# Patient Record
Sex: Male | Born: 1940
Health system: Southern US, Community
[De-identification: ages and names within clinical notes are randomized; demographics above are authoritative.]

## PROBLEM LIST (undated history)

## (undated) DIAGNOSIS — B182 Chronic viral hepatitis C: Secondary | ICD-10-CM

## (undated) DIAGNOSIS — Z87442 Personal history of urinary calculi: Secondary | ICD-10-CM

## (undated) DIAGNOSIS — I10 Essential (primary) hypertension: Secondary | ICD-10-CM

## (undated) DIAGNOSIS — D132 Benign neoplasm of duodenum: Secondary | ICD-10-CM

## (undated) DIAGNOSIS — Z973 Presence of spectacles and contact lenses: Secondary | ICD-10-CM

## (undated) DIAGNOSIS — E039 Hypothyroidism, unspecified: Secondary | ICD-10-CM

## (undated) DIAGNOSIS — E785 Hyperlipidemia, unspecified: Secondary | ICD-10-CM

## (undated) HISTORY — PX: JOINT REPLACEMENT: SHX530

## (undated) HISTORY — PX: LIVER BIOPSY: SHX301

## (undated) HISTORY — DX: Essential (primary) hypertension: I10

## (undated) HISTORY — DX: Chronic viral hepatitis C: B18.2

## (undated) HISTORY — DX: Hyperlipidemia, unspecified: E78.5

## (undated) HISTORY — PX: VASECTOMY: SHX75

## (undated) HISTORY — PX: OTHER SURGICAL HISTORY: SHX169

## (undated) HISTORY — PX: PROSTATE BIOPSY: SHX241

---
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
## 1983-01-19 HISTORY — PX: TOE FUSION: SHX1070

## 2009-11-27 DIAGNOSIS — Z8249 Family history of ischemic heart disease and other diseases of the circulatory system: Secondary | ICD-10-CM | POA: Insufficient documentation

## 2009-11-27 DIAGNOSIS — B1920 Unspecified viral hepatitis C without hepatic coma: Secondary | ICD-10-CM | POA: Insufficient documentation

## 2009-11-27 DIAGNOSIS — I2510 Atherosclerotic heart disease of native coronary artery without angina pectoris: Secondary | ICD-10-CM | POA: Insufficient documentation

## 2010-04-19 LAB — HM COLONOSCOPY

## 2011-08-14 DIAGNOSIS — IMO0001 Reserved for inherently not codable concepts without codable children: Secondary | ICD-10-CM | POA: Diagnosis not present

## 2011-10-06 ENCOUNTER — Ambulatory Visit (INDEPENDENT_AMBULATORY_CARE_PROVIDER_SITE_OTHER): Payer: Medicare Other | Admitting: Internal Medicine

## 2011-10-06 ENCOUNTER — Encounter: Payer: Self-pay | Admitting: Internal Medicine

## 2011-10-06 VITALS — BP 126/70 | HR 49 | Temp 98.4°F | Ht 74.0 in | Wt 230.0 lb

## 2011-10-06 DIAGNOSIS — I10 Essential (primary) hypertension: Secondary | ICD-10-CM | POA: Diagnosis not present

## 2011-10-06 DIAGNOSIS — B182 Chronic viral hepatitis C: Secondary | ICD-10-CM | POA: Diagnosis not present

## 2011-10-06 DIAGNOSIS — Z23 Encounter for immunization: Secondary | ICD-10-CM | POA: Diagnosis not present

## 2011-10-06 DIAGNOSIS — Z8619 Personal history of other infectious and parasitic diseases: Secondary | ICD-10-CM | POA: Insufficient documentation

## 2011-10-06 DIAGNOSIS — E785 Hyperlipidemia, unspecified: Secondary | ICD-10-CM | POA: Diagnosis not present

## 2011-10-06 DIAGNOSIS — N200 Calculus of kidney: Secondary | ICD-10-CM | POA: Insufficient documentation

## 2011-10-06 DIAGNOSIS — R972 Elevated prostate specific antigen [PSA]: Secondary | ICD-10-CM | POA: Insufficient documentation

## 2011-10-06 DIAGNOSIS — R030 Elevated blood-pressure reading, without diagnosis of hypertension: Secondary | ICD-10-CM | POA: Insufficient documentation

## 2011-10-06 LAB — HEPATIC FUNCTION PANEL
AST: 37 U/L (ref 0–37)
Albumin: 3.9 g/dL (ref 3.5–5.2)

## 2011-10-06 LAB — CBC WITH DIFFERENTIAL/PLATELET
Basophils Absolute: 0 10*3/uL (ref 0.0–0.1)
Eosinophils Absolute: 0.1 10*3/uL (ref 0.0–0.7)
Eosinophils Relative: 1.5 % (ref 0.0–5.0)
HCT: 47.9 % (ref 39.0–52.0)
Lymphs Abs: 2 10*3/uL (ref 0.7–4.0)
MCHC: 33.1 g/dL (ref 30.0–36.0)
MCV: 95.2 fl (ref 78.0–100.0)
Monocytes Absolute: 0.6 10*3/uL (ref 0.1–1.0)
Neutrophils Relative %: 56.3 % (ref 43.0–77.0)
Platelets: 155 10*3/uL (ref 150.0–400.0)
RDW: 13.4 % (ref 11.5–14.6)

## 2011-10-06 LAB — LIPID PANEL
Cholesterol: 143 mg/dL (ref 0–200)
Triglycerides: 59 mg/dL (ref 0.0–149.0)

## 2011-10-06 LAB — BASIC METABOLIC PANEL
BUN: 19 mg/dL (ref 6–23)
Chloride: 104 mEq/L (ref 96–112)
Creatinine, Ser: 1 mg/dL (ref 0.4–1.5)
Glucose, Bld: 100 mg/dL — ABNORMAL HIGH (ref 70–99)
Potassium: 5 mEq/L (ref 3.5–5.1)

## 2011-10-06 LAB — TSH: TSH: 2.54 u[IU]/mL (ref 0.35–5.50)

## 2011-10-06 NOTE — Assessment & Plan Note (Signed)
2 negative biopsies No further evaluation of any kind

## 2011-10-06 NOTE — Progress Notes (Signed)
Subjective:    Patient ID: Victor Ball, male    DOB: 1940/12/28, 71 y.o.   MRN: [id number]  HPI Moved here from [facility] there for 10 years after retiring from [facility] [employer] in [place] and [place] [employer] golf and tennis twice a week Remains very active  HTN goes back 10 years He wonders about need for meds Feels well No problems with meds  Hypercholesterolemia for some time Satisfied with the simvastatin Total 138 and LDL 67 about a year ago  Elevated PSA Biopsy done 18 months ago This was the second one----both were negative  Hepatitis C diagnosed in 1992 when he went to give blood (type 1) Never had symptoms Biopsies were reassuring twice Was not treated (though evaluated by specialist) Had stopped alcohol for 10 years---now back drinking 1glass of wine nightly  Kidney stone --had one about 4 years ago Passed but never got stone No recurrence  Current Outpatient Prescriptions on File Prior to Visit  Medication Sig Dispense Refill  . lisinopril (PRINIVIL,ZESTRIL) 10 MG tablet Take 10 mg by mouth daily.      . simvastatin (ZOCOR) 20 MG tablet Take 20 mg by mouth every evening.        No Known Allergies  Past Medical History  Diagnosis Date  . Hypertension   . Hyperlipidemia   . Kidney stone ~2009    never retrieved stone  . Hepatitis C, chronic     Past Surgical History  Procedure Date  . Appendectomy 1948  . Tonsillectomy and adenoidectomy 1945  . Toe fusion 1985    left great toe    Family History  Problem Relation Age of Onset  . Heart disease Father   . Diabetes Father   . Celiac disease Sister   . Stroke Brother   . Cancer Paternal Aunt   . Celiac disease Brother     History   Social History  . Marital Status: Married    Spouse Name: N/A    Number of Children: 2  . Years of Education: N/A   Occupational History  . 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 Smoking status: Former Smoker    Quit date: 01/18/1973  . Smokeless tobacco: Never Used  . Alcohol Use: Yes  . Drug Use: No  . Sexually Active: Not on file   Other Topics Concern  . Not on file   Social History Narrative   2 daughtersHas living willWife, then daughter, has health care POA.Would accept resuscitation attemptsNot sure about tube feeds   Review of Systems  Constitutional: Negative for fatigue and unexpected weight change.       Wears seat belt  HENT: Negative for hearing loss, dental problem and tinnitus.        Has needed some crowns removed---left teeth out Regular with dentist  Eyes: Negative for visual disturbance.       No diplopia or unilateral vision loss  Respiratory: Negative for cough, chest tightness and shortness of breath.   Cardiovascular: Negative for chest pain, palpitations and leg swelling.  Gastrointestinal: Negative for nausea, vomiting, abdominal pain, constipation and blood in stool.       Occ chokes on saliva when in recliner  Genitourinary: Negative for dysuria, frequency and difficulty urinating.       No sexual problems  Musculoskeletal: Negative for back pain, joint swelling and arthralgias.  Skin: Negative for rash.  Wife makes him go for yearly dermatology checks  Had ?spider bite in left groin 3 months ago---Rx with doxy Still not completely healed  Neurological: Negative for dizziness, syncope, weakness, light-headedness, numbness and headaches.  Psychiatric/Behavioral: Negative for disturbed wake/sleep cycle and dysphoric mood. The patient is not nervous/anxious.        Objective:   Physical Exam  Constitutional: He is oriented to person, place, and time. He appears well-developed and well-nourished. No distress.  HENT:  Mouth/Throat: Oropharynx is clear and moist. No oropharyngeal exudate.  Eyes: Conjunctivae normal and EOM are normal. Pupils are equal, round, and reactive to light.   Neck: Normal range of motion. Neck supple. No thyromegaly present.  Cardiovascular: Normal rate, regular rhythm, normal heart sounds and intact distal pulses.  Exam reveals no gallop.   No murmur heard. Pulmonary/Chest: Effort normal and breath sounds normal. No respiratory distress. He has no wheezes. He has no rales.  Abdominal: Soft. There is no tenderness.  Musculoskeletal: Normal range of motion. He exhibits no edema and no tenderness.  Lymphadenopathy:    He has no cervical adenopathy.  Neurological: He is alert and oriented to person, place, and time.  Skin: No rash noted.       2-41mm indurated hyperpigmented area in left lateral inguinal area  Psychiatric: He has a normal mood and affect. His behavior is normal. Thought content normal.          Assessment & Plan:

## 2011-10-06 NOTE — Assessment & Plan Note (Signed)
BP Readings from Last 3 Encounters:  10/06/11 126/70   No problems with the med but is low May consider stopping

## 2011-10-06 NOTE — Assessment & Plan Note (Signed)
Biopsies have been negative twice Will recheck No further action appropriate

## 2011-10-06 NOTE — Assessment & Plan Note (Signed)
If the LDL is again well under 100, will have him try without the med

## 2011-10-12 ENCOUNTER — Telehealth: Payer: Self-pay

## 2011-10-12 NOTE — Telephone Encounter (Signed)
Pt left v/m; pt received flus shot and was charged $42.00 admin fee. Pt said medicare would not cover. Left v/m for pt to call back.

## 2011-10-13 NOTE — Telephone Encounter (Signed)
Pt given billing # [phone]; pt will call and discuss with billing.

## 2011-10-18 ENCOUNTER — Encounter: Payer: Self-pay | Admitting: *Deleted

## 2012-01-17 ENCOUNTER — Other Ambulatory Visit: Payer: Self-pay | Admitting: Family Medicine

## 2012-01-17 MED ORDER — LISINOPRIL 10 MG PO TABS: 10.0000 mg | ORAL_TABLET | Freq: Every day | ORAL | Status: DC

## 2012-01-17 NOTE — Telephone Encounter (Signed)
Okay to fill for a year 

## 2012-01-17 NOTE — Telephone Encounter (Signed)
rx sent to pharmacy by e-script  

## 2012-01-17 NOTE — Telephone Encounter (Signed)
Pt left vm stating that he needs new RX for Lisinopril 10mg  tab.  He was seen on 10/06/11 as a new pt.  Please advise.

## 2012-04-07 ENCOUNTER — Ambulatory Visit: Payer: PRIVATE HEALTH INSURANCE | Admitting: Internal Medicine

## 2012-04-21 ENCOUNTER — Encounter: Payer: Self-pay | Admitting: Internal Medicine

## 2012-04-21 ENCOUNTER — Ambulatory Visit (INDEPENDENT_AMBULATORY_CARE_PROVIDER_SITE_OTHER): Payer: Medicare Other | Admitting: Internal Medicine

## 2012-04-21 VITALS — BP 120/80 | HR 55 | Temp 98.0°F | Wt 239.0 lb

## 2012-04-21 DIAGNOSIS — I10 Essential (primary) hypertension: Secondary | ICD-10-CM | POA: Diagnosis not present

## 2012-04-21 DIAGNOSIS — E785 Hyperlipidemia, unspecified: Secondary | ICD-10-CM

## 2012-04-21 DIAGNOSIS — B182 Chronic viral hepatitis C: Secondary | ICD-10-CM

## 2012-04-21 DIAGNOSIS — Z1331 Encounter for screening for depression: Secondary | ICD-10-CM | POA: Diagnosis not present

## 2012-04-21 NOTE — Progress Notes (Signed)
Subjective:    Patient ID: Victor Ball, male    DOB: [birth year], 72 y.o.   MRN: [id number]  HPI Doing well Did stop his statin---happy about that  Having left leg pain for about 1 year Daughter suggested he take wallet out of pants---seems to have helped Plans to get back to regular exercise Does play indoor tennis at [employer] slacked off on this  Still interested in stopping BP med No headache No chest pain No SOB No exertional dyspnea---even doubles tennis for 2 hours  Discussed the hep C Has had 2 biopsies that didn't show major fibrosis Avoids tylenol Only 1 glass of wine daily now  Current Outpatient Prescriptions on File Prior to Visit  Medication Sig Dispense Refill  . aspirin 81 MG tablet Take 81 mg by mouth daily.      . fish oil-omega-3 fatty acids 1000 MG capsule Take 2 g by mouth daily.      Marland Kitchen lisinopril (PRINIVIL,ZESTRIL) 10 MG tablet Take 1 tablet (10 mg total) by mouth daily.  90 tablet  3  . Multiple Vitamins-Minerals (MATURE ADULT CENTURY PO) Take by mouth daily.       No current facility-administered medications on file prior to visit.    No Known Allergies  Past Medical History  Diagnosis Date  . Hypertension   . Hyperlipidemia   . Kidney stone ~2009    never retrieved stone  . Hepatitis C, chronic     Past Surgical History  Procedure Laterality Date  . Appendectomy  1948  . Tonsillectomy and adenoidectomy  1945  . Toe fusion  1985    left great toe  . Vasectomy      Family History  Problem Relation Age of Onset  . Heart disease Father   . Diabetes Father   . Celiac disease Sister   . Stroke Brother   . Cancer Paternal Aunt   . Celiac disease Brother     History   Social History  . Marital Status: Married    Spouse Name: N/A    Number of Children: 2  . Years of Education: N/A   Occupational History  . 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 Smoking  status: Former Smoker    Quit date: 01/18/1973  . Smokeless tobacco: Never Used  . Alcohol Use: Yes  . Drug Use: No  . Sexually Active: Not on file   Other Topics Concern  . Not on file   Social History Narrative   2 daughters      Has living will   Wife, then daughter, has health care POA.   Would accept resuscitation attempts   Not sure about tube feeds   Review of Systems Gained 9#--was just in [place] and got "wined and dined" Hasn't been exercising as much---relates to less golf with weather and leg pain    Objective:   Physical Exam  Constitutional: He appears well-developed and well-nourished. No distress.  Neck: Normal range of motion. Neck supple. No thyromegaly present.  Cardiovascular: Normal rate, regular rhythm, normal heart sounds and intact distal pulses.  Exam reveals no gallop.   No murmur heard. Pulmonary/Chest: Effort normal and breath sounds normal. No respiratory distress. He has no wheezes. He has no rales.  Abdominal: Soft. There is no tenderness.  Musculoskeletal: He exhibits no edema and no tenderness.  Lymphadenopathy:    He has no cervical adenopathy.  Psychiatric: He has a  normal mood and affect. His behavior is normal.          Assessment & Plan:

## 2012-04-21 NOTE — Assessment & Plan Note (Signed)
BP Readings from Last 3 Encounters:  04/21/12 120/80  10/06/11 126/70   Good control still Will try off the med Discussed fitness  Some left leg pain--- will refer to PT if persist

## 2012-04-21 NOTE — Assessment & Plan Note (Signed)
Off the med now Will recheck labs at next visit

## 2012-04-21 NOTE — Assessment & Plan Note (Signed)
2 biopsies without sig fibrosis If LFTs stay okay, would not do any further testing

## 2012-04-28 ENCOUNTER — Ambulatory Visit: Payer: PRIVATE HEALTH INSURANCE | Admitting: Internal Medicine

## 2012-06-07 ENCOUNTER — Telehealth: Payer: Self-pay | Admitting: Internal Medicine

## 2012-06-07 ENCOUNTER — Ambulatory Visit (INDEPENDENT_AMBULATORY_CARE_PROVIDER_SITE_OTHER): Payer: Medicare Other | Admitting: Family Medicine

## 2012-06-07 ENCOUNTER — Encounter: Payer: Self-pay | Admitting: Family Medicine

## 2012-06-07 VITALS — BP 146/86 | HR 60 | Temp 98.2°F | Ht 74.0 in | Wt 227.8 lb

## 2012-06-07 DIAGNOSIS — J209 Acute bronchitis, unspecified: Secondary | ICD-10-CM

## 2012-06-07 MED ORDER — AZITHROMYCIN 250 MG PO TABS: ORAL_TABLET | ORAL | Status: DC

## 2012-06-07 MED ORDER — ALBUTEROL SULFATE HFA 108 (90 BASE) MCG/ACT IN AERS: 2.0000 | INHALATION_SPRAY | RESPIRATORY_TRACT | Status: DC | PRN

## 2012-06-07 MED ORDER — PREDNISONE 10 MG PO TABS: ORAL_TABLET | ORAL | Status: DC

## 2012-06-07 NOTE — Telephone Encounter (Signed)
Patient Information:  Caller Name: Argel  Phone: [phone]  Patient: Lane, Eland  Gender: Male  DOB: 02/20/1940  Age: 72 Years  PCP: Tillman Abide [facility])  Office Follow Up:  Does the office need to follow up with this patient?: No  Instructions For The Office: N/A  RN Note:  Patient states he developed cough, congestion. Spouse reports that patient has wheezing at night. States he feels like he developed a fever, per tactile, 06/06/12. Patient does not have a thermometer. States expectorating yellow sputum. Patient able to speak in full sentences without difficulty. Care advice given per guidelines. Call back parameters reviewed. Patient verbalizes understanding.  Symptoms  Reason For Call & Symptoms: Cough, congestion, wheezing  Reviewed Health History In EMR: Yes  Reviewed Medications In EMR: Yes  Reviewed Allergies In EMR: Yes  Reviewed Surgeries / Procedures: Yes  Date of Onset of Symptoms: 05/31/2012  Treatments Tried: Tussin DM q 4 hours  Treatments Tried Worked: No  Any Fever: Yes  Fever Taken: Tactile  Fever Time Of Reading: 08:00:00  Fever Last Reading: N/A  Guideline(s) Used:  Colds  Disposition Per Guideline:   See Today or Tomorrow in Office  Reason For Disposition Reached:   Patient wants to be seen  Advice Given:  Humidifier:  If the air in your home is dry, use a cool-mist humidifier  Treatment for Associated Symptoms of Colds:  Hydrate: Drink adequate liquids.  Call Back If:  Difficulty breathing occurs  You become worse  Patient Will Follow Care Advice:  YES  Appointment Scheduled:  06/07/2012 15:15:00 Appointment Scheduled Provider:  R[facility])

## 2012-06-07 NOTE — Progress Notes (Signed)
Subjective:    Patient ID: Victor Ball, male    DOB: [birth year], 72 y.o.   MRN: [id number]  HPI Here with uri symptoms   He is generally very healthy  Last week this started with chest congestion- noticed some cough - stayed active all week  By Sunday stared to feel tired out  Monday - wheezed when he slept (per wife)- and this is unusual for him  Felt feverish - had sweats at 3 am   Phlegm he coughs up is clear to yellow - small amount  Just won't loosen up  He tried some tussin DM   As a kid he had bronchitis at least once per year -no asthma   Some post nasal drip   Patient Active Problem List   Diagnosis Date Noted  . Elevated PSA 10/06/2011  . Hypertension   . Hyperlipidemia   . Kidney stone   . Hepatitis C, chronic    Past Medical History  Diagnosis Date  . Hypertension   . Hyperlipidemia   . Kidney stone ~2009    never retrieved stone  . Hepatitis C, chronic    Past Surgical History  Procedure Laterality Date  . Appendectomy  1948  . Tonsillectomy and adenoidectomy  1945  . Toe fusion  1985    left great toe  . Vasectomy     History  Substance Use Topics  . Smoking status: Former Smoker    Quit date: 01/18/1973  . Smokeless tobacco: Never Used  . Alcohol Use: Yes     Comment: occ   Family History  Problem Relation Age of Onset  . Heart disease Father   . Diabetes Father   . Celiac disease Sister   . Stroke Brother   . Cancer Paternal Aunt   . Celiac disease Brother    No Known Allergies Current Outpatient Prescriptions on File Prior to Visit  Medication Sig Dispense Refill  . aspirin 81 MG tablet Take 81 mg by mouth daily.      . fish oil-omega-3 fatty acids 1000 MG capsule Take 2 g by mouth daily.      . Multiple Vitamins-Minerals (MATURE ADULT CENTURY PO) Take by mouth daily.       No current facility-administered medications on file prior to visit.     Review of Systems Review of Systems  Constitutional: Negative for fever, appetite  change,  and unexpected weight change.  ENT pos for post nasal drip/ rhinorrhea/ neg for ear pain or sinus pain  Eyes: Negative for pain and visual disturbance.  Respiratory: Negative for  shortness of breath.   Cardiovascular: Negative for cp or palpitations    Gastrointestinal: Negative for nausea, diarrhea and constipation.  Genitourinary: Negative for urgency and frequency.  Skin: Negative for pallor or rash   Neurological: Negative for weakness, light-headedness, numbness and headaches.  Hematological: Negative for adenopathy. Does not bruise/bleed easily.  Psychiatric/Behavioral: Negative for dysphoric mood. The patient is not nervous/anxious.         Objective:   Physical Exam  Constitutional: He appears well-developed and well-nourished. No distress.  HENT:  Head: Normocephalic and atraumatic.  Right Ear: External ear normal.  Left Ear: External ear normal.  Mouth/Throat: Oropharynx is clear and moist.  Nares are boggy  Clear post nasal drip  No sinus tenderness  Eyes: Conjunctivae and EOM are normal. Pupils are equal, round, and reactive to light. Right eye exhibits no discharge. Left eye exhibits no discharge. No scleral icterus.  Neck: Normal  range of motion. Neck supple. No JVD present. Carotid bruit is not present. No thyromegaly present.  Cardiovascular: Normal rate, regular rhythm, normal heart sounds and intact distal pulses.  Exam reveals no gallop.   Pulmonary/Chest: Effort normal. No respiratory distress. He has wheezes. He has no rales. He exhibits no tenderness.  Diffuse exp wheeze with few rhonchi No prolonged exp time  Abdominal: He exhibits no abdominal bruit.  Lymphadenopathy:    He has no cervical adenopathy.  Skin: Skin is warm and dry. No rash noted.  Psychiatric: He has a normal mood and affect.          Assessment & Plan:

## 2012-06-07 NOTE — Telephone Encounter (Signed)
I will see him then

## 2012-06-07 NOTE — Assessment & Plan Note (Signed)
Cover with zithromax as directed  Prednisone 30 mg taper for wheeze and albuterol MDI -- info given on technique and this was reviewed  Adv to get mucinex DM for cough  Disc symptomatic care - see instructions on AVS  Update if not starting to improve in a week or if worsening

## 2012-06-07 NOTE — Patient Instructions (Addendum)
Drink lots of fluids and get some extra rest  Take the zithromax as directed for bronchitis Take prednisone as directed for wheezing -taper the dose as directed  Albuterol inhaler- you can use as needed  mucinex DM over the counter is good for cough and congestion Update if not starting to improve in a week or if worsening

## 2012-06-13 ENCOUNTER — Encounter: Payer: Self-pay | Admitting: Internal Medicine

## 2012-06-13 ENCOUNTER — Ambulatory Visit (INDEPENDENT_AMBULATORY_CARE_PROVIDER_SITE_OTHER): Payer: Medicare Other | Admitting: Internal Medicine

## 2012-06-13 ENCOUNTER — Telehealth: Payer: Self-pay | Admitting: Internal Medicine

## 2012-06-13 ENCOUNTER — Ambulatory Visit (INDEPENDENT_AMBULATORY_CARE_PROVIDER_SITE_OTHER)
Admission: RE | Admit: 2012-06-13 | Discharge: 2012-06-13 | Disposition: A | Discharge status: Home / Self Care | Payer: Medicare Other | Source: Ambulatory Visit | Attending: Internal Medicine | Admitting: Internal Medicine

## 2012-06-13 VITALS — BP 130/80 | HR 60 | Temp 97.9°F | Resp 10 | Wt 227.0 lb

## 2012-06-13 DIAGNOSIS — J209 Acute bronchitis, unspecified: Secondary | ICD-10-CM

## 2012-06-13 DIAGNOSIS — R062 Wheezing: Secondary | ICD-10-CM | POA: Diagnosis not present

## 2012-06-13 DIAGNOSIS — R05 Cough: Secondary | ICD-10-CM | POA: Diagnosis not present

## 2012-06-13 DIAGNOSIS — R059 Cough, unspecified: Secondary | ICD-10-CM | POA: Diagnosis not present

## 2012-06-13 MED ORDER — PREDNISONE 20 MG PO TABS: 40.0000 mg | ORAL_TABLET | Freq: Every day | ORAL | Status: DC

## 2012-06-13 MED ORDER — DOXYCYCLINE HYCLATE 100 MG PO TABS: 100.0000 mg | ORAL_TABLET | Freq: Two times a day (BID) | ORAL | Status: DC

## 2012-06-13 NOTE — Telephone Encounter (Signed)
Will evaluate at OV 

## 2012-06-13 NOTE — Assessment & Plan Note (Addendum)
Persistent tight cough CXR appears normal---waiting for radiologist  Will try prednisone at higher dose Doxycycline as well

## 2012-06-13 NOTE — Progress Notes (Signed)
Subjective:    Patient ID: Victor Ball, male    DOB: [birth year], 72 y.o.   MRN: [id number]  HPI First time he has been sick in 15 years Fought off the infection for 6 days---then finally came in last week Finished the azithromycin-- still using inhaler and prednisone  May have improved slightly after 4 days but then persisting Has been taking it easy Tight cough with occasional yellow sputum  No SOB Some wheezing at night---wife notices No fever but occ sweats  Current Outpatient Prescriptions on File Prior to Visit  Medication Sig Dispense Refill  . albuterol (PROVENTIL HFA;VENTOLIN HFA) 108 (90 BASE) MCG/ACT inhaler Inhale 2 puffs into the lungs every 4 (four) hours as needed for wheezing.  1 Inhaler  1  . aspirin 81 MG tablet Take 81 mg by mouth daily.      . fish oil-omega-3 fatty acids 1000 MG capsule Take 2 g by mouth daily.      . Multiple Vitamins-Minerals (MATURE ADULT CENTURY PO) Take by mouth daily.      . predniSONE (DELTASONE) 10 MG tablet Take 3 pills once daily by mouth for 3 days, then 2 pills once daily for 3 days, then 1 pill once daily for 3 days and then stop  18 tablet  0   No current facility-administered medications on file prior to visit.    No Known Allergies  Past Medical History  Diagnosis Date  . Hypertension   . Hyperlipidemia   . Kidney stone ~2009    never retrieved stone  . Hepatitis C, chronic     Past Surgical History  Procedure Laterality Date  . Appendectomy  1948  . Tonsillectomy and adenoidectomy  1945  . Toe fusion  1985    left great toe  . Vasectomy      Family History  Problem Relation Age of Onset  . Heart disease Father   . Diabetes Father   . Celiac disease Sister   . Stroke Brother   . Cancer Paternal Aunt   . Celiac disease Brother     History   Social History  . Marital Status: Married    Spouse Name: N/A    Number of Children: 2  . Years of Education: N/A   Occupational History  . 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 Smoking status: Former Smoker    Quit date: 01/18/1973  . Smokeless tobacco: Never Used  . Alcohol Use: Yes     Comment: occ  . Drug Use: No  . Sexually Active: Not on file   Other Topics Concern  . Not on file   Social History Narrative   2 daughters      Has living will   Wife, then daughter, has health care POA.   Would accept resuscitation attempts   Not sure about tube feeds   Review of Systems No abdominal pain No vomiting or diarrhea Appetite is still okay No heartburn     Objective:   Physical Exam  Constitutional: He appears well-developed and well-nourished. No distress.  Coarse tight cough intermittently  HENT:  Nose: Nose normal.  Mouth/Throat: Oropharynx is clear and moist. No oropharyngeal exudate.  TMs normal  Neck: Normal range of motion. Neck supple. No thyromegaly present.  Pulmonary/Chest: Effort normal. No respiratory distress. He has wheezes. He has no rales.  Slight expiratory wheezes in upper lobes  Lymphadenopathy:    He has  no cervical adenopathy.          Assessment & Plan:

## 2012-06-13 NOTE — Telephone Encounter (Signed)
Patient Information:  Caller Name: Landy  Phone: [phone]  Patient: Victor Ball, Victor Ball  Gender: Male  DOB: 10/09/1940  Age: 72 Years  PCP: Tillman Abide [facility])  Office Follow Up:  Does the office need to follow up with this patient?: No  Instructions For The Office: N/A   Symptoms  Reason For Call & Symptoms: Patient reports he was seen 06/07/12, diagnosed with bronchitis; on Azithromycin, Prednisone,  Albuterol Inhaler, Mucinex DM, Cough Syrup without relief.  Yellow sputum persists.  Emergent symptoms ruled out.  Go to Office Now per Cough protocol.  Reviewed Health History In EMR: Yes  Reviewed Medications In EMR: Yes  Reviewed Allergies In EMR: Yes  Reviewed Surgeries / Procedures: Yes  Date of Onset of Symptoms: 06/01/2012  Treatments Tried: As ordered  Treatments Tried Worked: No  Guideline(s) Used:  Cough  Disposition Per Guideline:   Go to Office Now  Reason For Disposition Reached:   Wheezing is present  Advice Given:  Reassurance  Coughing is the way that our lungs remove irritants and mucus. It helps protect our lungs from getting pneumonia.  Coughing Spasms:  Drink warm fluids. Inhale warm mist (Reason: both relax the airway and loosen up the phlegm).  Suck on cough drops or hard candy to coat the irritated throat.  Prevent Dehydration:  Drink adequate liquids.  Call Back If:  You become worse.  RN Overrode Recommendation:  Make Appointment  See ASAP  Appointment Scheduled:  06/13/2012 10:15:00 Appointment Scheduled Provider:  Tillman Abide [facility])

## 2012-06-14 ENCOUNTER — Encounter: Payer: Self-pay | Admitting: *Deleted

## 2012-06-16 ENCOUNTER — Ambulatory Visit (INDEPENDENT_AMBULATORY_CARE_PROVIDER_SITE_OTHER): Payer: Medicare Other | Admitting: *Deleted

## 2012-06-16 DIAGNOSIS — Z23 Encounter for immunization: Secondary | ICD-10-CM

## 2012-07-05 ENCOUNTER — Other Ambulatory Visit: Payer: Self-pay | Admitting: Family Medicine

## 2012-10-04 ENCOUNTER — Ambulatory Visit (INDEPENDENT_AMBULATORY_CARE_PROVIDER_SITE_OTHER)
Admission: RE | Admit: 2012-10-04 | Discharge: 2012-10-04 | Disposition: A | Discharge status: Home / Self Care | Payer: Medicare Other | Source: Ambulatory Visit | Attending: Internal Medicine | Admitting: Internal Medicine

## 2012-10-04 ENCOUNTER — Encounter: Payer: Self-pay | Admitting: Internal Medicine

## 2012-10-04 ENCOUNTER — Ambulatory Visit (INDEPENDENT_AMBULATORY_CARE_PROVIDER_SITE_OTHER): Payer: Medicare Other | Admitting: Internal Medicine

## 2012-10-04 VITALS — BP 138/70 | HR 54 | Temp 98.2°F | Wt 228.0 lb

## 2012-10-04 DIAGNOSIS — Z23 Encounter for immunization: Secondary | ICD-10-CM | POA: Diagnosis not present

## 2012-10-04 DIAGNOSIS — M25559 Pain in unspecified hip: Secondary | ICD-10-CM | POA: Diagnosis not present

## 2012-10-04 DIAGNOSIS — M1610 Unilateral primary osteoarthritis, unspecified hip: Secondary | ICD-10-CM | POA: Diagnosis not present

## 2012-10-04 DIAGNOSIS — M169 Osteoarthritis of hip, unspecified: Secondary | ICD-10-CM | POA: Diagnosis not present

## 2012-10-04 DIAGNOSIS — M25552 Pain in left hip: Secondary | ICD-10-CM

## 2012-10-04 NOTE — Progress Notes (Signed)
  Subjective:    Patient ID: Stancil Deisher, male    DOB: 12-Feb-1940, 72 y.o.   MRN: [id number]  HPI Noted some pain along left leg ~18 months ago Took wallet out of back pocket and it seemed to get better  Now with pain at lateral left hip for 2-3 weeks Awakens in AM at times Still plays tennis and golf ---it may be present then but did get worse during golf yesterday Doesn't radiate  Hasn't been using ibuprofen---seems to get better on its own Tries to take breaks if he is driving Hasn't tried heat or ice  Current Outpatient Prescriptions on File Prior to Visit  Medication Sig Dispense Refill  . aspirin 81 MG tablet Take 81 mg by mouth daily.      . fish oil-omega-3 fatty acids 1000 MG capsule Take 2 g by mouth daily.      . Multiple Vitamins-Minerals (MATURE ADULT CENTURY PO) Take by mouth daily.       No current facility-administered medications on file prior to visit.    No Known Allergies  Past Medical History  Diagnosis Date  . Hypertension   . Hyperlipidemia   . Kidney stone ~2009    never retrieved stone  . Hepatitis C, chronic     Past Surgical History  Procedure Laterality Date  . Appendectomy  1948  . Tonsillectomy and adenoidectomy  1945  . Toe fusion  1985    left great toe  . Vasectomy      Family History  Problem Relation Age of Onset  . Heart disease Father   . Diabetes Father   . Celiac disease Sister   . Stroke Brother   . Cancer Paternal Aunt   . Celiac disease Brother     History   Social History  . Marital Status: Married    Spouse Name: N/A    Number of Children: 2  . Years of Education: N/A   Occupational History  . 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 Smoking status: Former Smoker    Quit date: 01/18/1973  . Smokeless tobacco: Never Used  . Alcohol Use: Yes     Comment: occ  . Drug Use: No  . Sexual Activity: Not on file   Other Topics Concern  . Not on file    Social History Narrative   2 daughters      Has living will   Wife, then daughter, has health care POA.   Would accept resuscitation attempts   Not sure about tube feeds   Review of Systems No weakness---but gets stabs of pain at times Back is fine     Objective:   Physical Exam  Constitutional: He appears well-developed and well-nourished. No distress.  Abdominal:  Tenderness in femoral area--just medial to anterior superior iliac spine  Musculoskeletal:  No left hip or bursa tenderness Normal ROM of hips No back tenderness          Assessment & Plan:

## 2012-10-04 NOTE — Addendum Note (Signed)
Addended by: Sueanne Margarita on: 10/04/2012 10:59 AM   Modules accepted: Orders

## 2012-10-04 NOTE — Patient Instructions (Signed)
Take it easy for 2 weeks. If the pain isn't better, call for referral to see a surgeon.

## 2012-10-04 NOTE — Assessment & Plan Note (Signed)
Doesn't seem to be orthopedic ?femoral hernia or strain  Will just check x-ray Consider surgical evaluation after 2 weeks of reduced activity (just walking instead of golf/tennis)

## 2012-10-24 ENCOUNTER — Encounter: Payer: Self-pay | Admitting: Internal Medicine

## 2012-10-24 ENCOUNTER — Ambulatory Visit (INDEPENDENT_AMBULATORY_CARE_PROVIDER_SITE_OTHER): Payer: Medicare Other | Admitting: Internal Medicine

## 2012-10-24 VITALS — BP 122/90 | HR 52 | Temp 97.4°F | Ht 74.0 in | Wt 230.0 lb

## 2012-10-24 DIAGNOSIS — B182 Chronic viral hepatitis C: Secondary | ICD-10-CM

## 2012-10-24 DIAGNOSIS — I10 Essential (primary) hypertension: Secondary | ICD-10-CM | POA: Diagnosis not present

## 2012-10-24 DIAGNOSIS — M25559 Pain in unspecified hip: Secondary | ICD-10-CM | POA: Diagnosis not present

## 2012-10-24 DIAGNOSIS — M25552 Pain in left hip: Secondary | ICD-10-CM

## 2012-10-24 DIAGNOSIS — E785 Hyperlipidemia, unspecified: Secondary | ICD-10-CM

## 2012-10-24 DIAGNOSIS — Z23 Encounter for immunization: Secondary | ICD-10-CM

## 2012-10-24 DIAGNOSIS — Z0000 Encounter for general adult medical examination without abnormal findings: Secondary | ICD-10-CM | POA: Diagnosis not present

## 2012-10-24 LAB — LIPID PANEL
HDL: 48.3 mg/dL (ref >39.00)
Triglycerides: 61 mg/dL (ref 0.0–149.0)

## 2012-10-24 LAB — HEPATIC FUNCTION PANEL
ALT: 57 U/L — ABNORMAL HIGH (ref 0–53)
Bilirubin, Direct: 0.1 mg/dL (ref 0.0–0.3)
Total Protein: 7.4 g/dL (ref 6.0–8.3)

## 2012-10-24 LAB — CBC WITH DIFFERENTIAL/PLATELET
Basophils Absolute: 0 10*3/uL (ref 0.0–0.1)
Basophils Relative: 0.4 % (ref 0.0–3.0)
Eosinophils Absolute: 0.1 10*3/uL (ref 0.0–0.7)
Hemoglobin: 17.3 g/dL — ABNORMAL HIGH (ref 13.0–17.0)
Lymphocytes Relative: 31.2 % (ref 12.0–46.0)
MCHC: 34 g/dL (ref 30.0–36.0)
MCV: 92.7 fl (ref 78.0–100.0)
Monocytes Absolute: 0.5 10*3/uL (ref 0.1–1.0)
Neutro Abs: 3.5 10*3/uL (ref 1.4–7.7)
Neutrophils Relative %: 58.1 % (ref 43.0–77.0)
RDW: 13.8 % (ref 11.5–14.6)

## 2012-10-24 LAB — BASIC METABOLIC PANEL
CO2: 30 mEq/L (ref 19–32)
Calcium: 9.4 mg/dL (ref 8.4–10.5)
Chloride: 106 mEq/L (ref 96–112)
Creatinine, Ser: 1 mg/dL (ref 0.4–1.5)
Glucose, Bld: 110 mg/dL — ABNORMAL HIGH (ref 70–99)

## 2012-10-24 NOTE — Patient Instructions (Signed)
Please set up an appointment with Dr Patsy Lager to evaluate your left hip

## 2012-10-24 NOTE — Assessment & Plan Note (Signed)
Just on fish oil Will recheck

## 2012-10-24 NOTE — Progress Notes (Signed)
Subjective:    Patient ID: Victor Ball, male    DOB: 06-Jan-1941, 72 y.o.   MRN: [id number]  HPI Here for Medicare wellness and follow up No falls No depression or anhedonia Reviewed advanced directives Occasional alcohol No nicotine No other doctors No hearing or vision problems No cognitive problems  Hip is no better Feels stiff and notices it when he puts his pants on in the morning Tried some ibuprofen-- maybe 4 times. No clear improvement Has abstained from golf and tennis---no improvement (this is when the pain was bad though) Ready for referral  Hasn't been taking his BP No headaches No chest pain  No SOB  Off the cholesterol med for a year Interested in what it is now  Current Outpatient Prescriptions on File Prior to Visit  Medication Sig Dispense Refill  . aspirin 81 MG tablet Take 81 mg by mouth daily.      . fish oil-omega-3 fatty acids 1000 MG capsule Take 2 g by mouth daily.      . Multiple Vitamins-Minerals (MATURE ADULT CENTURY PO) Take by mouth daily.       No current facility-administered medications on file prior to visit.    No Known Allergies  Past Medical History  Diagnosis Date  . Hypertension   . Hyperlipidemia   . Kidney stone ~2009    never retrieved stone  . Hepatitis C, chronic     Past Surgical History  Procedure Laterality Date  . Appendectomy  1948  . Tonsillectomy and adenoidectomy  1945  . Toe fusion  1985    left great toe  . Vasectomy      Family History  Problem Relation Age of Onset  . Heart disease Father   . Diabetes Father   . Celiac disease Sister   . Stroke Brother   . Heart disease Brother     MI then stents  . Cancer Paternal Aunt   . Celiac disease Brother     History   Social History  . Marital Status: Married    Spouse Name: N/A    Number of Children: 2  . Years of Education: N/A   Occupational History  . 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 Smoking status: Former Smoker    Quit date: 01/18/1973  . Smokeless tobacco: Never Used  . Alcohol Use: Yes     Comment: occ  . Drug Use: No  . Sexual Activity: Not on file   Other Topics Concern  . Not on file   Social History Narrative   2 daughters      Has living will   Wife, then daughter, has health care POA.   Would accept resuscitation attempts   Not sure about tube feeds   Review of Systems Sleeps well Weight is stable Bowels are fine    Objective:   Physical Exam  Constitutional: He is oriented to person, place, and time. He appears well-developed and well-nourished. No distress.  HENT:  Mouth/Throat: Oropharynx is clear and moist. No oropharyngeal exudate.  Neck: Normal range of motion. Neck supple. No thyromegaly present.  Cardiovascular: Normal rate, regular rhythm, normal heart sounds and intact distal pulses.  Exam reveals no gallop.   No murmur heard. Pulmonary/Chest: Effort normal and breath sounds normal. No respiratory distress. He has no wheezes. He has no rales.  Abdominal: Soft. There is no tenderness.  Musculoskeletal: He exhibits no edema and no  tenderness.  Lymphadenopathy:    He has no cervical adenopathy.  Neurological: He is alert and oriented to person, place, and time.  President-- "Thomasene Mohair, [place]" [id number] Recall 3/3  Skin: No rash noted. No erythema.  Psychiatric: He has a normal mood and affect. His behavior is normal.          Assessment & Plan:

## 2012-10-24 NOTE — Assessment & Plan Note (Signed)
Persists Not clear if this is arthritic Will set up with Dr Patsy Lager

## 2012-10-24 NOTE — Assessment & Plan Note (Signed)
I have personally reviewed the Medicare Annual Wellness questionnaire and have noted 1. The patient's medical and social history 2. Their use of alcohol, tobacco or illicit drugs 3. Their current medications and supplements 4. The patient's functional ability including ADL's, fall risks, home safety risks and hearing or visual             impairment. 5. Diet and physical activities 6. Evidence for depression or mood disorders  The patients weight, height, BMI and visual acuity have been recorded in the chart I have made referrals, counseling and provided education to the patient based review of the above and I have provided the pt with a written personalized care plan for preventive services.  I have provided you with a copy of your personalized plan for preventive services. Please take the time to review along with your updated medication list.  Doing well Will update Td prevnar next visit (just had flu shot)

## 2012-10-24 NOTE — Addendum Note (Signed)
Addended by: Sueanne Margarita on: 10/24/2012 11:51 AM   Modules accepted: Orders

## 2012-10-24 NOTE — Assessment & Plan Note (Signed)
BP Readings from Last 3 Encounters:  10/24/12 122/90  10/04/12 138/70  06/13/12 130/80   Doing fine without meds

## 2012-10-24 NOTE — Assessment & Plan Note (Signed)
Will check liver tests and viral load

## 2012-10-25 LAB — HEPATITIS C RNA QUANTITATIVE
HCV Quantitative Log: 6.19 {Log} — ABNORMAL HIGH (ref <1.18)
HCV Quantitative: 1540740 IU/mL — ABNORMAL HIGH (ref <15)

## 2012-10-27 ENCOUNTER — Encounter: Payer: Medicare Other | Admitting: Internal Medicine

## 2012-10-30 ENCOUNTER — Telehealth: Payer: Self-pay | Admitting: Internal Medicine

## 2012-10-30 ENCOUNTER — Ambulatory Visit (INDEPENDENT_AMBULATORY_CARE_PROVIDER_SITE_OTHER): Payer: Medicare Other | Admitting: Family Medicine

## 2012-10-30 ENCOUNTER — Encounter: Payer: Self-pay | Admitting: Family Medicine

## 2012-10-30 VITALS — BP 142/80 | HR 57 | Temp 98.2°F | Ht 74.0 in | Wt 237.2 lb

## 2012-10-30 DIAGNOSIS — M76899 Other specified enthesopathies of unspecified lower limb, excluding foot: Secondary | ICD-10-CM

## 2012-10-30 DIAGNOSIS — M169 Osteoarthritis of hip, unspecified: Secondary | ICD-10-CM | POA: Diagnosis not present

## 2012-10-30 DIAGNOSIS — M7062 Trochanteric bursitis, left hip: Secondary | ICD-10-CM

## 2012-10-30 DIAGNOSIS — M160 Bilateral primary osteoarthritis of hip: Secondary | ICD-10-CM

## 2012-10-30 DIAGNOSIS — M1610 Unilateral primary osteoarthritis, unspecified hip: Secondary | ICD-10-CM

## 2012-10-30 NOTE — Patient Instructions (Signed)
REFERRAL: GO THE THE FRONT ROOM AT THE ENTRANCE OF OUR CLINIC, NEAR CHECK IN. ASK FOR MARION. SHE WILL HELP YOU SET UP YOUR REFERRAL. DATE: TIME:  

## 2012-10-30 NOTE — Progress Notes (Signed)
Date:  10/30/2012   Name:  Victor Ball   DOB:  1940-11-06   MRN:  [id number] Gender: male Age: 72 y.o.  Primary Physician: Tillman Abide, MD  Dear Dr. Alphonsus Sias,  Thank you for having me see Victor Ball in consultation today at [facility] at [facility] for his problem with left greater than right hip pain..  As you may recall, he is a 72 y.o. year old male with a history of hepatitis C, and he is quite active in a routine bike rider as well as a walker and hiker. He has had some persistent lateral hip pain on the left.  Approximately 2 years ago, he moved to this area from [place], and he was having some sciatica on the left side. When he took out his wallet on the left side, his sciatica symptoms resolved.  Currently he is primarily having pain on the lateral aspect of his hip to near the greater trochanter. He denies groin pain. He denies much significant back pain. He is limited somewhat right now in his ability to hike in particular. He also plays some tennis, and his hip is limited in this as well.   Likes to hike, walk, he is a bike rider.   Left hip. Never had any issues with left hip until about 2 years ago when moved from [place] to [place]. He would get some sciatica. Took out his wallet.  Past Medical History  Diagnosis Date  . Hypertension   . Hyperlipidemia   . Kidney stone ~2009    never retrieved stone  . Hepatitis C, chronic     Past Surgical History  Procedure Laterality Date  . Appendectomy  1948  . Tonsillectomy and adenoidectomy  1945  . Toe fusion  1985    left great toe  . Vasectomy      History   Social History  . Marital Status: Married    Spouse Name: N/A    Number of Children: 2  . Years of Education: N/A   Occupational History  . 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 Smoking status: Former Smoker    Quit date: 01/18/1973  . Smokeless tobacco: Never Used  .  Alcohol Use: Yes     Comment: occ  . Drug Use: No  . Sexual Activity: None   Other Topics Concern  . None   Social History Narrative   2 daughters      Has living will   Wife, then daughter, has health care POA.   Would accept resuscitation attempts   Not sure about tube feeds    Family History  Problem Relation Age of Onset  . Heart disease Father   . Diabetes Father   . Celiac disease Sister   . Stroke Brother   . Heart disease Brother     MI then stents  . Cancer Paternal Aunt   . Celiac disease Brother     Medications and Allergies reviewed  Outpatient Prescriptions Prior to Visit  Medication Sig Dispense Refill  . aspirin 81 MG tablet Take 81 mg by mouth daily.      . fish oil-omega-3 fatty acids 1000 MG capsule Take 2 g by mouth daily.      . Multiple Vitamins-Minerals (MATURE ADULT CENTURY PO) Take by mouth daily.       No facility-administered medications prior to visit.    Review of Systems:  GEN: No fevers, chills. Nontoxic. Primarily MSK c/o today. MSK: Detailed in the HPI GI: tolerating PO intake without difficulty Neuro: No numbness, parasthesias, or tingling associated. Otherwise the pertinent positives of the ROS are noted above.    Physical Examination: Filed Vitals:   10/30/12 0902  BP: 142/80  Pulse: 57  Temp: 98.2 F (36.8 C)  TempSrc: Oral  Height: 6\' 2"  (1.88 m)  Weight: 237 lb 4 oz (107.616 kg)      GEN: WDWN, NAD, Non-toxic, Alert & Oriented x 3 HEENT: Atraumatic, Normocephalic.  Ears and Nose: No external deformity. EXTR: No clubbing/cyanosis/edema NEURO: Normal gait.  PSYCH: Normally interactive. Conversant. Not depressed or anxious appearing.  Calm demeanor.   HIP EXAM: SIDE: b ROM: Abduction, Flexion, Internal and External range of motion: Dubois loss of abduction, with only abduction bilaterally to approximately 40. Internal and external range of motion is limited on either side with the hip flexed to 90, there is  approximately 45 of total range of motion at the hip and internal and external rotation. There is minimal pain with terminal internal range of motion Pain with terminal IROM and EROM: minimal GTB: NOTABLE ON THE LEFT SLR: NEG Knees: No effusion FABER: NT REVERSE FABER: NT, neg Piriformis: NT at direct palpation Str: flexion: 5/5 abduction: 5/5 adduction: 5/5 Strength testing non-tender  Dg Hip Complete Left  10/04/2012   CLINICAL DATA:  Left medial hip pain.  EXAM: LEFT HIP - COMPLETE 2+ VIEW  COMPARISON:  None.  FINDINGS: Moderate to advanced degenerative changes in the hips bilaterally with joint space narrowing, spurring and subchondral sclerosis. SI joints are symmetric and unremarkable. No acute bony abnormality. Specifically, no fracture, subluxation, or dislocation. Soft tissues are intact.  IMPRESSION: Moderate to advanced symmetric degenerative joint disease in the hips bilaterally. No acute findings.   Electronically Signed   By: Charlett Nose M.D.   On: 10/04/2012 10:54    Independently reviewed, self. I do agree that bilaterally there is at the very least moderate osteoarthritis in each hip joint towards severe. No evidence of occult fracture.  Assessment and Plan:  Impression: 1. Left very symptomatic trochanteric bursitis. 2. Moderate to severe bilateral hip OA, which may be the cause of #1 as altered movement. Seems not that symptomatic right now.   Recommendations: 1. PT for hip ROM and strengthening. 2. GTB injection  Trochanteric Bursitis Injection, LEFT Verbal consent obtained. Risks (including infection, potential atrophy), benefits, and alternatives reviewed. Greater trochanter sterilely prepped with Chloraprep. Ethyl Chloride used for anesthesia. 8 cc of Lidocaine 1% injected with 2 cc of 40 mg Depo-Medrol into trochanteric bursa at area of maximal tenderness at greater trochanter. Needle taken to bone to troch bursa, flows easily. Bursa massaged. No bleeding and no  complications. Decreased pain after injection. Needle: 22 gauge spinal needle  We will see the patient back prn only. The majority of these cases do quite well.  Thank you for having [place] see Victor Ball in consultation.  Feel free to contact me with any questions.  Signed,  Elpidio Galea. Belmira Daley, MD, CAQ Sports Medicine  [employer] [facility] at [facility] [address] [place], [place] [address] Phone: [phone] Fax: [phone]

## 2012-10-30 NOTE — Telephone Encounter (Signed)
The patient was hoping to get his referral for physical therapy, thanks!

## 2012-10-31 DIAGNOSIS — M160 Bilateral primary osteoarthritis of hip: Secondary | ICD-10-CM | POA: Insufficient documentation

## 2012-11-03 DIAGNOSIS — M76899 Other specified enthesopathies of unspecified lower limb, excluding foot: Secondary | ICD-10-CM | POA: Diagnosis not present

## 2012-11-03 DIAGNOSIS — M6281 Muscle weakness (generalized): Secondary | ICD-10-CM | POA: Diagnosis not present

## 2012-11-03 DIAGNOSIS — M25659 Stiffness of unspecified hip, not elsewhere classified: Secondary | ICD-10-CM | POA: Diagnosis not present

## 2012-11-03 DIAGNOSIS — M25559 Pain in unspecified hip: Secondary | ICD-10-CM | POA: Diagnosis not present

## 2012-11-10 DIAGNOSIS — M25659 Stiffness of unspecified hip, not elsewhere classified: Secondary | ICD-10-CM | POA: Diagnosis not present

## 2012-11-10 DIAGNOSIS — M6281 Muscle weakness (generalized): Secondary | ICD-10-CM | POA: Diagnosis not present

## 2012-11-10 DIAGNOSIS — M76899 Other specified enthesopathies of unspecified lower limb, excluding foot: Secondary | ICD-10-CM | POA: Diagnosis not present

## 2012-11-10 DIAGNOSIS — M25559 Pain in unspecified hip: Secondary | ICD-10-CM | POA: Diagnosis not present

## 2012-11-17 DIAGNOSIS — M76899 Other specified enthesopathies of unspecified lower limb, excluding foot: Secondary | ICD-10-CM | POA: Diagnosis not present

## 2012-11-17 DIAGNOSIS — M25659 Stiffness of unspecified hip, not elsewhere classified: Secondary | ICD-10-CM | POA: Diagnosis not present

## 2012-11-17 DIAGNOSIS — M6281 Muscle weakness (generalized): Secondary | ICD-10-CM | POA: Diagnosis not present

## 2012-11-17 DIAGNOSIS — M25559 Pain in unspecified hip: Secondary | ICD-10-CM | POA: Diagnosis not present

## 2012-11-27 DIAGNOSIS — M25559 Pain in unspecified hip: Secondary | ICD-10-CM | POA: Diagnosis not present

## 2012-11-27 DIAGNOSIS — M6281 Muscle weakness (generalized): Secondary | ICD-10-CM | POA: Diagnosis not present

## 2012-11-27 DIAGNOSIS — M25659 Stiffness of unspecified hip, not elsewhere classified: Secondary | ICD-10-CM | POA: Diagnosis not present

## 2012-11-27 DIAGNOSIS — M76899 Other specified enthesopathies of unspecified lower limb, excluding foot: Secondary | ICD-10-CM | POA: Diagnosis not present

## 2013-01-29 DIAGNOSIS — H2510 Age-related nuclear cataract, unspecified eye: Secondary | ICD-10-CM | POA: Diagnosis not present

## 2013-05-04 ENCOUNTER — Ambulatory Visit (INDEPENDENT_AMBULATORY_CARE_PROVIDER_SITE_OTHER): Payer: Medicare Other | Admitting: Family Medicine

## 2013-05-04 ENCOUNTER — Encounter: Payer: Self-pay | Admitting: Family Medicine

## 2013-05-04 VITALS — BP 142/80 | HR 67 | Temp 97.9°F | Wt 230.0 lb

## 2013-05-04 DIAGNOSIS — B182 Chronic viral hepatitis C: Secondary | ICD-10-CM

## 2013-05-04 DIAGNOSIS — I10 Essential (primary) hypertension: Secondary | ICD-10-CM | POA: Diagnosis not present

## 2013-05-04 NOTE — Progress Notes (Signed)
Pre visit review using our clinic review tool, if applicable. No additional management support is needed unless otherwise documented below in the visit note.  Prev on lisinopril prev.  He volunteered for a program at [facility].  His BP was up initially, SBP ~180.   He was able to exercise with the program and then his wife who is a Therapist, sports checked his BP at home.  Since then his BP has been consistent between the L and R arm.  Usually 130-140s/80s.  Occ higher.   He cut out table salt but still has salt in the food.  He is getting more exercise recently.  No CP, SOB, BLE.    Meds, vitals, and allergies reviewed.   ROS: See HPI.  Otherwise, noncontributory.  GEN: nad, alert and oriented HEENT: mucous membranes moist NECK: supple w/o LA CV: rrr.  PULM: ctab, no inc wob ABD: soft, +bs EXT: no edema SKIN: no acute rash

## 2013-05-04 NOTE — Patient Instructions (Signed)
Try to cut out salt and keep exercising.  Check your BP once or twice a week.  I would check it after getting up and urinating, after sitting down a few minutes.  If persistently >140/>90, then notify Dr. Silvio Pate. Take care. Glad to see you.

## 2013-05-06 NOTE — Assessment & Plan Note (Signed)
Pt asked about potential HCV tx and I'll defer to PCP.

## 2013-05-06 NOTE — Assessment & Plan Note (Signed)
Usually with reasonable control at home.  I would cut back on salt, continue exercise, check BP episodically and if up then notify PCP.  He was anxious/in a rush the day of the elevated BP at [facility].  He agrees.

## 2013-05-27 DIAGNOSIS — W57XXXA Bitten or stung by nonvenomous insect and other nonvenomous arthropods, initial encounter: Secondary | ICD-10-CM | POA: Diagnosis not present

## 2013-05-27 DIAGNOSIS — S90569A Insect bite (nonvenomous), unspecified ankle, initial encounter: Secondary | ICD-10-CM | POA: Diagnosis not present

## 2013-05-28 ENCOUNTER — Encounter: Payer: Self-pay | Admitting: Internal Medicine

## 2013-12-10 ENCOUNTER — Telehealth: Payer: Self-pay | Admitting: Internal Medicine

## 2013-12-10 NOTE — Telephone Encounter (Signed)
Okay with me 

## 2013-12-10 NOTE — Telephone Encounter (Signed)
Pt called and would like to switch PCP's from Dr. Silvio Pate to Dr. Damita Dunnings. Is this ok w/both of you? Thank you.

## 2013-12-11 NOTE — Telephone Encounter (Signed)
Okay with me. Thanks 

## 2013-12-12 NOTE — Telephone Encounter (Signed)
Scheduled [facility] w/Dr. Damita Dunnings 01/04/2014

## 2013-12-19 ENCOUNTER — Other Ambulatory Visit: Payer: Self-pay | Admitting: Family Medicine

## 2013-12-19 DIAGNOSIS — B182 Chronic viral hepatitis C: Secondary | ICD-10-CM

## 2013-12-19 DIAGNOSIS — E785 Hyperlipidemia, unspecified: Secondary | ICD-10-CM

## 2013-12-22 ENCOUNTER — Encounter: Payer: Self-pay | Admitting: Family Medicine

## 2013-12-28 ENCOUNTER — Other Ambulatory Visit (INDEPENDENT_AMBULATORY_CARE_PROVIDER_SITE_OTHER): Payer: Medicare Other

## 2013-12-28 DIAGNOSIS — E785 Hyperlipidemia, unspecified: Secondary | ICD-10-CM | POA: Diagnosis not present

## 2013-12-28 DIAGNOSIS — B182 Chronic viral hepatitis C: Secondary | ICD-10-CM

## 2013-12-28 DIAGNOSIS — Z23 Encounter for immunization: Secondary | ICD-10-CM | POA: Diagnosis not present

## 2013-12-28 LAB — LIPID PANEL
CHOLESTEROL: 178 mg/dL (ref 0–200)
HDL: 48.7 mg/dL (ref >39.00)
LDL Cholesterol: 114 mg/dL — ABNORMAL HIGH (ref 0–99)
NonHDL: 129.3
Total CHOL/HDL Ratio: 4
Triglycerides: 75 mg/dL (ref 0.0–149.0)
VLDL: 15 mg/dL (ref 0.0–40.0)

## 2013-12-28 LAB — COMPREHENSIVE METABOLIC PANEL
ALT: 39 U/L (ref 0–53)
AST: 30 U/L (ref 0–37)
Albumin: 4 g/dL (ref 3.5–5.2)
Alkaline Phosphatase: 68 U/L (ref 39–117)
BILIRUBIN TOTAL: 0.8 mg/dL (ref 0.2–1.2)
BUN: 21 mg/dL (ref 6–23)
CO2: 29 meq/L (ref 19–32)
CREATININE: 1 mg/dL (ref 0.4–1.5)
Calcium: 9 mg/dL (ref 8.4–10.5)
Chloride: 104 mEq/L (ref 96–112)
GFR: 81.94 mL/min (ref >60.00)
GLUCOSE: 111 mg/dL — AB (ref 70–99)
Potassium: 4.6 mEq/L (ref 3.5–5.1)
Sodium: 137 mEq/L (ref 135–145)
Total Protein: 7.2 g/dL (ref 6.0–8.3)

## 2013-12-28 LAB — CBC WITH DIFFERENTIAL/PLATELET
BASOS ABS: 0.1 10*3/uL (ref 0.0–0.1)
Basophils Relative: 0.8 % (ref 0.0–3.0)
EOS PCT: 2 % (ref 0.0–5.0)
Eosinophils Absolute: 0.1 10*3/uL (ref 0.0–0.7)
HEMATOCRIT: 52.2 % — AB (ref 39.0–52.0)
Hemoglobin: 17.1 g/dL — ABNORMAL HIGH (ref 13.0–17.0)
LYMPHS ABS: 2.1 10*3/uL (ref 0.7–4.0)
Lymphocytes Relative: 29.1 % (ref 12.0–46.0)
MCHC: 32.8 g/dL (ref 30.0–36.0)
MCV: 95.7 fl (ref 78.0–100.0)
MONO ABS: 0.7 10*3/uL (ref 0.1–1.0)
Monocytes Relative: 10 % (ref 3.0–12.0)
Neutro Abs: 4.3 10*3/uL (ref 1.4–7.7)
Neutrophils Relative %: 58.1 % (ref 43.0–77.0)
PLATELETS: 176 10*3/uL (ref 150.0–400.0)
RBC: 5.45 Mil/uL (ref 4.22–5.81)
RDW: 13.6 % (ref 11.5–15.5)
WBC: 7.4 10*3/uL (ref 4.0–10.5)

## 2013-12-28 NOTE — Addendum Note (Signed)
Addended by: Josetta Huddle on: 12/28/2013 08:26 AM   Modules accepted: Orders

## 2014-01-04 ENCOUNTER — Ambulatory Visit (INDEPENDENT_AMBULATORY_CARE_PROVIDER_SITE_OTHER): Payer: Medicare Other | Admitting: Family Medicine

## 2014-01-04 ENCOUNTER — Encounter: Payer: Self-pay | Admitting: Family Medicine

## 2014-01-04 VITALS — BP 138/90 | HR 66 | Temp 97.7°F | Ht 75.0 in | Wt 227.5 lb

## 2014-01-04 DIAGNOSIS — I10 Essential (primary) hypertension: Secondary | ICD-10-CM

## 2014-01-04 DIAGNOSIS — R972 Elevated prostate specific antigen [PSA]: Secondary | ICD-10-CM

## 2014-01-04 DIAGNOSIS — Z7189 Other specified counseling: Secondary | ICD-10-CM

## 2014-01-04 DIAGNOSIS — Z0000 Encounter for general adult medical examination without abnormal findings: Secondary | ICD-10-CM | POA: Diagnosis not present

## 2014-01-04 DIAGNOSIS — B182 Chronic viral hepatitis C: Secondary | ICD-10-CM

## 2014-01-04 DIAGNOSIS — E785 Hyperlipidemia, unspecified: Secondary | ICD-10-CM

## 2014-01-04 NOTE — Progress Notes (Signed)
Pre visit review using our clinic review tool, if applicable. No additional management support is needed unless otherwise documented below in the visit note.  I have personally reviewed the Medicare Annual Wellness questionnaire and have noted 1. The patient's medical and social history 2. Their use of alcohol, tobacco or illicit drugs 3. Their current medications and supplements 4. The patient's functional ability including ADL's, fall risks, home safety risks and hearing or visual             impairment. 5. Diet and physical activities 6. Evidence for depression or mood disorders  The patients weight, height, BMI have been recorded in the chart and visual acuity is per eye clinic.  I have made referrals, counseling and provided education to the patient based review of the above and I have provided the pt with a written personalized care plan for preventive services.  Provider list updated- see scanned forms.  Routine anticipatory guidance given to patient.  See health maintenance.  Flu  2015 Shingles up to date PNA- can return for PNA-13 later on, d/w pt.  Tetanus up to date.  Colonoscopy 2012 Prostate cancer screening and PSA options (with potential risks and benefits of testing vs not testing) were discussed along with recent recs/guidelines.  He declined testing PSA at this point. Advance directive- wife then his daughter Margarita Rana would be designated if patient were incapacitated.  Cognitive function addressed- see scanned forms- and if abnormal then additional documentation follows.  Mild inc in sugar, d/w pt about labs.  He'll continue work on diet and exercise, he does well on both.    He has had some episodic BP elevation at home.  BP was okay today.  D/w pt.  He'll calibrate his cuff.   HCV.  D/w pt.  Longstanding.  Wanted to get set up with local HCV clinic and this is reasonable.    PMH and SH reviewed  Meds, vitals, and allergies reviewed.   ROS: See HPI.   Otherwise negative.    GEN: nad, alert and oriented HEENT: mucous membranes moist NECK: supple w/o LA CV: rrr. PULM: ctab, no inc wob ABD: soft, +bs EXT: no edema SKIN: no acute rash

## 2014-01-04 NOTE — Patient Instructions (Addendum)
Call back in about 1 month to get a prevnar 13 pneumonia shot or get it at the pharmacy.   Rosaria Ferries will call about your referral. Take care. Check your BP cuff against another cuff to make sure it isn't measuring high.  Glad to see you.

## 2014-01-06 DIAGNOSIS — Z7189 Other specified counseling: Secondary | ICD-10-CM | POA: Insufficient documentation

## 2014-01-06 NOTE — Assessment & Plan Note (Signed)
Refer for eval.

## 2014-01-06 NOTE — Assessment & Plan Note (Signed)
Flu  2015 Shingles up to date PNA- can return for PNA-13 later on, d/w pt.  Tetanus up to date.  Colonoscopy 2012 Prostate cancer screening and PSA options (with potential risks and benefits of testing vs not testing) were discussed along with recent recs/guidelines.  He declined testing PSA at this point. Advance directive- wife then his daughter Margarita Rana would be designated if patient were incapacitated.  Cognitive function addressed- see scanned forms- and if abnormal then additional documentation follows.  Mild inc in sugar, d/w pt about labs.  He'll continue work on diet and exercise, he does well on both.

## 2014-01-06 NOTE — Assessment & Plan Note (Signed)
No meds at this point, he'll calibrate his cuff and notify me if persistently elevated.  BP okay today at [place].

## 2014-01-06 NOTE — Assessment & Plan Note (Signed)
No tx other than diet and exercise, at least until HCV is resolved.   D/w pt.

## 2014-02-11 ENCOUNTER — Other Ambulatory Visit: Payer: Self-pay | Admitting: Internal Medicine

## 2014-02-11 DIAGNOSIS — B182 Chronic viral hepatitis C: Secondary | ICD-10-CM | POA: Diagnosis not present

## 2014-02-18 ENCOUNTER — Other Ambulatory Visit: Payer: Self-pay | Admitting: Internal Medicine

## 2014-02-18 DIAGNOSIS — B182 Chronic viral hepatitis C: Secondary | ICD-10-CM

## 2014-02-20 ENCOUNTER — Ambulatory Visit
Admission: RE | Admit: 2014-02-20 | Discharge: 2014-02-20 | Disposition: A | Discharge status: Home / Self Care | Payer: Medicare Other | Source: Ambulatory Visit | Attending: Internal Medicine | Admitting: Internal Medicine

## 2014-02-20 DIAGNOSIS — B182 Chronic viral hepatitis C: Secondary | ICD-10-CM | POA: Diagnosis not present

## 2014-04-17 DIAGNOSIS — B182 Chronic viral hepatitis C: Secondary | ICD-10-CM | POA: Diagnosis not present

## 2014-04-30 DIAGNOSIS — B182 Chronic viral hepatitis C: Secondary | ICD-10-CM | POA: Diagnosis not present

## 2014-06-12 DIAGNOSIS — B182 Chronic viral hepatitis C: Secondary | ICD-10-CM | POA: Diagnosis not present

## 2014-08-13 ENCOUNTER — Telehealth: Payer: Self-pay | Admitting: Family Medicine

## 2014-08-13 NOTE — Telephone Encounter (Signed)
Sent message asking pt if he wanted to discuss shot or get vaccine

## 2014-08-13 NOTE — Telephone Encounter (Signed)
Pt sent my chart message see below Does he need dr visit or nurse visit  Appointment Request From: Barnetta Chapel       With Provider: Owens Loffler, MD [facility] [place]      Preferred Date Range: From 08/13/2014 To 08/16/2014      Preferred Times: Any      Reason: To address the following health maintenance concerns.   Pna Vac Low Risk Adult      Comments:

## 2014-08-13 NOTE — Telephone Encounter (Signed)
RN visit if he wants to get the shot, OV if he wants to discuss.  Thanks.

## 2014-08-14 NOTE — Telephone Encounter (Signed)
Appointment 7/28 nurse visit  Sent my chart message

## 2014-08-15 ENCOUNTER — Ambulatory Visit (INDEPENDENT_AMBULATORY_CARE_PROVIDER_SITE_OTHER): Payer: Medicare Other

## 2014-08-15 DIAGNOSIS — Z23 Encounter for immunization: Secondary | ICD-10-CM

## 2014-09-04 DIAGNOSIS — B182 Chronic viral hepatitis C: Secondary | ICD-10-CM | POA: Diagnosis not present

## 2014-11-28 DIAGNOSIS — B182 Chronic viral hepatitis C: Secondary | ICD-10-CM | POA: Diagnosis not present

## 2014-12-04 DIAGNOSIS — B182 Chronic viral hepatitis C: Secondary | ICD-10-CM | POA: Diagnosis not present

## 2014-12-05 ENCOUNTER — Other Ambulatory Visit: Payer: Self-pay | Admitting: Nurse Practitioner

## 2014-12-06 ENCOUNTER — Other Ambulatory Visit (HOSPITAL_COMMUNITY): Payer: Self-pay | Admitting: Nurse Practitioner

## 2014-12-06 DIAGNOSIS — B182 Chronic viral hepatitis C: Secondary | ICD-10-CM

## 2014-12-11 ENCOUNTER — Ambulatory Visit (INDEPENDENT_AMBULATORY_CARE_PROVIDER_SITE_OTHER): Payer: Medicare Other

## 2014-12-11 DIAGNOSIS — Z23 Encounter for immunization: Secondary | ICD-10-CM | POA: Diagnosis not present

## 2015-01-01 ENCOUNTER — Ambulatory Visit (HOSPITAL_COMMUNITY): Payer: Medicare Other

## 2015-01-08 ENCOUNTER — Ambulatory Visit (HOSPITAL_COMMUNITY): Payer: Medicare Other

## 2015-01-14 ENCOUNTER — Ambulatory Visit (HOSPITAL_COMMUNITY)
Admission: RE | Admit: 2015-01-14 | Discharge: 2015-01-14 | Disposition: A | Discharge status: Home / Self Care | Payer: Medicare Other | Source: Ambulatory Visit | Attending: Nurse Practitioner | Admitting: Nurse Practitioner

## 2015-01-14 DIAGNOSIS — B182 Chronic viral hepatitis C: Secondary | ICD-10-CM | POA: Insufficient documentation

## 2015-01-14 DIAGNOSIS — B1920 Unspecified viral hepatitis C without hepatic coma: Secondary | ICD-10-CM | POA: Diagnosis not present

## 2015-01-16 ENCOUNTER — Encounter: Payer: Self-pay | Admitting: *Deleted

## 2015-02-07 ENCOUNTER — Other Ambulatory Visit: Payer: Self-pay | Admitting: Family Medicine

## 2015-02-07 DIAGNOSIS — E785 Hyperlipidemia, unspecified: Secondary | ICD-10-CM

## 2015-02-12 ENCOUNTER — Other Ambulatory Visit (INDEPENDENT_AMBULATORY_CARE_PROVIDER_SITE_OTHER): Payer: Medicare Other

## 2015-02-12 DIAGNOSIS — E785 Hyperlipidemia, unspecified: Secondary | ICD-10-CM

## 2015-02-12 LAB — COMPREHENSIVE METABOLIC PANEL
ALBUMIN: 4.2 g/dL (ref 3.5–5.2)
ALT: 14 U/L (ref 0–53)
AST: 16 U/L (ref 0–37)
Alkaline Phosphatase: 66 U/L (ref 39–117)
BUN: 19 mg/dL (ref 6–23)
CALCIUM: 9.3 mg/dL (ref 8.4–10.5)
CHLORIDE: 105 meq/L (ref 96–112)
CO2: 31 meq/L (ref 19–32)
Creatinine, Ser: 1.06 mg/dL (ref 0.40–1.50)
GFR: 71.99 mL/min (ref >60.00)
Glucose, Bld: 101 mg/dL — ABNORMAL HIGH (ref 70–99)
POTASSIUM: 4.8 meq/L (ref 3.5–5.1)
Sodium: 141 mEq/L (ref 135–145)
TOTAL PROTEIN: 7.1 g/dL (ref 6.0–8.3)
Total Bilirubin: 0.6 mg/dL (ref 0.2–1.2)

## 2015-02-12 LAB — LIPID PANEL
CHOL/HDL RATIO: 4
CHOLESTEROL: 154 mg/dL (ref 0–200)
HDL: 41.4 mg/dL (ref >39.00)
LDL CALC: 90 mg/dL (ref 0–99)
NonHDL: 112.31
TRIGLYCERIDES: 113 mg/dL (ref 0.0–149.0)
VLDL: 22.6 mg/dL (ref 0.0–40.0)

## 2015-02-17 ENCOUNTER — Ambulatory Visit (INDEPENDENT_AMBULATORY_CARE_PROVIDER_SITE_OTHER): Payer: Medicare Other | Admitting: Family Medicine

## 2015-02-17 ENCOUNTER — Encounter: Payer: Self-pay | Admitting: Family Medicine

## 2015-02-17 VITALS — BP 130/78 | HR 61 | Temp 97.7°F | Ht 75.0 in | Wt 235.5 lb

## 2015-02-17 DIAGNOSIS — Z0000 Encounter for general adult medical examination without abnormal findings: Secondary | ICD-10-CM | POA: Diagnosis not present

## 2015-02-17 DIAGNOSIS — B182 Chronic viral hepatitis C: Secondary | ICD-10-CM

## 2015-02-17 NOTE — Progress Notes (Signed)
Pre visit review using our clinic review tool, if applicable. No additional management support is needed unless otherwise documented below in the visit note.  I have personally reviewed the Medicare Annual Wellness questionnaire and have noted 1. The patient's medical and social history 2. Their use of alcohol, tobacco or illicit drugs 3. Their current medications and supplements 4. The patient's functional ability including ADL's, fall risks, home safety risks and hearing or visual             impairment. 5. Diet and physical activities 6. Evidence for depression or mood disorders  The patients weight, height, BMI have been recorded in the chart and visual acuity is per eye clinic.  I have made referrals, counseling and provided education to the patient based review of the above and I have provided the pt with a written personalized care plan for preventive services.  Provider list updated- see scanned forms.  Routine anticipatory guidance given to patient.  See health maintenance.  Flu 2016 Shingles 2009 PNA 2016 Tetanus 2014 Colonoscopy 2012 Prostate cancer screening and PSA options (with potential risks and benefits of testing vs not testing) were discussed along with recent recs/guidelines.  He declined testing PSA at this point. Advance directive- d/w pt.  If patient is incapacitated, would have wife then daughter Carmelina Dane.  Cognitive function addressed- see scanned forms- and if abnormal then additional documentation follows.   HCV treated and cured, with minimal fibrosis.  He is going well.  We're both happy about him getting treated.    ED noted, mild, didn't want treatment.  Can follow clinically.    occ hip pains.  Tight hamstrings at baseline.  Still in the AM, better with walking.  Not having daily need for ibuprofen, but used before golf.   PMH and SH reviewed  Meds, vitals, and allergies reviewed.   ROS: See HPI.  Otherwise negative.    GEN: nad, alert  and oriented HEENT: mucous membranes moist NECK: supple w/o LA CV: rrr. PULM: ctab, no inc wob ABD: soft, +bs EXT: no edema SKIN: no acute rash Limited ext rotation B hips with tight hamstrings noted.

## 2015-02-17 NOTE — Patient Instructions (Signed)
Take care.  Glad to see you.  Use the lower back and hip exercises and update me as needed.  Take care.  Glad to see you.

## 2015-02-19 NOTE — Assessment & Plan Note (Signed)
Flu 2016 Shingles 2009 PNA 2016 Tetanus 2014 Colonoscopy 2012 Prostate cancer screening and PSA options (with potential risks and benefits of testing vs not testing) were discussed along with recent recs/guidelines.  He declined testing PSA at this point. Advance directive- d/w pt.  If patient is incapacitated, would have wife then daughter Carmelina Dane.  Cognitive function addressed- see scanned forms- and if abnormal then additional documentation follows.  Handout given re: hip ROM/exercises and he'll f/u prn.  Labs d/w pt, along with diet and exercise.

## 2015-02-19 NOTE — Assessment & Plan Note (Signed)
Resolved

## 2015-10-19 ENCOUNTER — Encounter: Payer: Self-pay | Admitting: Family Medicine

## 2015-10-21 ENCOUNTER — Other Ambulatory Visit: Payer: Self-pay | Admitting: Family Medicine

## 2015-10-21 MED ORDER — SILDENAFIL CITRATE 20 MG PO TABS: 60.0000 mg | ORAL_TABLET | Freq: Every day | ORAL | 99 refills | Status: DC | PRN

## 2015-11-19 DIAGNOSIS — B182 Chronic viral hepatitis C: Secondary | ICD-10-CM | POA: Diagnosis not present

## 2015-12-08 DIAGNOSIS — B182 Chronic viral hepatitis C: Secondary | ICD-10-CM | POA: Diagnosis not present

## 2015-12-10 ENCOUNTER — Ambulatory Visit (INDEPENDENT_AMBULATORY_CARE_PROVIDER_SITE_OTHER): Payer: Medicare Other

## 2015-12-10 DIAGNOSIS — Z23 Encounter for immunization: Secondary | ICD-10-CM | POA: Diagnosis not present

## 2016-01-14 ENCOUNTER — Ambulatory Visit: Payer: Medicare Other | Admitting: Family Medicine

## 2016-01-15 ENCOUNTER — Ambulatory Visit (INDEPENDENT_AMBULATORY_CARE_PROVIDER_SITE_OTHER): Payer: Medicare Other | Admitting: Family Medicine

## 2016-01-15 ENCOUNTER — Encounter: Payer: Self-pay | Admitting: Family Medicine

## 2016-01-15 VITALS — BP 148/82 | HR 59 | Temp 97.7°F | Wt 239.5 lb

## 2016-01-15 DIAGNOSIS — R21 Rash and other nonspecific skin eruption: Secondary | ICD-10-CM

## 2016-01-15 MED ORDER — CEPHALEXIN 500 MG PO CAPS: 500.0000 mg | ORAL_CAPSULE | Freq: Three times a day (TID) | ORAL | 0 refills | Status: DC

## 2016-01-15 MED ORDER — PREDNISONE 20 MG PO TABS: ORAL_TABLET | ORAL | 0 refills | Status: DC

## 2016-01-15 NOTE — Patient Instructions (Signed)
I have sent two prescriptions to your pharmacy- an antibiotic (cephalexin) and a strong anti-inflammatory to help with itching (prednisone) Please let us know if you are not better with the medicines

## 2016-01-15 NOTE — Progress Notes (Signed)
Pre visit review using our clinic review tool, if applicable. No additional management support is needed unless otherwise documented below in the visit note. 

## 2016-01-15 NOTE — Progress Notes (Signed)
Subjective:    Patient ID: Victor Ball, male    DOB: May 16, 1940, 75 y.o.   MRN: [id number]  HPI This is a 75 yo male who presents today with a rash on both arms. Started on left arm a couple of months ago and over last 10 days also appeared on right arm. Scratches at night and has blood on sheets. A few lesions on left leg. Has tried hydrocortisone cream without relief. Was using [place] soap for several months and switched to [place]. Showers 2x/ week. Wife switched to special detergent after lesions appeared.   Has tried sildenafil 3-4 times since October without much improvement of ED.   Past Medical History:  Diagnosis Date  . Hepatitis C, chronic ([place])    treated prev with Harvoni  . Hyperlipidemia   . Hypertension   . Kidney stone ~2009   never retrieved stone   Past Surgical History:  Procedure Laterality Date  . APPENDECTOMY  1948  . PROSTATE BIOPSY     neg, x2  . TOE FUSION  1985   left great toe  . TONSILLECTOMY AND ADENOIDECTOMY  1945  . VASECTOMY     Family History  Problem Relation Age of Onset  . Heart disease Father   . Diabetes Father   . Celiac disease Sister   . Stroke Brother   . Heart disease Brother     MI then stents  . Cancer Paternal Aunt   . Celiac disease Brother   . Prostate cancer Neg Hx   . Colon cancer Neg Hx    Social History  Substance Use Topics  . Smoking status: Former Smoker    Quit date: 01/18/1973  . Smokeless tobacco: Never Used  . Alcohol use Yes     Comment: occ      Review of Systems Per HPI    Objective:   Physical Exam  Constitutional: He is oriented to person, place, and time. He appears well-developed and well-nourished. No distress.  HENT:  Head: Normocephalic.  Eyes: Conjunctivae are normal.  Cardiovascular: Normal rate.   Pulmonary/Chest: Effort normal.  Neurological: He is alert and oriented to person, place, and time.  Skin: Skin is warm and dry. Rash noted. He is not diaphoretic.  Bilateral forearms  with multiple, scattered, raised lesions. Lesions range from small, erythematous to larger, open, angry, slightly moist and some scabbing noted as well. Approximately 5 lesions on left lateral calf.   Vitals reviewed.    BP (!) 148/82   Pulse (!) 59   Temp 97.7 F (36.5 C) (Oral)   Wt 239 lb 8 oz (108.6 kg)   SpO2 96%   BMI 29.94 kg/m  Wt Readings from Last 3 Encounters:  01/15/16 239 lb 8 oz (108.6 kg)  02/17/15 235 lb 8 oz (106.8 kg)  01/04/14 227 lb 8 oz (103.2 kg)       Assessment & Plan:  Discussed with Dr. Damita Dunnings who also examined patient.  1. Rash and nonspecific skin eruption - possible folliculitis, will treat for infection and inflammation - Provided written and verbal information regarding diagnosis and treatment. - RTC if no improvement with treatment - cephALEXin (KEFLEX) 500 MG capsule; Take 1 capsule (500 mg total) by mouth 3 (three) times daily.  Dispense: 21 capsule; Refill: 0 - predniSONE (DELTASONE) 20 MG tablet; Take 3 tablets for 3 days then 2 for 3 days then 1 for 3 days  Dispense: 18 tablet; Refill: 0   Clarene Reamer, FNP-BC  [facility]  01/15/2016 3:38 PM

## 2016-01-29 ENCOUNTER — Encounter: Payer: Self-pay | Admitting: Family Medicine

## 2016-01-29 ENCOUNTER — Other Ambulatory Visit: Payer: Self-pay | Admitting: Family Medicine

## 2016-01-29 DIAGNOSIS — R21 Rash and other nonspecific skin eruption: Secondary | ICD-10-CM

## 2016-02-03 DIAGNOSIS — L308 Other specified dermatitis: Secondary | ICD-10-CM | POA: Diagnosis not present

## 2016-02-06 ENCOUNTER — Other Ambulatory Visit: Payer: Self-pay | Admitting: Family Medicine

## 2016-02-06 DIAGNOSIS — E785 Hyperlipidemia, unspecified: Secondary | ICD-10-CM

## 2016-02-13 ENCOUNTER — Other Ambulatory Visit (INDEPENDENT_AMBULATORY_CARE_PROVIDER_SITE_OTHER): Payer: Medicare Other

## 2016-02-13 DIAGNOSIS — E785 Hyperlipidemia, unspecified: Secondary | ICD-10-CM

## 2016-02-13 LAB — LIPID PANEL
Cholesterol: 146 mg/dL (ref 0–200)
HDL: 39.4 mg/dL
LDL Cholesterol: 82 mg/dL (ref 0–99)
NonHDL: 106.86
Total CHOL/HDL Ratio: 4
Triglycerides: 122 mg/dL (ref 0.0–149.0)
VLDL: 24.4 mg/dL (ref 0.0–40.0)

## 2016-02-13 LAB — COMPREHENSIVE METABOLIC PANEL WITH GFR
ALT: 17 U/L (ref 0–53)
AST: 17 U/L (ref 0–37)
Albumin: 4.3 g/dL (ref 3.5–5.2)
Alkaline Phosphatase: 75 U/L (ref 39–117)
BUN: 17 mg/dL (ref 6–23)
CO2: 33 meq/L — ABNORMAL HIGH (ref 19–32)
Calcium: 9.2 mg/dL (ref 8.4–10.5)
Chloride: 106 meq/L (ref 96–112)
Creatinine, Ser: 0.97 mg/dL (ref 0.40–1.50)
GFR: 79.55 mL/min
Glucose, Bld: 108 mg/dL — ABNORMAL HIGH (ref 70–99)
Potassium: 4.6 meq/L (ref 3.5–5.1)
Sodium: 142 meq/L (ref 135–145)
Total Bilirubin: 0.5 mg/dL (ref 0.2–1.2)
Total Protein: 7.2 g/dL (ref 6.0–8.3)

## 2016-02-17 ENCOUNTER — Ambulatory Visit (INDEPENDENT_AMBULATORY_CARE_PROVIDER_SITE_OTHER): Payer: Medicare Other

## 2016-02-17 VITALS — BP 136/80 | HR 69 | Temp 98.6°F | Ht 73.25 in | Wt 232.2 lb

## 2016-02-17 DIAGNOSIS — Z0000 Encounter for general adult medical examination without abnormal findings: Secondary | ICD-10-CM

## 2016-02-17 NOTE — Progress Notes (Signed)
Subjective:   Victor Ball is a 76 y.o. male who presents for Medicare Annual/Subsequent preventive examination.  Review of Systems:  N/A Cardiac Risk Factors include: advanced age (>77men, >58 women);male gender;dyslipidemia;hypertension     Objective:    Vitals: BP 136/80 (BP Location: Right Arm, Patient Position: Sitting, Cuff Size: Normal)   Pulse 69   Temp 98.6 F (37 C) (Oral)   Ht 6' 1.25" (1.861 m) Comment: no shoes  Wt 232 lb 4 oz (105.3 kg)   SpO2 94%   BMI 30.43 kg/m   Body mass index is 30.43 kg/m.  Tobacco History  Smoking Status  . Former Smoker  . Quit date: 01/18/1973  Smokeless Tobacco  . Never Used     Counseling given: No   Past Medical History:  Diagnosis Date  . Hepatitis C, chronic ([place])    treated prev with Harvoni  . Hyperlipidemia   . Hypertension   . Kidney stone ~2009   never retrieved stone   Past Surgical History:  Procedure Laterality Date  . APPENDECTOMY  1948  . PROSTATE BIOPSY     neg, x2  . TOE FUSION  1985   left great toe  . TONSILLECTOMY AND ADENOIDECTOMY  1945  . VASECTOMY     Family History  Problem Relation Age of Onset  . Heart disease Father   . Diabetes Father   . Celiac disease Sister   . Stroke Brother   . Heart disease Brother     MI then stents  . Celiac disease Brother   . Cancer Paternal Aunt   . Prostate cancer Neg Hx   . Colon cancer Neg Hx    History  Sexual Activity  . Sexual activity: Yes    Outpatient Encounter Prescriptions as of 02/17/2016  Medication Sig  . aspirin 81 MG tablet Take 81 mg by mouth daily.  . fish oil-omega-3 fatty acids 1000 MG capsule Take 2 g by mouth daily.  . Multiple Vitamins-Minerals (MATURE ADULT CENTURY PO) Take by mouth daily.  . NON FORMULARY Tart Cherry Extract 1200 mg  . OVER THE COUNTER MEDICATION TUMERIC CIRCUMIN  Daily  . sildenafil (REVATIO) 20 MG tablet Take 3-5 tablets (60-100 mg total) by mouth daily as needed.  . triamcinolone cream (KENALOG) 0.1 %  Apply 1 application topically 2 (two) times daily.  . [DISCONTINUED] cephALEXin (KEFLEX) 500 MG capsule Take 1 capsule (500 mg total) by mouth 3 (three) times daily.  . [DISCONTINUED] predniSONE (DELTASONE) 20 MG tablet Take 3 tablets for 3 days then 2 for 3 days then 1 for 3 days   No facility-administered encounter medications on file as of 02/17/2016.     Activities of Daily Living In your present state of health, do you have any difficulty performing the following activities: 02/17/2016  Hearing? N  Vision? N  Difficulty concentrating or making decisions? N  Walking or climbing stairs? N  Dressing or bathing? N  Doing errands, shopping? N  Preparing Food and eating ? N  Using the Toilet? N  In the past six months, have you accidently leaked urine? N  Do you have problems with loss of bowel control? N  Managing your Medications? N  Managing your Finances? N  Housekeeping or managing your Housekeeping? N  Some recent data might be hidden    Patient Care Team: Tonia Ghent, MD as PCP - General (Family Medicine)   Assessment:     Hearing Screening   125Hz  250Hz  500Hz  1000Hz   2000Hz  3000Hz  4000Hz  6000Hz  8000Hz   Right ear:   40 40 40  40    Left ear:   40 40 40  0    Vision Screening Comments: Last vision exam in Jan 2017    Exercise Activities and Dietary recommendations Current Exercise Habits: Home exercise routine, Type of exercise: Other - see comments (tennis 2 days per week, golf 2 days per week, hiking 2-3 days per month), Time (Minutes): > 60, Frequency (Times/Week): 4, Weekly Exercise (Minutes/Week): 0, Intensity: Moderate, Exercise limited by: None identified  Goals    . Increase physical activity          Starting 02/17/2016, I will continue to exercise at least 90 min 4 days a week and hike 2-3 days per month.       Fall Risk Fall Risk  02/17/2016 02/17/2015 01/04/2014 10/24/2012 04/21/2012  Falls in the past year? No No No No No   Depression Screen PHQ 2/9  Scores 02/17/2016 02/17/2015 01/04/2014 10/24/2012  PHQ - 2 Score 0 0 0 0    Cognitive Function MMSE - Mini Mental State Exam 02/17/2016  Orientation to time 5  Orientation to Place 5  Registration 3  Attention/ Calculation 0  Recall 3  Language- name 2 objects 0  Language- repeat 1  Language- follow 3 step command 3  Language- read & follow direction 0  Write a sentence 0  Copy design 0  Total score 20     PLEASE NOTE: A Mini-Cog screen was completed. Maximum score is 20. A value of 0 denotes this part of [place] MMSE was not completed or the patient failed this part of the Mini-Cog screening.   Mini-Cog Screening Orientation to Time - Max 5 pts Orientation to Place - Max 5 pts Registration - Max 3 pts Recall - Max 3 pts Language Repeat - Max 1 pts Language Follow 3 Step Command - Max 3 pts     Immunization History  Administered Date(s) Administered  . Influenza Split 10/06/2011  . Influenza,inj,Quad PF,36+ Mos 10/04/2012, 12/28/2013, 12/11/2014, 12/10/2015  . Pneumococcal Conjugate-13 08/15/2014  . Pneumococcal Polysaccharide-23 01/19/2003, 06/16/2012  . Td 10/24/2012  . Tdap 01/19/2003  . Zoster 01/19/2007   Screening Tests Health Maintenance  Topic Date Due  . TETANUS/TDAP  10/25/2022  . INFLUENZA VACCINE  Completed  . ZOSTAVAX  Completed  . PNA vac Low Risk Adult  Completed      Plan:     I have personally reviewed and addressed the Medicare Annual Wellness questionnaire and have noted the following in the patient's chart:  A. Medical and social history B. Use of alcohol, tobacco or illicit drugs  C. Current medications and supplements D. Functional ability and status E.  Nutritional status F.  Physical activity G. Advance directives H. List of other physicians I.  Hospitalizations, surgeries, and ER visits in previous 12 months J.  [place] to include hearing, vision, cognitive, depression L. Referrals and appointments - none  In  addition, I have reviewed and discussed with patient certain preventive protocols, quality metrics, and best practice recommendations. A written personalized care plan for preventive services as well as general preventive health recommendations were provided to patient.  See attached scanned questionnaire for additional information.   Signed,   Lindell Noe, MHA, BS, LPN Health Coach

## 2016-02-17 NOTE — Progress Notes (Signed)
PCP notes:   Health maintenance:  No gaps identified. Health maintenance schedule verbally reviewed with patient.   Abnormal screenings:   Hearing - failed  Patient concerns:   Pt wants to discuss new Shingles vaccine. Pt also wants to discuss BP and current medications.   Nurse concerns:  None  Next PCP appt:   02/19/16 @ 1045  I reviewed health advisor's note, was available for consultation on the day of service listed in this note, and agree with documentation and plan. Elsie Stain, MD.

## 2016-02-17 NOTE — Progress Notes (Signed)
Pre visit review using our clinic review tool, if applicable. No additional management support is needed unless otherwise documented below in the visit note. 

## 2016-02-17 NOTE — Patient Instructions (Signed)
Victor Ball , Thank you for taking time to come for your Medicare Wellness Visit. I appreciate your ongoing commitment to your health goals. Please review the following plan we discussed and let me know if I can assist you in the future.   These are the goals we discussed: Goals    . Increase physical activity          Starting 02/17/2016, I will continue to exercise at least 90 min 4 days a week and hike 2-3 days per month.        This is a list of the screening recommended for you and due dates:  Health Maintenance  Topic Date Due  . Tetanus Vaccine  10/25/2022  . Flu Shot  Completed  . Shingles Vaccine  Completed  . Pneumonia vaccines  Completed   Preventive Care for Adults  A healthy lifestyle and preventive care can promote health and wellness. Preventive health guidelines for adults include the following key practices.  . A routine yearly physical is a good way to check with your health care provider about your health and preventive screening. It is a chance to share any concerns and updates on your health and to receive a thorough exam.  . Visit your dentist for a routine exam and preventive care every 6 months. Brush your teeth twice a day and floss once a day. Good oral hygiene prevents tooth decay and gum disease.  . The frequency of eye exams is based on your age, health, family medical history, use  of contact lenses, and other factors. Follow your health care provider's ecommendations for frequency of eye exams.  . Eat a healthy diet. Foods like vegetables, fruits, whole grains, low-fat dairy products, and lean protein foods contain the nutrients you need without too many calories. Decrease your intake of foods high in solid fats, added sugars, and salt. Eat the right amount of calories for you. Get information about a proper diet from your health care provider, if necessary.  . Regular physical exercise is one of the most important things you can do for your health. Most  adults should get at least 150 minutes of moderate-intensity exercise (any activity that increases your heart rate and causes you to sweat) each week. In addition, most adults need muscle-strengthening exercises on 2 or more days a week.  Silver Sneakers may be a benefit available to you. To determine eligibility, you may visit the website: www.silversneakers.com or contact program at (917)267-8998 Mon-Fri between 8AM-8PM.   . Maintain a healthy weight. The body mass index (BMI) is a screening tool to identify possible weight problems. It provides an estimate of body fat based on height and weight. Your health care provider can find your BMI and can help you achieve or maintain a healthy weight.   For adults 20 years and older: ? A BMI below 18.5 is considered underweight. ? A BMI of 18.5 to 24.9 is normal. ? A BMI of 25 to 29.9 is considered overweight. ? A BMI of 30 and above is considered obese.   . Maintain normal blood lipids and cholesterol levels by exercising and minimizing your intake of saturated fat. Eat a balanced diet with plenty of fruit and vegetables. Blood tests for lipids and cholesterol should begin at age 75 and be repeated every 5 years. If your lipid or cholesterol levels are high, you are over 50, or you are at high risk for heart disease, you may need your cholesterol levels checked more frequently. Ongoing  high lipid and cholesterol levels should be treated with medicines if diet and exercise are not working.  . If you smoke, find out from your health care provider how to quit. If you do not use tobacco, please do not start.  . If you choose to drink alcohol, please do not consume more than 2 drinks per day. One drink is considered to be 12 ounces (355 mL) of beer, 5 ounces (148 mL) of wine, or 1.5 ounces (44 mL) of liquor.  . If you are 55-72 years old, ask your health care provider if you should take aspirin to prevent strokes.  . Use sunscreen. Apply sunscreen  liberally and repeatedly throughout the day. You should seek shade when your shadow is shorter than you. Protect yourself by wearing long sleeves, pants, a wide-brimmed hat, and sunglasses year round, whenever you are outdoors.  . Once a month, do a whole body skin exam, using a mirror to look at the skin on your back. Tell your health care provider of new moles, moles that have irregular borders, moles that are larger than a pencil eraser, or moles that have changed in shape or color.

## 2016-02-19 ENCOUNTER — Encounter: Payer: Self-pay | Admitting: Family Medicine

## 2016-02-19 ENCOUNTER — Ambulatory Visit (INDEPENDENT_AMBULATORY_CARE_PROVIDER_SITE_OTHER): Payer: Medicare Other | Admitting: Family Medicine

## 2016-02-19 DIAGNOSIS — Z0000 Encounter for general adult medical examination without abnormal findings: Secondary | ICD-10-CM | POA: Insufficient documentation

## 2016-02-19 DIAGNOSIS — Z7189 Other specified counseling: Secondary | ICD-10-CM

## 2016-02-19 DIAGNOSIS — M25552 Pain in left hip: Secondary | ICD-10-CM

## 2016-02-19 DIAGNOSIS — N529 Male erectile dysfunction, unspecified: Secondary | ICD-10-CM

## 2016-02-19 DIAGNOSIS — R030 Elevated blood-pressure reading, without diagnosis of hypertension: Secondary | ICD-10-CM | POA: Diagnosis not present

## 2016-02-19 DIAGNOSIS — R21 Rash and other nonspecific skin eruption: Secondary | ICD-10-CM | POA: Diagnosis not present

## 2016-02-19 DIAGNOSIS — R739 Hyperglycemia, unspecified: Secondary | ICD-10-CM | POA: Diagnosis not present

## 2016-02-19 MED ORDER — TADALAFIL 20 MG PO TABS: 10.0000 mg | ORAL_TABLET | ORAL | 11 refills | Status: DC | PRN

## 2016-02-19 NOTE — Patient Instructions (Signed)
Use the hip exercises.  Update me as needed.  Keep working on diet and get back in the gym.  Your BP was okay here today.  Take care.  Glad to see you.

## 2016-02-19 NOTE — Progress Notes (Signed)
Abnormal screenings:  Hearing - failed- d/w pt.  Declined hearing aids.    Patient concerns:  D/w pt shingles vaccine- would defer for now, d/w pt.  He has zostavax prev.    Prostate cancer screening and PSA options (with potential risks and benefits of testing vs not testing) were discussed along with recent recs/guidelines.  He declined testing PSA at this point. He has had 2 neg bx in the past.    Living will d/w pt.  Wife, then daughters equally if wife were incapacitated, if patient were incapacitated.    BP better here on our cuffs, both today and with prev AMW visit.   146/88 on our cuff, 166/87 on his cuff.  Diet and exercise d/w pt.  He is still playing golf, hiking, and doing well with exercise.   His arms are clearly better with TAC 0.1% cream with less rash.  Not itching.    He needs to restart stretching for L hip, d/w pt about handout.    ED.  D/w pt.  Asking about options.  Minimal help with 100mg  a day of viagra.  D/w pt about trial of another med.    Hyperglycemia d/w pt re: labs and diet and exercise.    PMH and SH reviewed  ROS: Per HPI unless specifically indicated in ROS section   Meds, vitals, and allergies reviewed.   GEN: nad, alert and oriented HEENT: mucous membranes moist NECK: supple w/o LA CV: rrr.  no murmur PULM: ctab, no inc wob ABD: soft, +bs EXT: no edema SKIN: no acute rash but improved chronic changes noted on the bilateral arms.

## 2016-02-19 NOTE — Assessment & Plan Note (Signed)
D/w pt shingles vaccine- would defer for now, d/w pt.  He has zostavax prev.    Prostate cancer screening and PSA options (with potential risks and benefits of testing vs not testing) were discussed along with recent recs/guidelines.  He declined testing PSA at this point. He has had 2 neg bx in the past.    Living will d/w pt.  Wife, then daughters equally if wife were incapacitated, if patient were incapacitated.

## 2016-02-19 NOTE — Progress Notes (Signed)
Pre visit review using our clinic review tool, if applicable. No additional management support is needed unless otherwise documented below in the visit note. 

## 2016-02-19 NOTE — Assessment & Plan Note (Signed)
Handout given. Continue home exercises. Update me as needed.

## 2016-02-19 NOTE — Assessment & Plan Note (Signed)
Improved. He has follow with dermatology pending. Appreciate dermatology input.

## 2016-02-19 NOTE — Assessment & Plan Note (Signed)
Blood pressure improved on check here. I question if his cuff is reading artificially high. I do not want overtreat or overcorrect. Discussed with patient. Blood pressure is acceptable at this point given his age.

## 2016-02-19 NOTE — Assessment & Plan Note (Signed)
Reasonable to try Cialis with routine cautions. Update me as needed.

## 2016-02-19 NOTE — Assessment & Plan Note (Signed)
Living will d/w pt.  Wife designated, then daughters equally if wife were incapacitated, if patient were incapacitated.   ?

## 2016-02-19 NOTE — Assessment & Plan Note (Signed)
Discussed with patient about diet labs and exercise. >25 minutes spent in face to face time with patient, >50% spent in counselling or coordination of care.

## 2016-03-01 DIAGNOSIS — L308 Other specified dermatitis: Secondary | ICD-10-CM | POA: Diagnosis not present

## 2016-06-09 ENCOUNTER — Encounter: Payer: Self-pay | Admitting: Family Medicine

## 2016-06-10 ENCOUNTER — Telehealth: Payer: Self-pay | Admitting: Family Medicine

## 2016-06-10 NOTE — Telephone Encounter (Signed)
Thanks

## 2016-06-10 NOTE — Telephone Encounter (Signed)
Please see about getting patient scheduled with Dr. Lorelei Pont re: hip pain.  Thanks.

## 2016-06-10 NOTE — Telephone Encounter (Signed)
Dr.Copland's first available appointment was 07/05/16.  I spoke to patient and he scheduled appointment with Dr.Rigby at [facility] on 06/16/16.

## 2016-06-16 ENCOUNTER — Ambulatory Visit (INDEPENDENT_AMBULATORY_CARE_PROVIDER_SITE_OTHER): Payer: Medicare Other | Admitting: Sports Medicine

## 2016-06-16 ENCOUNTER — Encounter: Payer: Self-pay | Admitting: Sports Medicine

## 2016-06-16 ENCOUNTER — Ambulatory Visit: Payer: Medicare Other

## 2016-06-16 VITALS — BP 150/92 | HR 60 | Ht 73.25 in | Wt 235.8 lb

## 2016-06-16 DIAGNOSIS — M25552 Pain in left hip: Secondary | ICD-10-CM

## 2016-06-16 NOTE — Progress Notes (Addendum)
OFFICE VISIT NOTE Victor Ball, Victor Ball at [place] - 76 y.o. male MRN [id number]  Date of birth: [birth year]  Visit Date: 06/16/2016  PCP: Tonia Ghent, MD   Referred by: Tonia Ghent, MD  [name], CMA acting as scribe for Dr. Paulla Fore.  SUBJECTIVE:   Chief Complaint  Patient presents with  . pain in left hip   HPI: As below and per problem based documentation when appropriate.  Pt presents today with complaint of left hip pain Pain started last fall. The location of the pain would very, radiating up and down his leg from the hip to the knee.  Pt plays tennis and golf.   The pain is described as aching, sometimes stabbing pain and is rated as 2/10 at minimum and 8/10 at its worst. Pt has noticed that he does limp when first waking up in the morning.   Pt doesn't noticed any particular action that makes the pain worse, it is always just there at a varying degree.  Improves with 400 mg Ibuprofen Therapies tried include steroid injection and PT 4 years ago. 400 mg Ibuprofen (from having a tooth pulled yesterday). Pt was given therapeutic exercises by his PCP but reports no improvement.   Other associated symptoms include: none  Last xray was 10/04/2012  Pt denies fever, chills, night sweat, no unintentional weight loss or weight gain.     Review of Systems  Constitutional: Negative for chills and fever.  Respiratory: Negative for shortness of breath and wheezing.   Cardiovascular: Negative for chest pain, palpitations and leg swelling.  Musculoskeletal: Positive for joint pain.  Neurological: Negative for dizziness, tingling and headaches.  Endo/Heme/Allergies: Does not bruise/bleed easily.    Otherwise per HPI.  HISTORY & PERTINENT PRIOR DATA:  No specialty comments available. He reports that he quit smoking about 43 years ago. He has never used smokeless tobacco. No results for  input(s): HGBA1C, LABURIC in the last 8760 hours. Medications & Allergies reviewed per EMR Patient Active Problem List   Diagnosis Date Noted  . Healthcare maintenance 02/19/2016  . ED (erectile dysfunction) 02/19/2016  . Rash 02/19/2016  . Hyperglycemia 02/19/2016  . Advance care planning 01/06/2014  . Osteoarthritis, hip, bilateral 10/31/2012  . Medicare annual wellness visit, subsequent 10/24/2012  . Left hip pain 10/04/2012  . Elevated PSA 10/06/2011  . Elevated BP without diagnosis of hypertension   . Hyperlipidemia   . Kidney stone   . Hepatitis C, chronic ([facility])    Past Medical History:  Diagnosis Date  . Hepatitis C, chronic ([place])    treated prev with Harvoni  . Hyperlipidemia   . Hypertension   . Kidney stone ~2009   never retrieved stone   Family History  Problem Relation Age of Onset  . Heart disease Father   . Diabetes Father   . Celiac disease Sister   . Stroke Brother   . Heart disease Brother        MI then stents  . Celiac disease Brother   . Cancer Paternal Aunt   . Prostate cancer Neg Hx   . Colon cancer Neg Hx    Past Surgical History:  Procedure Laterality Date  . APPENDECTOMY  1948  . PROSTATE BIOPSY     neg, x2  . TOE FUSION  1985   left great toe  . TONSILLECTOMY AND ADENOIDECTOMY  1945  . VASECTOMY  Social History   Occupational History  . 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 Smoking status: Former Smoker    Quit date: 01/18/1973  . Smokeless tobacco: Never Used  . Alcohol use Yes     Comment: occ  . Drug use: No  . Sexual activity: Yes    OBJECTIVE:  VS:  HT:6' 1.25" (186.1 cm)   WT:235 lb 12.8 oz (107 kg)  BMI:31    BP:(!) 150/92  HR:60bpm  TEMP: ( )  RESP:94 % EXAM: Findings:  WDWN, NAD, Non-toxic appearing Alert & appropriately interactive Not depressed or anxious appearing No increased work of breathing. Pupils are equal. EOM intact without  nystagmus No clubbing or cyanosis of the extremities appreciated No significant rashes/lesions/ulcerations overlying the examined area. DP & PT pulses 2+/4.  No significant pretibial edema. Sensation intact to light touch in lower extremities.  Left hip: Overall well aligned.  No significant deformity. Good internal and external rotation.  This could be within the greater sciatic notch but is painful over the glute medius was mildly painful over the greater trochanter. Hip abduction strengthening as T FL predominant and rated at 3 out of 5.      No results found. ASSESSMENT & PLAN:   Problem List Items Addressed This Visit    Left hip pain - Primary    Symptoms are consistent with glute medius tendinopathy and greater trochanteric bursitis. Injection today and long discussion regarding importance of home exercise program as outlined in the AVS.  ++++++++++++++++++++++++++++++++++++++++++++ PROCEDURE NOTE: Left hip greater trochanteric injection  DESCRIPTION OF PROCEDURE:  The patient's clinical condition is marked by substantial pain and/or significant functional disability. Other conservative therapy has not provided relief, is contraindicated, or not appropriate. There is a reasonable likelihood that injection will significantly improve the patient's pain and/or functional impairment. After discussing the risks, benefits and expected outcomes of the injection and all questions were reviewed and answered, the patient wished to undergo the above named procedure. Verbal consent was obtained. The target structure was injected under direct visualization using sterile technique as below: PREP: Alcohol, Ethel Chloride APPROACH: Lateral, 21g 2" needle INJECTATE: 2cc 1% lidocaine, 2cc 40mg  DepoMedrol DRESSING: Band-Aid  Post procedural instructions including recommending icing and warning signs for infection were reviewed. This procedure was well tolerated and there were no complications.             Follow-up: Return in about 6 weeks (around 07/28/2016).   CMA/ATC served as Education administrator during this visit. History, Physical, and Plan performed by medical provider. Documentation and orders reviewed and attested to.      Teresa Coombs, [facility] Physician

## 2016-06-16 NOTE — Patient Instructions (Addendum)
Please perform the exercise program that Victor Ball has prepared for you and gone over in detail on a daily basis.  In addition to the handout you were provided you can access your program through: [email or web address]   Your unique program code is: [phone]

## 2016-06-21 NOTE — Assessment & Plan Note (Signed)
Symptoms are consistent with glute medius tendinopathy and greater trochanteric bursitis. Injection today and long discussion regarding importance of home exercise program as outlined in the AVS.  ++++++++++++++++++++++++++++++++++++++++++++ PROCEDURE NOTE: Left hip greater trochanteric injection  DESCRIPTION OF PROCEDURE:  The patient's clinical condition is marked by substantial pain and/or significant functional disability. Other conservative therapy has not provided relief, is contraindicated, or not appropriate. There is a reasonable likelihood that injection will significantly improve the patient's pain and/or functional impairment. After discussing the risks, benefits and expected outcomes of the injection and all questions were reviewed and answered, the patient wished to undergo the above named procedure. Verbal consent was obtained. The target structure was injected under direct visualization using sterile technique as below: PREP: Alcohol, Ethel Chloride APPROACH: Lateral, 21g 2" needle INJECTATE: 2cc 1% lidocaine, 2cc 40mg  DepoMedrol DRESSING: Band-Aid  Post procedural instructions including recommending icing and warning signs for infection were reviewed. This procedure was well tolerated and there were no complications.

## 2016-06-22 ENCOUNTER — Encounter: Payer: Self-pay | Admitting: Sports Medicine

## 2016-06-23 ENCOUNTER — Other Ambulatory Visit: Payer: Self-pay

## 2016-06-23 MED ORDER — NITROGLYCERIN 0.2 MG/HR TD PT24: MEDICATED_PATCH | TRANSDERMAL | 1 refills | Status: DC

## 2016-06-23 NOTE — Progress Notes (Unsigned)
Patient called back in to advise he'd like to move forward with the patches.

## 2016-07-06 ENCOUNTER — Encounter: Payer: Self-pay | Admitting: Sports Medicine

## 2016-07-08 ENCOUNTER — Encounter: Payer: Self-pay | Admitting: Sports Medicine

## 2016-07-08 ENCOUNTER — Ambulatory Visit (INDEPENDENT_AMBULATORY_CARE_PROVIDER_SITE_OTHER): Payer: Medicare Other | Admitting: Sports Medicine

## 2016-07-08 ENCOUNTER — Ambulatory Visit (INDEPENDENT_AMBULATORY_CARE_PROVIDER_SITE_OTHER): Payer: Medicare Other

## 2016-07-08 ENCOUNTER — Ambulatory Visit: Payer: Self-pay

## 2016-07-08 VITALS — BP 160/90 | HR 67 | Ht 73.25 in | Wt 233.3 lb

## 2016-07-08 DIAGNOSIS — M25552 Pain in left hip: Secondary | ICD-10-CM

## 2016-07-08 DIAGNOSIS — M1612 Unilateral primary osteoarthritis, left hip: Secondary | ICD-10-CM | POA: Diagnosis not present

## 2016-07-08 DIAGNOSIS — M160 Bilateral primary osteoarthritis of hip: Secondary | ICD-10-CM | POA: Diagnosis not present

## 2016-07-08 DIAGNOSIS — M545 Low back pain: Secondary | ICD-10-CM | POA: Diagnosis not present

## 2016-07-08 DIAGNOSIS — M47816 Spondylosis without myelopathy or radiculopathy, lumbar region: Secondary | ICD-10-CM

## 2016-07-08 NOTE — Progress Notes (Signed)
OFFICE VISIT NOTE Victor Ball, [place] [facility] [place] - 76 y.o. male MRN [id number]  Date of birth: [birth year]  Visit Date: 07/08/2016  PCP: Victor Ghent, MD   Referred by: Victor Ghent, MD  Victor Ball, CMA acting as scribe for Dr. Paulla Ball.  SUBJECTIVE:   Chief Complaint  Patient presents with  . Follow-up    left hip pain   HPI: As below and per problem based documentation when appropriate.  Pt presents today in follow-up of left hip pain.   He has faithfully done the prescribed exercises since May 30th. Although his flexibility has improved with the exercises,the pain in the hip has only gotten worse and more often. It feels like a knife hitting his hip. He used the 1/2 nitroglycerin patch for 6 days and found welts on his thigh and buttocks so he stopped. He hasn't used it in 10 days.  He feels that this issue is no longer in the "sports injury mode" and may need to look farther into the problem. He would like to discuss possible imaging and/or referral to ortho.   He reports that he was walking on uneven ground this morning and felt a terrible pain because his left leg was on the follower part of the slope. The pain was so bad, it felt like an inflamed nerve, it almost caused him to fall to the ground. He is also having a lot of pain when trying to lift the left leg into the car.   Pt denies fever, chills, night sweats.    Review of Systems  Constitutional: Negative for chills and fever.  Respiratory: Negative for shortness of breath and wheezing.   Cardiovascular: Negative for chest pain, palpitations and leg swelling.  Musculoskeletal: Positive for joint pain. Negative for falls.  Neurological: Negative for dizziness, tingling and headaches.  Endo/Heme/Allergies: Does not bruise/bleed easily.    Otherwise per HPI.  HISTORY & PERTINENT PRIOR DATA:  No specialty comments available. He  reports that he quit smoking about 43 years ago. He has never used smokeless tobacco. No results for input(s): HGBA1C, LABURIC in the last 8760 hours. Medications & Allergies reviewed per EMR Patient Active Problem List   Diagnosis Date Noted  . Lumbar spondylosis 07/19/2016  . Healthcare maintenance 02/19/2016  . ED (erectile dysfunction) 02/19/2016  . Rash 02/19/2016  . Hyperglycemia 02/19/2016  . Advance care planning 01/06/2014  . Osteoarthritis, hip, bilateral 10/31/2012  . Medicare annual wellness visit, subsequent 10/24/2012  . Left hip pain 10/04/2012  . Elevated PSA 10/06/2011  . Elevated BP without diagnosis of hypertension   . Hyperlipidemia   . Kidney stone   . Hepatitis C, chronic (HCC)    Past Medical History:  Diagnosis Date  . Hepatitis C, chronic ([place])    treated prev with Harvoni  . Hyperlipidemia   . Hypertension   . Kidney stone ~2009   never retrieved stone   Family History  Problem Relation Age of Onset  . Heart disease Father   . Diabetes Father   . Celiac disease Sister   . Stroke Brother   . Heart disease Brother        MI then stents  . Celiac disease Brother   . Cancer Paternal Aunt   . Prostate cancer Neg Hx   . Colon cancer Neg Hx    Past Surgical History:  Procedure Laterality Date  . APPENDECTOMY  1948  .  PROSTATE BIOPSY     neg, x2  . TOE FUSION  1985   left great toe  . TONSILLECTOMY AND ADENOIDECTOMY  1945  . VASECTOMY     Social History   Occupational History  . 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 Smoking status: Former Smoker    Quit date: 01/18/1973  . Smokeless tobacco: Never Used  . Alcohol use Yes     Comment: occ  . Drug use: No  . Sexual activity: Yes    OBJECTIVE:  VS:  HT:6' 1.25" (186.1 cm)   WT:233 lb 4.8 oz (105.8 kg)  BMI:30.6    BP:(!) 160/90  HR:67bpm  TEMP: ( )  RESP:95 % EXAM: Findings:  Adult male.  In no acute distress.   Alert and appropriate.  Bilateral lower extremities overall well aligned.  Bilateral trace pitting edema.  Sensation is intact light touch.  He has no pain with straight leg raise.  He has only minimal pain with terminal FADIR.  Slight amount of pain with logroll.  Pain with Stinchfield testing.  Hip abduction strength has improved and is 4+/5 with improved glute medius recruitment.     Dg Lumbar Spine 2-3 Views  Result Date: 07/08/2016 CLINICAL DATA:  Pt states he was walking on uneven ground this AM when he began having severe pain in Lt hip and Lt lower back. No fall. EXAM: LUMBAR SPINE - 2-3 VIEW COMPARISON:  None. FINDINGS: There are moderate degenerative changes in the lumbar spine, primarily at L4-5 and L5-S1. Anterior osteophytes identified at numerous levels. No spondylolisthesis. No acute fracture. No suspicious lytic or blastic lesions are identified. IMPRESSION: No evidence for acute  abnormality.  Degenerative changes. Electronically Signed   By: Victor Ball M.D.   On: 07/08/2016 12:39   Dg Hip Unilat W Or W/o Pelvis 2-3 Views Left  Result Date: 07/08/2016 CLINICAL DATA:  Pain EXAM: DG HIP (WITH OR WITHOUT PELVIS) 2-3V LEFT COMPARISON:  None. FINDINGS: Frontal pelvis as well as lateral left hip images were obtained. There is extensive symmetric osteoarthritic change in both hip joints. There is marked narrowing of each hip joint with bony eburnation along both sides of the joint, slightly more severe on the left than on the right. No erosive change. No fracture or dislocation. There is degenerative change in the visualize lower lumbar spine. IMPRESSION: Extensive osteoarthritic change bilaterally with joint space narrowing symmetric. Slightly more extensive bony eburnation on the left than on the right. No fracture or dislocation. Electronically Signed   By: Victor Ball III M.D.   On: 07/08/2016 12:36   ASSESSMENT & PLAN:   Problem List Items Addressed This Visit    Left hip  pain    Multifactorial with some glute tendinopathy based on exam.  He should continue with the therapeutic exercises previously reviewed       Relevant Orders   DG Lumbar Spine 2-3 Views (Completed)   DG HIP UNILAT W OR W/O PELVIS 2-3 VIEWS LEFT (Completed)   [place] LIMITED JOINT SPACE STRUCTURES LOW LEFT(NO LINKED CHARGES)   Osteoarthritis, hip, bilateral - Primary    Left worse than right hip arthritis with overall well-preserved range of motion.  Left hip injection performed today.  4 week recheck for further evaluation.  Discussion around possibility for total hip arthroplasty was begun.  PROCEDURE NOTE -  ULTRASOUND GUIDEDINJECTION: Left Hip Injection Images were obtained and interpreted by myself, Legrand Como  Victor Fore, DO  Images have been saved and stored to PACS system. Images obtained on: GE S7 Ultrasound machine  ULTRASOUND FINDINGS:  Degenerative spurring with no significant effusion of the left hip  DESCRIPTION OF PROCEDURE:  The patient's clinical condition is marked by substantial pain and/or significant functional disability. Other conservative therapy has not provided relief, is contraindicated, or not appropriate. There is a reasonable likelihood that injection will significantly improve the patient's pain and/or functional impairment. After discussing the risks, benefits and expected outcomes of the injection and all questions were reviewed and answered, the patient wished to undergo the above named procedure. Verbal consent was obtained. The ultrasound was used to identify the target structure and adjacent neurovascular structures. The skin was then prepped in sterile fashion and the target structure was injected under direct visualization using sterile technique as below: PREP: Alcohol, Ethel Chloride APPROACH: anterior, sterile stopcock Technique, 22g 3.5" needle INJECTATE: 3 cc 1% lidocaine, 2 cc 0.5% marcaine, 2cc 40mg  DepoMedrol ASPIRATE: N/A DRESSING: Band-Aid   Post  procedural instructions including recommending icing and warning signs for infection were reviewed. This procedure was well tolerated and there were no complications.   IMPRESSION: Succesful US Guided Injection          Relevant Orders   DG Lumbar Spine 2-3 Views (Completed)   DG HIP UNILAT W OR W/O PELVIS 2-3 VIEWS LEFT (Completed)   [place] LIMITED JOINT SPACE STRUCTURES LOW LEFT(NO LINKED CHARGES)   Lumbar spondylosis    This seems to be minimal and likely not related to symptoms that he is having this would need to be considered going forward if addressing his hip specifically do not improve.         Follow-up: Return in about 4 weeks (around 08/05/2016), or if symptoms worsen or fail to improve.    CMA/ATC served as Education administrator during this visit. History, Physical, and Plan performed by medical provider. Documentation and orders reviewed and attested to.      Teresa Coombs, [facility] Physician

## 2016-07-08 NOTE — Patient Instructions (Signed)

## 2016-07-19 DIAGNOSIS — M47816 Spondylosis without myelopathy or radiculopathy, lumbar region: Secondary | ICD-10-CM | POA: Insufficient documentation

## 2016-07-19 NOTE — Assessment & Plan Note (Signed)
This seems to be minimal and likely not related to symptoms that he is having this would need to be considered going forward if addressing his hip specifically do not improve.

## 2016-07-19 NOTE — Assessment & Plan Note (Signed)
Multifactorial with some glute tendinopathy based on exam.  He should continue with the therapeutic exercises previously reviewed

## 2016-07-19 NOTE — Assessment & Plan Note (Signed)
Left worse than right hip arthritis with overall well-preserved range of motion.  Left hip injection performed today.  4 week recheck for further evaluation.  Discussion around possibility for total hip arthroplasty was begun.  PROCEDURE NOTE -  ULTRASOUND GUIDEDINJECTION: Left Hip Injection Images were obtained and interpreted by myself, Teresa Coombs, DO  Images have been saved and stored to PACS system. Images obtained on: GE S7 Ultrasound machine  ULTRASOUND FINDINGS:  Degenerative spurring with no significant effusion of the left hip  DESCRIPTION OF PROCEDURE:  The patient's clinical condition is marked by substantial pain and/or significant functional disability. Other conservative therapy has not provided relief, is contraindicated, or not appropriate. There is a reasonable likelihood that injection will significantly improve the patient's pain and/or functional impairment. After discussing the risks, benefits and expected outcomes of the injection and all questions were reviewed and answered, the patient wished to undergo the above named procedure. Verbal consent was obtained. The ultrasound was used to identify the target structure and adjacent neurovascular structures. The skin was then prepped in sterile fashion and the target structure was injected under direct visualization using sterile technique as below: PREP: Alcohol, Ethel Chloride APPROACH: anterior, sterile stopcock Technique, 22g 3.5" needle INJECTATE: 3 cc 1% lidocaine, 2 cc 0.5% marcaine, 2cc 40mg  DepoMedrol ASPIRATE: N/A DRESSING: Band-Aid   Post procedural instructions including recommending icing and warning signs for infection were reviewed. This procedure was well tolerated and there were no complications.   IMPRESSION: Succesful US Guided Injection

## 2016-07-26 ENCOUNTER — Ambulatory Visit: Payer: Medicare Other | Admitting: Sports Medicine

## 2016-07-29 DIAGNOSIS — M160 Bilateral primary osteoarthritis of hip: Secondary | ICD-10-CM | POA: Diagnosis not present

## 2016-08-18 DIAGNOSIS — D1800 Hemangioma unspecified site: Secondary | ICD-10-CM | POA: Diagnosis not present

## 2016-08-18 DIAGNOSIS — L570 Actinic keratosis: Secondary | ICD-10-CM | POA: Diagnosis not present

## 2016-08-18 DIAGNOSIS — L812 Freckles: Secondary | ICD-10-CM | POA: Diagnosis not present

## 2016-08-18 DIAGNOSIS — L821 Other seborrheic keratosis: Secondary | ICD-10-CM | POA: Diagnosis not present

## 2016-08-18 DIAGNOSIS — L578 Other skin changes due to chronic exposure to nonionizing radiation: Secondary | ICD-10-CM | POA: Diagnosis not present

## 2016-10-04 ENCOUNTER — Encounter: Payer: Self-pay | Admitting: Family Medicine

## 2016-10-04 ENCOUNTER — Ambulatory Visit (INDEPENDENT_AMBULATORY_CARE_PROVIDER_SITE_OTHER): Payer: Medicare Other | Admitting: Family Medicine

## 2016-10-04 VITALS — BP 152/90 | HR 71 | Temp 97.7°F | Wt 234.5 lb

## 2016-10-04 DIAGNOSIS — M25552 Pain in left hip: Secondary | ICD-10-CM

## 2016-10-04 DIAGNOSIS — Z029 Encounter for administrative examinations, unspecified: Secondary | ICD-10-CM

## 2016-10-04 NOTE — Progress Notes (Signed)
His wife had gastric cancer, needed feeding tube placement.  This has been a significant amount of upheaval, d/w pt.  His wife is doing better.    He is scheduled for L hip surgery with out of network MD.  He had hip injection and PT w/o relief of pain.  He can't be active due to pain in the L hip.  D/w pt about options.  He has failed tx as above.  No CP, SOB, BLE edema.  No CAD, no CHF, no clear contraindication to surgery.  No h/o DM2, CVA, etc.    BP has been 140-150s/80s usually, with sign hip pain noted.    PMH and SH reviewed  ROS: Per HPI unless specifically indicated in ROS section   Meds, vitals, and allergies reviewed  GEN: nad, alert and oriented HEENT: mucous membranes moist NECK: supple w/o LA CV: rrr. PULM: ctab, no inc wob ABD: soft, +bs EXT: no edema SKIN: no acute rash Limping from left hip pain. Able to bear weight.

## 2016-10-04 NOTE — Patient Instructions (Signed)
Appropriately low risk for surgery.  Update me as needed.  Take care.  Glad to see you.  I would get a flu shot each fall.

## 2016-10-05 NOTE — Assessment & Plan Note (Signed)
No CAD, no CHF, no clear contraindication to surgery.  No h/o DM2, CVA, etc.  he has a known right bundle branch block without significant change on EKG today compared to prior. Discussed with patient. No contraindication to surgery at this point. Discussed with patient about risk and benefit of surgery. His hip pain is clearly causing a lot of pain and limiting his daily activities. While I cannot "clear" anyone for surgery, it is likely that he is appropriately low risk.  >25 minutes spent in face to face time with patient, >50% spent in counselling or coordination of care.   It turns out the patient is not going to be able to get his surgery done with the out of network doc and needs local referral. Done.

## 2016-10-11 ENCOUNTER — Telehealth: Payer: Self-pay | Admitting: Family Medicine

## 2016-10-11 ENCOUNTER — Ambulatory Visit (INDEPENDENT_AMBULATORY_CARE_PROVIDER_SITE_OTHER): Payer: Medicare Other | Admitting: *Deleted

## 2016-10-11 DIAGNOSIS — Z23 Encounter for immunization: Secondary | ICD-10-CM
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
## 2016-10-18 ENCOUNTER — Ambulatory Visit (INDEPENDENT_AMBULATORY_CARE_PROVIDER_SITE_OTHER): Payer: Medicare Other | Admitting: Orthopaedic Surgery

## 2016-10-18 DIAGNOSIS — M1612 Unilateral primary osteoarthritis, left hip: Secondary | ICD-10-CM | POA: Insufficient documentation

## 2016-10-18 DIAGNOSIS — M25552 Pain in left hip: Secondary | ICD-10-CM | POA: Insufficient documentation

## 2016-10-18 NOTE — Progress Notes (Signed)
Office Visit Note   Patient: Victor Ball           Date of Birth: 03-23-1940           MRN: [id number] Visit Date: 10/18/2016              Requested by: Tonia Ghent, MD [address] [place], [place] [address] PCP: Tonia Ghent, MD   Assessment & Plan: Visit Diagnoses:  1. Pain of left hip joint   2. Unilateral primary osteoarthritis, left hip     Plan: We had a long and thorough discussion about hip replacement surgery including showing him his x-rays and models of hip replacements. I gave him a handout on direct anterior hip surgery we had a discussion about the risk and benefits of the surgery as well as what is intraoperative and postoperative course with involved. All questions and concerns were answered and addressed. Given the fact that this is been going on for long period time and he said failed all forms conservative treatment I agree that surgery is warranted at this point. We would then see him back in 2 weeks postoperative.  Follow-Up Instructions: Return for 2 weeks post-op.   Orders:  No orders of the defined types were placed in this encounter.  No orders of the defined types were placed in this encounter.     Procedures: No procedures performed   Clinical Data: No additional findings.   Subjective: No chief complaint on file. The patient is a very pleasant 76 year old gentleman who comes in today to talk about hip replacement surgery for his left hip. He has known and well-documented osteoarthritis and degenerative joint disease of his left hip. He is actually scheduled to have this hip replacement surgery [place] part [place] in 2 weeks but that physician and Hospital out of network so is coming to me to consider surgery on his left hip. He has x-rays that show severe end-stage arthritis of the left hip. His pain is daily and he can be 10 out of 10. He is try conservative treatment for over a year now. This includes activity modification as  well as exercise program, anti-inflammatory medications and injections. Although he see me for the first time to schedule the surgery he again this had already treatment for over a year for this disease of hip arthritis his wife is with him today. He is otherwise a very active individual with no significant medical problems.  HPI  Review of Systems He denies any chest pain, short of breath, fever, chills, nausea, vomiting  Objective: Vital Signs: There were no vitals taken for this visit.  Physical Exam He is alert or 3 and in no acute distress Ortho Exam Examination of both hip shows stiffness with interaction rotation but much more on the left than the right. He has significant amount of pain in the groin and left the right and stiffness as well. Both knees have slight varus malalignment. His leg lengths appear equal. Specialty Comments:  No specialty comments available.  Imaging: No results found. X-rays of his pelvis and left hip independently reviewed by me show severe end-stage arthritis of really both hips. The left hip is much worse than the right. The left side has flattening of the femoral head and joint space narrowing as well as significant sclerotic changes and para-articular osteophytes.  PMFS History: Patient Active Problem List   Diagnosis Date Noted  . Pain of left hip joint 10/18/2016  .  Unilateral primary osteoarthritis, left hip 10/18/2016  . Lumbar spondylosis 07/19/2016  . Healthcare maintenance 02/19/2016  . ED (erectile dysfunction) 02/19/2016  . Rash 02/19/2016  . Hyperglycemia 02/19/2016  . Advance care planning 01/06/2014  . Osteoarthritis, hip, bilateral 10/31/2012  . Medicare annual wellness visit, subsequent 10/24/2012  . Left hip pain 10/04/2012  . Elevated PSA 10/06/2011  . Elevated BP without diagnosis of hypertension   . Hyperlipidemia   . Kidney stone   . Hepatitis C, chronic (HCC)    Past Medical History:  Diagnosis Date  . Hepatitis C,  chronic ([place])    treated prev with Harvoni  . Hyperlipidemia   . Hypertension   . Kidney stone ~2009   never retrieved stone    Family History  Problem Relation Age of Onset  . Heart disease Father   . Diabetes Father   . Celiac disease Sister   . Stroke Brother   . Heart disease Brother        MI then stents  . Celiac disease Brother   . Heart disease Brother   . Cancer Paternal Aunt   . Prostate cancer Neg Hx   . Colon cancer Neg Hx     Past Surgical History:  Procedure Laterality Date  . APPENDECTOMY  1948  . PROSTATE BIOPSY     neg, x2  . TOE FUSION  1985   left great toe  . TONSILLECTOMY AND ADENOIDECTOMY  1945  . VASECTOMY     Social History   Occupational History  . 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 Smoking status: Former Smoker    Quit date: 01/18/1973  . Smokeless tobacco: Never Used  . Alcohol use Yes     Comment: occ  . Drug use: No  . Sexual activity: Yes

## 2016-10-21 ENCOUNTER — Other Ambulatory Visit (INDEPENDENT_AMBULATORY_CARE_PROVIDER_SITE_OTHER): Payer: Self-pay | Admitting: Physician Assistant

## 2016-10-27 ENCOUNTER — Other Ambulatory Visit (INDEPENDENT_AMBULATORY_CARE_PROVIDER_SITE_OTHER): Payer: Self-pay

## 2016-10-28 NOTE — Pre-Procedure Instructions (Signed)
Victor Ball  10/28/2016      Victor Ball, [place] [address] [place] [address] Phone: [phone] Fax: [phone]    Your procedure is scheduled on October 23  Report to [facility] at 1100 A.M.  Call this number if you have problems the morning of surgery:  [phone]   Remember:  Do not eat food or drink liquids after midnight.  Continue all other medications as directed by your physician except follow these medication instructions before surgery   Take these medicines the morning of surgery with A SIP OF WATER NONE  7 days prior to surgery STOP taking any Aspirin (unless otherwise instructed by your surgeon), Aleve, Naproxen, Ibuprofen, Motrin, Advil, Goody's, BC's, all herbal medications, fish oil, and all vitamins   Do not wear jewelry.  Do not wear lotions, powders, or cologne, or deoderant.  Men may shave face and neck.  Do not bring valuables to the hospital.  [facility] is not responsible for any belongings or valuables.  Contacts, dentures or bridgework may not be worn into surgery.  Leave your suitcase in the car.  After surgery it may be brought to your room.  For patients admitted to the hospital, discharge time will be determined by your treatment team.  Patients discharged the day of surgery will not be allowed to drive home.    Special instructions:   Victor Ball- Preparing For Surgery  Before surgery, you can play an important role. Because skin is not sterile, your skin needs to be as free of germs as possible. You can reduce the number of germs on your skin by washing with CHG (chlorahexidine gluconate) Soap before surgery.  CHG is an antiseptic cleaner which kills germs and bonds with the skin to continue killing germs even after washing.  Please do not use if you have an allergy to CHG or antibacterial soaps. If your skin becomes reddened/irritated stop  using the CHG.  Do not shave (including legs and underarms) for at least 48 hours prior to first CHG shower. It is OK to shave your face.  Please follow these instructions carefully.   1. Shower the NIGHT BEFORE SURGERY and the MORNING OF SURGERY with CHG.   2. If you chose to wash your hair, wash your hair first as usual with your normal shampoo.  3. After you shampoo, rinse your hair and body thoroughly to remove the shampoo.  4. Use CHG as you would any other liquid soap. You can apply CHG directly to the skin and wash gently with a scrungie or a clean washcloth.   5. Apply the CHG Soap to your body ONLY FROM THE NECK DOWN.  Do not use on open wounds or open sores. Avoid contact with your eyes, ears, mouth and genitals (private parts). Wash Face and genitals (private parts)  with your normal soap.  6. Wash thoroughly, paying special attention to the area where your surgery will be performed.  7. Thoroughly rinse your body with warm water from the neck down.  8. DO NOT shower/wash with your normal soap after using and rinsing off the CHG Soap.  9. Pat yourself dry with a CLEAN TOWEL.  10. Wear CLEAN PAJAMAS to bed the night before surgery, wear comfortable clothes the morning of surgery  11. Place CLEAN SHEETS on your bed the night of your first shower and DO NOT SLEEP WITH PETS.  Day of Surgery: Do not apply any deodorants/lotions. Please wear clean clothes to the hospital/surgery center.      Please read over the following fact sheets that you were given.

## 2016-10-29 ENCOUNTER — Encounter (HOSPITAL_COMMUNITY): Payer: Self-pay

## 2016-10-29 ENCOUNTER — Encounter (HOSPITAL_COMMUNITY)
Admission: RE | Admit: 2016-10-29 | Discharge: 2016-10-29 | Disposition: A | Discharge status: Home / Self Care | Payer: Medicare Other | Source: Ambulatory Visit | Attending: Orthopaedic Surgery | Admitting: Orthopaedic Surgery

## 2016-10-29 DIAGNOSIS — M1612 Unilateral primary osteoarthritis, left hip: Secondary | ICD-10-CM | POA: Diagnosis not present

## 2016-10-29 DIAGNOSIS — Z01818 Encounter for other preprocedural examination: Secondary | ICD-10-CM | POA: Diagnosis not present

## 2016-10-29 HISTORY — DX: Personal history of urinary calculi: Z87.442

## 2016-10-29 LAB — BASIC METABOLIC PANEL
ANION GAP: 7 (ref 5–15)
BUN: 12 mg/dL (ref 6–20)
CALCIUM: 8.9 mg/dL (ref 8.9–10.3)
CO2: 27 mmol/L (ref 22–32)
CREATININE: 0.83 mg/dL (ref 0.61–1.24)
Chloride: 103 mmol/L (ref 101–111)
GLUCOSE: 96 mg/dL (ref 65–99)
Potassium: 4.1 mmol/L (ref 3.5–5.1)
Sodium: 137 mmol/L (ref 135–145)

## 2016-10-29 LAB — CBC
HCT: 47.6 % (ref 39.0–52.0)
HEMOGLOBIN: 16.2 g/dL (ref 13.0–17.0)
MCH: 31.3 pg (ref 26.0–34.0)
MCHC: 34 g/dL (ref 30.0–36.0)
MCV: 92.1 fL (ref 78.0–100.0)
PLATELETS: 186 10*3/uL (ref 150–400)
RBC: 5.17 MIL/uL (ref 4.22–5.81)
RDW: 13.2 % (ref 11.5–15.5)
WBC: 7.6 10*3/uL (ref 4.0–10.5)

## 2016-10-29 LAB — SURGICAL PCR SCREEN
MRSA, PCR: NEGATIVE
STAPHYLOCOCCUS AUREUS: NEGATIVE

## 2016-10-29 NOTE — Progress Notes (Signed)
WUJ:WJXBJY, Elveria Rising, MD  Cardiologist: pt denies  EKG: 10/04/16 in EPIC  Stress test: 5-10+ years ago  ECHO: pt denies ever  Cardiac Cath: pt denies ever  Chest x-ray: pt denies past year, no recent respiratory infection/complications

## 2016-11-08 MED ORDER — CEFAZOLIN SODIUM-DEXTROSE 2-4 GM/100ML-% IV SOLN
2.0000 g | INTRAVENOUS | Status: AC
  Administered 2016-11-09: 2 g via INTRAVENOUS
  Filled 2016-11-08: qty 100

## 2016-11-08 MED ORDER — TRANEXAMIC ACID 1000 MG/10ML IV SOLN
1000.0000 mg | INTRAVENOUS | Status: AC
  Administered 2016-11-09: 1000 mg via INTRAVENOUS
  Filled 2016-11-08: qty 10

## 2016-11-09 ENCOUNTER — Inpatient Hospital Stay (HOSPITAL_COMMUNITY): Payer: Medicare Other | Admitting: Certified Registered"

## 2016-11-09 ENCOUNTER — Inpatient Hospital Stay (HOSPITAL_COMMUNITY): Payer: Medicare Other

## 2016-11-09 ENCOUNTER — Encounter (HOSPITAL_COMMUNITY)
Admission: RE | Disposition: A | Discharge status: Home Health | Payer: Self-pay | Source: Ambulatory Visit | Attending: Orthopaedic Surgery

## 2016-11-09 ENCOUNTER — Inpatient Hospital Stay (HOSPITAL_COMMUNITY)
Admission: RE | Admit: 2016-11-09 | Discharge: 2016-11-11 | DRG: 470 | Disposition: A | Discharge status: Home Health | Payer: Medicare Other | Source: Ambulatory Visit | Attending: Orthopaedic Surgery | Admitting: Orthopaedic Surgery

## 2016-11-09 ENCOUNTER — Encounter (HOSPITAL_COMMUNITY): Payer: Self-pay

## 2016-11-09 ENCOUNTER — Inpatient Hospital Stay (HOSPITAL_COMMUNITY): Payer: Medicare Other | Admitting: Emergency Medicine

## 2016-11-09 DIAGNOSIS — N200 Calculus of kidney: Secondary | ICD-10-CM | POA: Diagnosis not present

## 2016-11-09 DIAGNOSIS — Z8619 Personal history of other infectious and parasitic diseases: Secondary | ICD-10-CM | POA: Diagnosis not present

## 2016-11-09 DIAGNOSIS — Z87891 Personal history of nicotine dependence: Secondary | ICD-10-CM

## 2016-11-09 DIAGNOSIS — Z96642 Presence of left artificial hip joint: Secondary | ICD-10-CM

## 2016-11-09 DIAGNOSIS — Z419 Encounter for procedure for purposes other than remedying health state, unspecified: Secondary | ICD-10-CM

## 2016-11-09 DIAGNOSIS — M1612 Unilateral primary osteoarthritis, left hip: Secondary | ICD-10-CM | POA: Diagnosis not present

## 2016-11-09 DIAGNOSIS — M160 Bilateral primary osteoarthritis of hip: Secondary | ICD-10-CM | POA: Diagnosis not present

## 2016-11-09 DIAGNOSIS — E785 Hyperlipidemia, unspecified: Secondary | ICD-10-CM | POA: Diagnosis present

## 2016-11-09 DIAGNOSIS — Z96643 Presence of artificial hip joint, bilateral: Secondary | ICD-10-CM | POA: Diagnosis not present

## 2016-11-09 DIAGNOSIS — Z9181 History of falling: Secondary | ICD-10-CM

## 2016-11-09 HISTORY — PX: TOTAL HIP ARTHROPLASTY: SHX124

## 2016-11-09 SURGERY — ARTHROPLASTY, HIP, TOTAL, ANTERIOR APPROACH
Anesthesia: Spinal | Site: Hip | Laterality: Left

## 2016-11-09 MED ORDER — HYDROMORPHONE HCL 1 MG/ML IJ SOLN
INTRAMUSCULAR | Status: AC
  Filled 2016-11-09: qty 1

## 2016-11-09 MED ORDER — LIDOCAINE 2% (20 MG/ML) 5 ML SYRINGE
INTRAMUSCULAR | Status: DC | PRN
  Administered 2016-11-09: 40 mg via INTRAVENOUS

## 2016-11-09 MED ORDER — OXYCODONE HCL 5 MG PO TABS
10.0000 mg | ORAL_TABLET | ORAL | Status: DC | PRN
  Administered 2016-11-09 – 2016-11-10 (×4): 10 mg via ORAL
  Filled 2016-11-09 (×3): qty 2

## 2016-11-09 MED ORDER — DOCUSATE SODIUM 100 MG PO CAPS
100.0000 mg | ORAL_CAPSULE | Freq: Two times a day (BID) | ORAL | Status: DC
  Administered 2016-11-09 – 2016-11-11 (×4): 100 mg via ORAL
  Filled 2016-11-09 (×4): qty 1

## 2016-11-09 MED ORDER — MIDAZOLAM HCL 2 MG/2ML IJ SOLN
INTRAMUSCULAR | Status: AC
  Filled 2016-11-09: qty 2

## 2016-11-09 MED ORDER — ACETAMINOPHEN 650 MG RE SUPP: 650.0000 mg | RECTAL | Status: DC | PRN

## 2016-11-09 MED ORDER — PROPOFOL 500 MG/50ML IV EMUL
INTRAVENOUS | Status: DC | PRN
  Administered 2016-11-09: 75 ug/kg/min via INTRAVENOUS

## 2016-11-09 MED ORDER — POLYETHYLENE GLYCOL 3350 17 G PO PACK: 17.0000 g | PACK | Freq: Every day | ORAL | Status: DC | PRN

## 2016-11-09 MED ORDER — ONDANSETRON HCL 4 MG/2ML IJ SOLN
INTRAMUSCULAR | Status: AC
  Filled 2016-11-09: qty 4

## 2016-11-09 MED ORDER — PROPOFOL 1000 MG/100ML IV EMUL
INTRAVENOUS | Status: AC
  Filled 2016-11-09: qty 100

## 2016-11-09 MED ORDER — HYDROMORPHONE HCL 1 MG/ML IJ SOLN: 1.0000 mg | INTRAMUSCULAR | Status: DC | PRN

## 2016-11-09 MED ORDER — OXYCODONE HCL 5 MG/5ML PO SOLN: 5.0000 mg | Freq: Once | ORAL | Status: DC | PRN

## 2016-11-09 MED ORDER — ACETAMINOPHEN 325 MG PO TABS: 650.0000 mg | ORAL_TABLET | ORAL | Status: DC | PRN

## 2016-11-09 MED ORDER — LIDOCAINE 2% (20 MG/ML) 5 ML SYRINGE
INTRAMUSCULAR | Status: AC
  Filled 2016-11-09: qty 10

## 2016-11-09 MED ORDER — 0.9 % SODIUM CHLORIDE (POUR BTL) OPTIME
TOPICAL | Status: DC | PRN
  Administered 2016-11-09: 1000 mL

## 2016-11-09 MED ORDER — FENTANYL CITRATE (PF) 250 MCG/5ML IJ SOLN
INTRAMUSCULAR | Status: AC
  Filled 2016-11-09: qty 5

## 2016-11-09 MED ORDER — ONDANSETRON HCL 4 MG/2ML IJ SOLN
INTRAMUSCULAR | Status: AC
  Filled 2016-11-09: qty 2

## 2016-11-09 MED ORDER — EPHEDRINE 5 MG/ML INJ
INTRAVENOUS | Status: AC
  Filled 2016-11-09: qty 10

## 2016-11-09 MED ORDER — OXYCODONE HCL 5 MG PO TABS: 5.0000 mg | ORAL_TABLET | Freq: Once | ORAL | Status: DC | PRN

## 2016-11-09 MED ORDER — DIPHENHYDRAMINE HCL 12.5 MG/5ML PO ELIX: 12.5000 mg | ORAL_SOLUTION | ORAL | Status: DC | PRN

## 2016-11-09 MED ORDER — CHLORHEXIDINE GLUCONATE 4 % EX LIQD: 60.0000 mL | Freq: Once | CUTANEOUS | Status: DC

## 2016-11-09 MED ORDER — METHOCARBAMOL 500 MG PO TABS
500.0000 mg | ORAL_TABLET | Freq: Four times a day (QID) | ORAL | Status: DC | PRN
  Administered 2016-11-09 – 2016-11-10 (×2): 500 mg via ORAL
  Filled 2016-11-09 (×2): qty 1

## 2016-11-09 MED ORDER — HYDROCODONE-ACETAMINOPHEN 5-325 MG PO TABS
1.0000 | ORAL_TABLET | ORAL | Status: DC | PRN
  Administered 2016-11-09: 1 via ORAL
  Filled 2016-11-09: qty 1

## 2016-11-09 MED ORDER — OXYCODONE HCL 5 MG PO TABS
ORAL_TABLET | ORAL | Status: AC
  Filled 2016-11-09: qty 2

## 2016-11-09 MED ORDER — METOCLOPRAMIDE HCL 5 MG PO TABS: 5.0000 mg | ORAL_TABLET | Freq: Three times a day (TID) | ORAL | Status: DC | PRN

## 2016-11-09 MED ORDER — SODIUM CHLORIDE 0.9 % IV SOLN: INTRAVENOUS | Status: DC

## 2016-11-09 MED ORDER — ALUM & MAG HYDROXIDE-SIMETH 200-200-20 MG/5ML PO SUSP: 30.0000 mL | ORAL | Status: DC | PRN

## 2016-11-09 MED ORDER — MENTHOL 3 MG MT LOZG: 1.0000 | LOZENGE | OROMUCOSAL | Status: DC | PRN

## 2016-11-09 MED ORDER — METOCLOPRAMIDE HCL 5 MG/ML IJ SOLN: 5.0000 mg | Freq: Three times a day (TID) | INTRAMUSCULAR | Status: DC | PRN

## 2016-11-09 MED ORDER — FENTANYL CITRATE (PF) 250 MCG/5ML IJ SOLN
INTRAMUSCULAR | Status: DC | PRN
  Administered 2016-11-09: 50 ug via INTRAVENOUS

## 2016-11-09 MED ORDER — ONDANSETRON HCL 4 MG/2ML IJ SOLN
INTRAMUSCULAR | Status: DC | PRN
  Administered 2016-11-09: 4 mg via INTRAVENOUS

## 2016-11-09 MED ORDER — CEFAZOLIN SODIUM-DEXTROSE 1-4 GM/50ML-% IV SOLN
1.0000 g | Freq: Four times a day (QID) | INTRAVENOUS | Status: AC
  Administered 2016-11-10: 1 g via INTRAVENOUS
  Filled 2016-11-09 (×2): qty 50

## 2016-11-09 MED ORDER — PHENYLEPHRINE 40 MCG/ML (10ML) SYRINGE FOR IV PUSH (FOR BLOOD PRESSURE SUPPORT)
PREFILLED_SYRINGE | INTRAVENOUS | Status: DC | PRN
  Administered 2016-11-09: 80 ug via INTRAVENOUS
  Administered 2016-11-09 (×3): 40 ug via INTRAVENOUS

## 2016-11-09 MED ORDER — ASPIRIN 81 MG PO CHEW
81.0000 mg | CHEWABLE_TABLET | Freq: Two times a day (BID) | ORAL | Status: DC
  Administered 2016-11-09 – 2016-11-11 (×4): 81 mg via ORAL
  Filled 2016-11-09 (×4): qty 1

## 2016-11-09 MED ORDER — SODIUM CHLORIDE 0.9 % IR SOLN
Status: DC | PRN
  Administered 2016-11-09: 3000 mL

## 2016-11-09 MED ORDER — LACTATED RINGERS IV SOLN
INTRAVENOUS | Status: DC | PRN
  Administered 2016-11-09 (×3): via INTRAVENOUS

## 2016-11-09 MED ORDER — PHENOL 1.4 % MT LIQD: 1.0000 | OROMUCOSAL | Status: DC | PRN

## 2016-11-09 MED ORDER — HYDROMORPHONE HCL 1 MG/ML IJ SOLN
0.2500 mg | INTRAMUSCULAR | Status: DC | PRN
  Administered 2016-11-09 (×2): 0.5 mg via INTRAVENOUS

## 2016-11-09 MED ORDER — PROPOFOL 10 MG/ML IV BOLUS
INTRAVENOUS | Status: DC | PRN
  Administered 2016-11-09 (×6): 20 mg via INTRAVENOUS

## 2016-11-09 MED ORDER — MIDAZOLAM HCL 2 MG/2ML IJ SOLN
INTRAMUSCULAR | Status: DC | PRN
  Administered 2016-11-09 (×2): 1 mg via INTRAVENOUS

## 2016-11-09 MED ORDER — ONDANSETRON HCL 4 MG/2ML IJ SOLN: 4.0000 mg | Freq: Four times a day (QID) | INTRAMUSCULAR | Status: DC | PRN

## 2016-11-09 MED ORDER — PHENYLEPHRINE HCL 10 MG/ML IJ SOLN
INTRAVENOUS | Status: DC | PRN
  Administered 2016-11-09: 25 ug/min via INTRAVENOUS

## 2016-11-09 MED ORDER — DEXTROSE 5 % IV SOLN
500.0000 mg | Freq: Four times a day (QID) | INTRAVENOUS | Status: DC | PRN
  Filled 2016-11-09: qty 5

## 2016-11-09 MED ORDER — ONDANSETRON HCL 4 MG PO TABS: 4.0000 mg | ORAL_TABLET | Freq: Four times a day (QID) | ORAL | Status: DC | PRN

## 2016-11-09 MED ORDER — PROPOFOL 10 MG/ML IV BOLUS
INTRAVENOUS | Status: AC
  Filled 2016-11-09: qty 20

## 2016-11-09 SURGICAL SUPPLY — 51 items
BENZOIN TINCTURE PRP APPL 2/3 (GAUZE/BANDAGES/DRESSINGS) ×2 IMPLANT
BLADE CLIPPER SURG (BLADE) IMPLANT
BLADE SAW SGTL 18X1.27X75 (BLADE) ×2 IMPLANT
CAPT HIP TOTAL 2 ×2 IMPLANT
CELLS DAT CNTRL 66122 CELL SVR (MISCELLANEOUS) ×1 IMPLANT
COVER SURGICAL LIGHT HANDLE (MISCELLANEOUS) ×2 IMPLANT
DRAPE C-ARM 42X72 X-RAY (DRAPES) ×2 IMPLANT
DRAPE STERI IOBAN 125X83 (DRAPES) ×2 IMPLANT
DRAPE U-SHAPE 47X51 STRL (DRAPES) ×6 IMPLANT
DRSG AQUACEL AG ADV 3.5X10 (GAUZE/BANDAGES/DRESSINGS) ×2 IMPLANT
DURAPREP 26ML APPLICATOR (WOUND CARE) ×2 IMPLANT
ELECT BLADE 4.0 EZ CLEAN MEGAD (MISCELLANEOUS) ×2
ELECT BLADE 6.5 EXT (BLADE) IMPLANT
ELECT REM PT RETURN 9FT ADLT (ELECTROSURGICAL) ×2
ELECTRODE BLDE 4.0 EZ CLN MEGD (MISCELLANEOUS) ×1 IMPLANT
ELECTRODE REM PT RTRN 9FT ADLT (ELECTROSURGICAL) ×1 IMPLANT
FACESHIELD WRAPAROUND (MASK) ×4 IMPLANT
GLOVE BIOGEL PI IND STRL 8 (GLOVE) ×2 IMPLANT
GLOVE BIOGEL PI INDICATOR 8 (GLOVE) ×2
GLOVE ECLIPSE 8.0 STRL XLNG CF (GLOVE) ×2 IMPLANT
GLOVE ORTHO TXT STRL SZ7.5 (GLOVE) ×4 IMPLANT
GOWN STRL REUS W/ TWL LRG LVL3 (GOWN DISPOSABLE) ×2 IMPLANT
GOWN STRL REUS W/ TWL XL LVL3 (GOWN DISPOSABLE) ×2 IMPLANT
GOWN STRL REUS W/TWL LRG LVL3 (GOWN DISPOSABLE) ×2
GOWN STRL REUS W/TWL XL LVL3 (GOWN DISPOSABLE) ×2
HANDPIECE INTERPULSE COAX TIP (DISPOSABLE) ×1
KIT BASIN OR (CUSTOM PROCEDURE TRAY) ×2 IMPLANT
KIT ROOM TURNOVER OR (KITS) ×2 IMPLANT
MANIFOLD NEPTUNE II (INSTRUMENTS) ×2 IMPLANT
NS IRRIG 1000ML POUR BTL (IV SOLUTION) ×2 IMPLANT
PACK TOTAL JOINT (CUSTOM PROCEDURE TRAY) ×2 IMPLANT
PAD ARMBOARD 7.5X6 YLW CONV (MISCELLANEOUS) ×2 IMPLANT
RTRCTR WOUND ALEXIS 18CM MED (MISCELLANEOUS) ×2
SET HNDPC FAN SPRY TIP SCT (DISPOSABLE) ×1 IMPLANT
STAPLER VISISTAT 35W (STAPLE) IMPLANT
STRIP CLOSURE SKIN 1/2X4 (GAUZE/BANDAGES/DRESSINGS) ×2 IMPLANT
SUT ETHIBOND NAB CT1 #1 30IN (SUTURE) ×2 IMPLANT
SUT MNCRL AB 3-0 PS2 18 (SUTURE) ×2 IMPLANT
SUT MNCRL AB 4-0 PS2 18 (SUTURE) IMPLANT
SUT VIC AB 0 CT1 27 (SUTURE) ×1
SUT VIC AB 0 CT1 27XBRD ANBCTR (SUTURE) ×1 IMPLANT
SUT VIC AB 1 CT1 27 (SUTURE) ×2
SUT VIC AB 1 CT1 27XBRD ANBCTR (SUTURE) ×2 IMPLANT
SUT VIC AB 2-0 CT1 27 (SUTURE) ×1
SUT VIC AB 2-0 CT1 TAPERPNT 27 (SUTURE) ×1 IMPLANT
TOWEL OR 17X24 6PK STRL BLUE (TOWEL DISPOSABLE) ×2 IMPLANT
TOWEL OR 17X26 10 PK STRL BLUE (TOWEL DISPOSABLE) ×2 IMPLANT
TRAY CATH 16FR W/PLASTIC CATH (SET/KITS/TRAYS/PACK) IMPLANT
TRAY FOLEY W/METER SILVER 16FR (SET/KITS/TRAYS/PACK) IMPLANT
TUBE CONNECTING 12X1/4 (SUCTIONS) ×2 IMPLANT
WATER STERILE IRR 1000ML POUR (IV SOLUTION) ×4 IMPLANT

## 2016-11-09 NOTE — Brief Op Note (Signed)
11/09/2016  2:03 PM  PATIENT:  Victor Ball  76 y.o. male  PRE-OPERATIVE DIAGNOSIS:  osteoarthritis left hip  POST-OPERATIVE DIAGNOSIS:  osteoarthritis left hip  PROCEDURE:  Procedure(s): LEFT TOTAL HIP ARTHROPLASTY ANTERIOR APPROACH (Left)  SURGEON:  Surgeon(s) and Role:    Mcarthur Rossetti, MD - Primary  PHYSICIAN ASSISTANT: Benita Stabile, PA-C  ANESTHESIA:   spinal  EBL:  300 mL   COUNTS:  YES  DICTATION: .Other Dictation: Dictation Number [id number]  PLAN OF CARE: Admit to inpatient   PATIENT DISPOSITION:  PACU - hemodynamically stable.   Delay start of Pharmacological VTE agent (>24hrs) due to surgical blood loss or risk of bleeding: no

## 2016-11-09 NOTE — Progress Notes (Signed)
Patient arrived to unit from PACU. Alert and oriented x4. Dressing to left hip C/D/I. Right forearm IV infusing. NO s/s of distress. Instructed to use call light for assist. Call light in reach.

## 2016-11-09 NOTE — Transfer of Care (Signed)
Immediate Anesthesia Transfer of Care Note  Patient: OMAREE FUQUA  Procedure(s) Performed: LEFT TOTAL HIP ARTHROPLASTY ANTERIOR APPROACH (Left Hip)  Patient Location: PACU  Anesthesia Type:Spinal  Level of Consciousness: awake and alert   Airway & Oxygen Therapy: Patient Spontanous Breathing and Patient connected to face mask oxygen  Post-op Assessment: Report given to RN and Post -op Vital signs reviewed and stable  Post vital signs: Reviewed and stable  Last Vitals:  Vitals:   11/09/16 1006  BP: (!) 185/96  Pulse: 60  Resp: 20  Temp: 36.5 C  SpO2: 96%    Last Pain:  Vitals:   11/09/16 1012  TempSrc:   PainSc: 0-No pain      Patients Stated Pain Goal: 3 (93/90/30 0923)  Complications: No apparent anesthesia complications

## 2016-11-09 NOTE — Anesthesia Preprocedure Evaluation (Addendum)
Anesthesia Evaluation  Patient identified by MRN, date of birth, ID band Patient awake    Reviewed: Allergy & Precautions, NPO status , Patient's Chart, lab work & pertinent test results  Airway Mallampati: II  TM Distance: >3 FB Neck ROM: Full    Dental  (+) Dental Advisory Given   Pulmonary former smoker,    breath sounds clear to auscultation       Cardiovascular hypertension,  Rhythm:Regular     Neuro/Psych    GI/Hepatic negative GI ROS, (+) Hepatitis -, C  Endo/Other    Renal/GU Renal disease     Musculoskeletal   Abdominal   Peds  Hematology   Anesthesia Other Findings   Reproductive/Obstetrics                           Anesthesia Physical Anesthesia Plan  ASA: III  Anesthesia Plan: Spinal   Post-op Pain Management:    Induction: Intravenous  PONV Risk Score and Plan: 2 and Ondansetron, Dexamethasone and Treatment may vary due to age or medical condition  Airway Management Planned: Natural Airway  Additional Equipment:   Intra-op Plan:   Post-operative Plan:   Informed Consent: I have reviewed the patients History and Physical, chart, labs and discussed the procedure including the risks, benefits and alternatives for the proposed anesthesia with the patient or authorized representative who has indicated his/her understanding and acceptance.     Plan Discussed with:   Anesthesia Plan Comments:         Anesthesia Quick Evaluation

## 2016-11-09 NOTE — H&P (Signed)
TOTAL HIP ADMISSION H&P  Patient is admitted for left total hip arthroplasty.  Subjective:  Chief Complaint: left hip pain  HPI: Victor Ball, 76 y.o. male, has a history of pain and functional disability in the left hip(s) due to arthritis and patient has failed non-surgical conservative treatments for greater than 12 weeks to include NSAID's and/or analgesics, corticosteriod injections, flexibility and strengthening excercises, supervised PT with diminished ADL's post treatment, use of assistive devices, weight reduction as appropriate and activity modification.  Onset of symptoms was gradual starting 3 years ago with gradually worsening course since that time.The patient noted no past surgery on the left hip(s).  Patient currently rates pain in the left hip at 10 out of 10 with activity. Patient has night pain, worsening of pain with activity and weight bearing, pain that interfers with activities of daily living and pain with passive range of motion. Patient has evidence of subchondral cysts, subchondral sclerosis, periarticular osteophytes and joint space narrowing by imaging studies. This condition presents safety issues increasing the risk of falls.  There is no current active infection.  Patient Active Problem List   Diagnosis Date Noted  . Pain of left hip joint 10/18/2016  . Unilateral primary osteoarthritis, left hip 10/18/2016  . Lumbar spondylosis 07/19/2016  . Healthcare maintenance 02/19/2016  . ED (erectile dysfunction) 02/19/2016  . Rash 02/19/2016  . Hyperglycemia 02/19/2016  . Advance care planning 01/06/2014  . Osteoarthritis, hip, bilateral 10/31/2012  . Medicare annual wellness visit, subsequent 10/24/2012  . Left hip pain 10/04/2012  . Elevated PSA 10/06/2011  . Elevated BP without diagnosis of hypertension   . Hyperlipidemia   . Kidney stone   . Hepatitis C, chronic (HCC)    Past Medical History:  Diagnosis Date  . Hepatitis C, chronic (HCC)    treated prev  with Harvoni, tested and cleared last year  . History of kidney stones   . Hyperlipidemia   . Hypertension    his family physician believes it is related to pain and will reassess after surgery-on no medications    Past Surgical History:  Procedure Laterality Date  . APPENDECTOMY  1948  . LIVER BIOPSY    . PROSTATE BIOPSY     neg, x2  . TOE FUSION  1985   left great toe  . TONSILLECTOMY AND ADENOIDECTOMY  1945  . VASECTOMY      Current Facility-Administered Medications  Medication Dose Route Frequency Provider Last Rate Last Dose  . ceFAZolin (ANCEF) IVPB 2g/100 mL premix  2 g Intravenous To SS-Surg Mcarthur Rossetti, MD      . chlorhexidine (HIBICLENS) 4 % liquid 4 application  60 mL Topical Once Erskine Emery W, PA-C      . tranexamic acid (CYKLOKAPRON) 1,000 mg in sodium chloride 0.9 % 100 mL IVPB  1,000 mg Intravenous To OR Mcarthur Rossetti, MD       No Known Allergies  Social History  Substance Use Topics  . Smoking status: Former Smoker    Quit date: 01/18/1973  . Smokeless tobacco: Never Used  . Alcohol use Yes     Comment: 1 drink a day    Family History  Problem Relation Age of Onset  . Heart disease Father   . Diabetes Father   . Celiac disease Sister   . Stroke Brother   . Heart disease Brother        MI then stents  . Celiac disease Brother   . Heart disease Brother   .  Cancer Paternal Aunt   . Prostate cancer Neg Hx   . Colon cancer Neg Hx      Review of Systems  Musculoskeletal: Positive for joint pain.  All other systems reviewed and are negative.   Objective:  Physical Exam  Constitutional: He is oriented to person, place, and time. He appears well-developed and well-nourished.  HENT:  Head: Normocephalic and atraumatic.  Eyes: Pupils are equal, round, and reactive to light. EOM are normal.  Neck: Normal range of motion. Neck supple.  Cardiovascular: Normal rate and regular rhythm.   Respiratory: Effort normal and breath sounds  normal.  GI: Soft. Bowel sounds are normal.  Musculoskeletal:       Left hip: He exhibits decreased range of motion, decreased strength, tenderness and bony tenderness.  Neurological: He is alert and oriented to person, place, and time.  Skin: Skin is warm and dry.  Psychiatric: He has a normal mood and affect.    Vital signs in last 24 hours: Temp:  [97.7 F (36.5 C)] 97.7 F (36.5 C) (10/23 1006) Pulse Rate:  [60] 60 (10/23 1006) Resp:  [20] 20 (10/23 1006) BP: (185)/(96) 185/96 (10/23 1006) SpO2:  [96 %] 96 % (10/23 1006) Weight:  [232 lb 3 oz (105.3 kg)] 232 lb 3 oz (105.3 kg) (10/23 1012)  Labs:   Estimated body mass index is 29.81 kg/m as calculated from the following:   Height as of this encounter: 6\' 2"  (1.88 m).   Weight as of this encounter: 232 lb 3 oz (105.3 kg).   Imaging Review Plain radiographs demonstrate severe degenerative joint disease of the left hip(s). The bone quality appears to be good for age and reported activity level.  Assessment/Plan:  End stage arthritis, left hip(s)  The patient history, physical examination, clinical judgement of the provider and imaging studies are consistent with end stage degenerative joint disease of the left hip(s) and total hip arthroplasty is deemed medically necessary. The treatment options including medical management, injection therapy, arthroscopy and arthroplasty were discussed at length. The risks and benefits of total hip arthroplasty were presented and reviewed. The risks due to aseptic loosening, infection, stiffness, dislocation/subluxation,  thromboembolic complications and other imponderables were discussed.  The patient acknowledged the explanation, agreed to proceed with the plan and consent was signed. Patient is being admitted for inpatient treatment for surgery, pain control, PT, OT, prophylactic antibiotics, VTE prophylaxis, progressive ambulation and ADL's and discharge planning.The patient is planning to be  discharged home with home health services

## 2016-11-09 NOTE — Anesthesia Procedure Notes (Signed)
Date/Time: 11/09/2016 12:31 PM Performed by: Cynda Familia Pre-anesthesia Checklist: Patient identified, Emergency Drugs available, Suction available, Patient being monitored and Timeout performed Oxygen Delivery Method: Simple face mask Placement Confirmation: positive ETCO2 and breath sounds checked- equal and bilateral Dental Injury: Teeth and Oropharynx as per pre-operative assessment  Comments: simple face mask for spinal/sedation

## 2016-11-09 NOTE — Anesthesia Procedure Notes (Signed)
Spinal  Patient location during procedure: OR Start time: 11/09/2016 12:35 PM End time: 11/09/2016 12:45 PM Staffing Anesthesiologist: Rica Koyanagi Performed: anesthesiologist  Preanesthetic Checklist Completed: patient identified, site marked, surgical consent, pre-op evaluation, timeout performed, IV checked, risks and benefits discussed and monitors and equipment checked Spinal Block Patient position: sitting Prep: ChloraPrep Patient monitoring: heart rate, cardiac monitor, continuous pulse ox and blood pressure Approach: right paramedian Location: L3-4 Injection technique: single-shot Needle Needle type: Quincke  Needle gauge: 22 G Needle length: 9 cm Needle insertion depth: 5 cm Additional Notes Slight heme tinged of CSF flow noted

## 2016-11-10 ENCOUNTER — Encounter (HOSPITAL_COMMUNITY): Payer: Self-pay | Admitting: Orthopaedic Surgery

## 2016-11-10 LAB — CBC
HEMATOCRIT: 40 % (ref 39.0–52.0)
Hemoglobin: 13.3 g/dL (ref 13.0–17.0)
MCH: 30.5 pg (ref 26.0–34.0)
MCHC: 33.3 g/dL (ref 30.0–36.0)
MCV: 91.7 fL (ref 78.0–100.0)
Platelets: 152 10*3/uL (ref 150–400)
RBC: 4.36 MIL/uL (ref 4.22–5.81)
RDW: 13 % (ref 11.5–15.5)
WBC: 9.9 10*3/uL (ref 4.0–10.5)

## 2016-11-10 LAB — BASIC METABOLIC PANEL
ANION GAP: 10 (ref 5–15)
BUN: 13 mg/dL (ref 6–20)
CO2: 27 mmol/L (ref 22–32)
Calcium: 8.4 mg/dL — ABNORMAL LOW (ref 8.9–10.3)
Chloride: 98 mmol/L — ABNORMAL LOW (ref 101–111)
Creatinine, Ser: 0.88 mg/dL (ref 0.61–1.24)
GFR calc Af Amer: >60 mL/min (ref >60)
GLUCOSE: 156 mg/dL — AB (ref 65–99)
POTASSIUM: 4.1 mmol/L (ref 3.5–5.1)
Sodium: 135 mmol/L (ref 135–145)

## 2016-11-10 NOTE — Evaluation (Signed)
Physical Therapy Evaluation Patient Details Name: Victor Ball MRN: [id number] DOB: [birth year] Today's Date: 11/10/2016   History of Present Illness  Victor Ball, 76 y.o. male, has a history of pain and functional disability in the left hip(s) due to arthritis and patient has failed non-surgical conservative treatments for greater than 12 weeks to include NSAID's and/or analgesics, corticosteriod injections, flexibility and strengthening excercises, supervised PT with diminished ADL's post treatment, use of assistive devices, weight reduction as appropriate and activity modification.  Onset of symptoms was gradual starting 3 years ago with gradually worsening course since that time.. [name] 11/09/16.  Clinical Impression  Pt is s/p THA resulting in the deficits listed below (see PT Problem List). Pt is limited by decreased L hip ROM and pain. Pt currently minA for bed mobility, transfers and ambulation of 50 ft with RW. Pt will benefit from skilled PT to increase their independence and safety with mobility to allow discharge to the venue listed below.      Follow Up Recommendations Home health PT;Supervision/Assistance - 24 hour    Equipment Recommendations  Rolling walker with 5" wheels    Recommendations for Other Services       Precautions / Restrictions Precautions Precautions: None Restrictions Weight Bearing Restrictions: Yes LLE Weight Bearing: Weight bearing as tolerated      Mobility  Bed Mobility Overal bed mobility: Needs Assistance Bed Mobility: Supine to Sit     Supine to sit: Min assist     General bed mobility comments: minA for trunk to upright and pad scoot of hips to EoB, vc for hand placement on bed rail to assist in coming to upright  Transfers Overall transfer level: Needs assistance Equipment used: Rolling walker (2 wheeled) Transfers: Sit to/from Stand Sit to Stand: Min assist         General transfer comment: minA for  power up and steadying at RW, vc for hand placement for powerup  Ambulation/Gait Ambulation/Gait assistance: Min assist Ambulation Distance (Feet): 50 Feet Assistive device: Rolling walker (2 wheeled) Gait Pattern/deviations: Step-to pattern;Step-through pattern;Decreased step length - right;Decreased stance time - left;Decreased weight shift to left;Antalgic;Trunk flexed Gait velocity: slowed Gait velocity interpretation: Below normal speed for age/gender General Gait Details: minA for steadying with RW, progressed from step to to step through pattern, vc for RW sequencing and increased UE to advance R LE      Balance Overall balance assessment: Needs assistance Sitting-balance support: No upper extremity supported;Feet supported Sitting balance-Leahy Scale: Good     Standing balance support: Bilateral upper extremity supported Standing balance-Leahy Scale: Poor Standing balance comment: requires RW support to maintain balance                             Pertinent Vitals/Pain Pain Assessment: No/denies pain    Home Living Family/patient expects to be discharged to:: Private residence Living Arrangements: Spouse/significant other Available Help at Discharge: Family;Available 24 hours/day Type of Home: Apartment Home Access: Level entry     Home Layout: One level Home Equipment: Walker - 2 wheels;Bedside commode;Adaptive equipment;Shower seat      Prior Function Level of Independence: Independent         Comments: limited Writer        Extremity/Trunk Assessment   Upper Extremity Assessment Upper Extremity Assessment: Overall WFL for tasks assessed    Lower Extremity Assessment Lower  Extremity Assessment: LLE deficits/detail LLE Deficits / Details: L hip ROM and strength limited by surgical pain, knee and ankle grossly 3/5 LLE: Unable to fully assess due to pain    Cervical / Trunk  Assessment Cervical / Trunk Assessment: Normal  Communication   Communication: No difficulties  Cognition Arousal/Alertness: Awake/alert Behavior During Therapy: WFL for tasks assessed/performed Overall Cognitive Status: Within Functional Limits for tasks assessed                                        General Comments General comments (skin integrity, edema, etc.): VSS throughout session        Assessment/Plan    PT Assessment Patient needs continued PT services  PT Problem List Decreased strength;Decreased range of motion;Decreased activity tolerance;Decreased balance;Decreased mobility;Decreased knowledge of use of DME;Decreased safety awareness;Pain       PT Treatment Interventions DME instruction;Gait training;Functional mobility training;Therapeutic activities;Therapeutic exercise;Balance training;Patient/family education    PT Goals (Current goals can be found in the Care Plan section)  Acute Rehab PT Goals Patient Stated Goal: go home PT Goal Formulation: With patient Potential to Achieve Goals: Good    Frequency 7X/week    AM-PAC PT "6 Clicks" Daily Activity  Outcome Measure Difficulty turning over in bed (including adjusting bedclothes, sheets and blankets)?: A Lot Difficulty moving from lying on back to sitting on the side of the bed? : Unable Difficulty sitting down on and standing up from a chair with arms (e.g., wheelchair, bedside commode, etc,.)?: Unable Help needed moving to and from a bed to chair (including a wheelchair)?: A Little Help needed walking in hospital room?: A Little Help needed climbing 3-5 steps with a railing? : Total 6 Click Score: 11    End of Session Equipment Utilized During Treatment: Gait belt Activity Tolerance: Patient limited by pain Patient left: in chair;with call bell/phone within reach;with family/visitor present Nurse Communication: Mobility status PT Visit Diagnosis: Unsteadiness on feet (R26.81);Other  abnormalities of gait and mobility (R26.89);Muscle weakness (generalized) (M62.81);Difficulty in walking, not elsewhere classified (R26.2);Pain Pain - Right/Left: Left Pain - part of body: Hip    Time: 0820-0857 PT Time Calculation (min) (ACUTE ONLY): 37 min   Charges:   PT Evaluation $PT Eval Low Complexity: 1 Low PT Treatments $Gait Training: 8-22 mins   PT G Codes:        Kassidy Dockendorf B. Migdalia Dk PT, DPT Acute Rehabilitation  [phone] Pager [phone]    [place] 11/10/2016, 10:08 AM

## 2016-11-10 NOTE — Progress Notes (Signed)
Subjective: 1 Day Post-Op Procedure(s) (LRB): LEFT TOTAL HIP ARTHROPLASTY ANTERIOR APPROACH (Left) Patient reports pain as moderate.    Objective: Vital signs in last 24 hours: Temp:  [97 F (36.1 C)-98.9 F (37.2 C)] 98.1 F (36.7 C) (10/24 0505) Pulse Rate:  [53-93] 85 (10/24 0505) Resp:  [10-20] 17 (10/24 0505) BP: (129-185)/(71-96) 148/71 (10/24 0505) SpO2:  [93 %-100 %] 93 % (10/24 0505) Weight:  [232 lb 3 oz (105.3 kg)] 232 lb 3 oz (105.3 kg) (10/23 1012)  Intake/Output from previous day: 10/23 0701 - 10/24 0700 In: 3430 [P.O.:480; I.V.:2900; IV Piggyback:50] Out: 1900 [Urine:1600; Blood:300] Intake/Output this shift: No intake/output data recorded.   Recent Labs  11/10/16 0344  HGB 13.3    Recent Labs  11/10/16 0344  WBC 9.9  RBC 4.36  HCT 40.0  PLT 152    Recent Labs  11/10/16 0344  NA 135  K 4.1  CL 98*  CO2 27  BUN 13  CREATININE 0.88  GLUCOSE 156*  CALCIUM 8.4*   No results for input(s): LABPT, INR in the last 72 hours.  Sensation intact distally Intact pulses distally Dorsiflexion/Plantar flexion intact Incision: scant drainage  Assessment/Plan: 1 Day Post-Op Procedure(s) (LRB): LEFT TOTAL HIP ARTHROPLASTY ANTERIOR APPROACH (Left) Up with therapy  Mcarthur Rossetti 11/10/2016, 7:37 AM

## 2016-11-10 NOTE — Anesthesia Postprocedure Evaluation (Signed)
Anesthesia Post Note  Patient: Victor Ball  Procedure(s) Performed: LEFT TOTAL HIP ARTHROPLASTY ANTERIOR APPROACH (Left Hip)     Patient location during evaluation: PACU Anesthesia Type: Spinal Level of consciousness: oriented, awake and sedated Pain management: pain level controlled Vital Signs Assessment: post-procedure vital signs reviewed and stable Respiratory status: spontaneous breathing, respiratory function stable and patient connected to nasal cannula oxygen Cardiovascular status: blood pressure returned to baseline and stable Postop Assessment: no headache, no backache and no apparent nausea or vomiting Anesthetic complications: no    Last Vitals:  Vitals:   11/10/16 0029 11/10/16 0505  BP: (!) 151/72 (!) 148/71  Pulse: 93 85  Resp: 15 17  Temp: 37.1 C 36.7 C  SpO2: 94% 93%    Last Pain:  Vitals:   11/10/16 0650  TempSrc:   PainSc: Asleep                 Heidee Audi,JAMES TERRILL

## 2016-11-10 NOTE — Progress Notes (Signed)
Physical Therapy Treatment Patient Details Name: Victor Ball MRN: [id number] DOB: 1940/12/04 Today's Date: 11/10/2016    History of Present Illness Victor Ball, 76 y.o. male, has a history of pain and functional disability in the left hip(s) due to arthritis and patient has failed non-surgical conservative treatments for greater than 12 weeks to include NSAID's and/or analgesics, corticosteriod injections, flexibility and strengthening excercises, supervised PT with diminished ADL's post treatment, use of assistive devices, weight reduction as appropriate and activity modification.  Onset of symptoms was gradual starting 3 years ago with gradually worsening course since that time.. [name] 11/09/16.    PT Comments    Pt is making good progress towards his goals. Pt is currently min guard for transfers and ambulation of 250 feet with RW. Pt main limitation in mobility is L hip pain. Pt requires skilled PT to progress mobility and improve strength and endurance to safely navigate their discharge environment.    Follow Up Recommendations  Home health PT;Supervision/Assistance - 24 hour     Equipment Recommendations  Rolling walker with 5" wheels    Recommendations for Other Services       Precautions / Restrictions Precautions Precautions: None Restrictions Weight Bearing Restrictions: Yes LLE Weight Bearing: Weight bearing as tolerated    Mobility  Bed Mobility Overal bed mobility: Needs Assistance Bed Mobility: Supine to Sit     Supine to sit: Min assist     General bed mobility comments: minA for trunk to upright and pad scoot of hips to EoB, vc for hand placement on bed rail to assist in coming to upright  Transfers Overall transfer level: Needs assistance Equipment used: Rolling walker (2 wheeled) Transfers: Sit to/from Stand Sit to Stand: Min guard         General transfer comment: min guard for safety, good power up and steadying    Ambulation/Gait Ambulation/Gait assistance: Min guard Ambulation Distance (Feet): 250 Feet Assistive device: Rolling walker (2 wheeled) Gait Pattern/deviations: Step-to pattern;Step-through pattern;Decreased step length - right;Decreased stance time - left;Decreased weight shift to left;Antalgic;Trunk flexed Gait velocity: slowed Gait velocity interpretation: Below normal speed for age/gender General Gait Details: hands on min guard for safety, vc for sequencing and looking up and out with gait       Balance Overall balance assessment: Needs assistance Sitting-balance support: No upper extremity supported;Feet supported Sitting balance-Leahy Scale: Good     Standing balance support: Bilateral upper extremity supported Standing balance-Leahy Scale: Poor Standing balance comment: requires RW support to maintain balance                            Cognition Arousal/Alertness: Awake/alert Behavior During Therapy: WFL for tasks assessed/performed Overall Cognitive Status: Within Functional Limits for tasks assessed                                        Exercises Total Joint Exercises Hip ABduction/ADduction: Standing;10 reps;AROM;Left Knee Flexion: AROM;Left;10 reps;Standing Marching in Standing: AROM;10 reps;Seated Standing Hip Extension: AROM;Both;10 reps;Standing    General Comments General comments (skin integrity, edema, etc.): VSS throughout session      Pertinent Vitals/Pain Pain Assessment: 0-10 Pain Score: 5  Pain Location: L hip with movement Pain Descriptors / Indicators: Stabbing;Throbbing Pain Intervention(s): Limited activity within patient's tolerance;Monitored during session  PT Goals (current goals can now be found in the care plan section) Acute Rehab PT Goals Patient Stated Goal: go home PT Goal Formulation: With patient Potential to Achieve Goals: Good Progress towards PT goals: Progressing toward goals     Frequency    7X/week      PT Plan Current plan remains appropriate       AM-PAC PT "6 Clicks" Daily Activity  Outcome Measure  Difficulty turning over in bed (including adjusting bedclothes, sheets and blankets)?: A Lot Difficulty moving from lying on back to sitting on the side of the bed? : Unable Difficulty sitting down on and standing up from a chair with arms (e.g., wheelchair, bedside commode, etc,.)?: A Lot Help needed moving to and from a bed to chair (including a wheelchair)?: A Little Help needed walking in hospital room?: A Little Help needed climbing 3-5 steps with a railing? : Total 6 Click Score: 12    End of Session Equipment Utilized During Treatment: Gait belt Activity Tolerance: Patient limited by pain Patient left: in chair;with call bell/phone within reach;with family/visitor present Nurse Communication: Mobility status PT Visit Diagnosis: Unsteadiness on feet (R26.81);Other abnormalities of gait and mobility (R26.89);Muscle weakness (generalized) (M62.81);Difficulty in walking, not elsewhere classified (R26.2);Pain Pain - Right/Left: Left Pain - part of body: Hip     Time: 1400-1425 PT Time Calculation (min) (ACUTE ONLY): 25 min  Charges:  $Gait Training: 8-22 mins $Therapeutic Activity: 8-22 mins                    G Codes:       Tambi Thole B. Migdalia Dk PT, DPT Acute Rehabilitation  [phone] Pager [phone]     [place] 11/10/2016, 2:37 PM

## 2016-11-10 NOTE — Evaluation (Addendum)
Occupational Therapy Evaluation Patient Details Name: Victor Ball MRN: [id number] DOB: 08/25/1940 Today's Date: 11/10/2016    History of Present Illness PRIEST LOCKRIDGE, 76 y.o. male, has a history of pain and functional disability in the left hip(s) due to arthritis and patient has failed non-surgical conservative treatments for greater than 12 weeks to include NSAID's and/or analgesics, corticosteriod injections, flexibility and strengthening excercises, supervised PT with diminished ADL's post treatment, use of assistive devices, weight reduction as appropriate and activity modification.  Onset of symptoms was gradual starting 3 years ago with gradually worsening course since that time.. [name] 11/09/16.   Clinical Impression   This 76 y/o M presents with the above. At baseline Pt is independent with ADLs and functional mobility. Pt currently requires MinGuard assist for functional mobility at RW level, ModA for LB ADLs secondary to LLE functional limitations. Pt will return home with spouse who is able to assist with ADLs PRN. Will continue to follow acutely to progress Pt's safety and independence with ADLs and mobility.     Follow Up Recommendations  DC plan and follow up therapy as arranged by surgeon;Supervision/Assistance - 24 hour    Equipment Recommendations  None recommended by OT           Precautions / Restrictions Precautions Precautions: Fall Restrictions Weight Bearing Restrictions: Yes LLE Weight Bearing: Weight bearing as tolerated      Mobility Bed Mobility Overal bed mobility: Needs Assistance Bed Mobility: Supine to Sit;Sit to Supine     Supine to sit: Min guard Sit to supine: Min guard   General bed mobility comments: close guard for safety; educated Pt to use sheet as a support for LLE to advance LE onto and off of bed; increased time and effort   Transfers Overall transfer level: Needs assistance Equipment used: Rolling walker (2  wheeled) Transfers: Sit to/from Stand Sit to Stand: Min guard         General transfer comment: min guard for safety, good power up and steadying; cues for use of UEs to control descent to sitting    Balance Overall balance assessment: Needs assistance Sitting-balance support: No upper extremity supported;Feet supported Sitting balance-Leahy Scale: Good     Standing balance support: Bilateral upper extremity supported Standing balance-Leahy Scale: Fair Standing balance comment: requires RW support to maintain balance during mobility; able to maintain static standing during grooming ADLs                           ADL either performed or assessed with clinical judgement   ADL Overall ADL's : Needs assistance/impaired Eating/Feeding: Independent;Sitting   Grooming: Min guard;Standing   Upper Body Bathing: Min guard;Sitting   Lower Body Bathing: Minimal assistance;Sit to/from stand   Upper Body Dressing : Min guard;Sitting   Lower Body Dressing: Moderate assistance;Sit to/from stand Lower Body Dressing Details (indicate cue type and reason): educated Pt on use of reacher for LB dressing; will plan to practice in next session  Toilet Transfer: Minimal assistance;Ambulation;RW Toilet Transfer Details (indicate cue type and reason): simulated in transfer to/from recliner  Toileting- Water quality scientist and Hygiene: Min guard;Sit to/from Nurse, children's Details (indicate cue type and reason): educated on how to safely perform shower transfer, will plan to practice during next session  Functional mobility during ADLs: Min guard;Rolling walker General ADL Comments: educated on compensatory techniques for completing ADLs and functional mobility transfers  Pertinent Vitals/Pain Pain Assessment: Faces Pain Score: 5  Faces Pain Scale: Hurts even more Pain Location: L hip with movement Pain Descriptors / Indicators:  Stabbing;Throbbing Pain Intervention(s): Monitored during session;Limited activity within patient's tolerance;Ice applied          Extremity/Trunk Assessment Upper Extremity Assessment Upper Extremity Assessment: Overall WFL for tasks assessed   Lower Extremity Assessment Lower Extremity Assessment: Defer to PT evaluation   Cervical / Trunk Assessment Cervical / Trunk Assessment: Normal   Communication Communication Communication: No difficulties   Cognition Arousal/Alertness: Awake/alert Behavior During Therapy: WFL for tasks assessed/performed Overall Cognitive Status: Within Functional Limits for tasks assessed                                     General Comments  VSS throghout session; spouse present during session               Home Living Family/patient expects to be discharged to:: Private residence Living Arrangements: Spouse/significant other Available Help at Discharge: Family;Available 24 hours/day Type of Home: Apartment Home Access: Level entry     Home Layout: One level     Bathroom Shower/Tub: Occupational psychologist: Handicapped height Bathroom Accessibility: Yes   Home Equipment: Environmental consultant - 2 wheels;Bedside commode;Adaptive equipment;Shower seat Adaptive Equipment: Reacher;Long-handled shoe horn        Prior Functioning/Environment Level of Independence: Independent        Comments: limited community ambulator and driver        OT Problem List: Decreased range of motion;Decreased activity tolerance;Decreased knowledge of use of DME or AE;Pain;Impaired balance (sitting and/or standing)      OT Treatment/Interventions: Self-care/ADL training;DME and/or AE instruction;Therapeutic activities;Balance training;Therapeutic exercise;Energy conservation;Patient/family education    OT Goals(Current goals can be found in the care plan section) Acute Rehab OT Goals Patient Stated Goal: go home OT Goal Formulation: With  patient Time For Goal Achievement: 11/24/16 Potential to Achieve Goals: Good ADL Goals Pt Will Perform Grooming: with modified independence;standing Pt Will Perform Upper Body Bathing: with modified independence;sitting Pt Will Perform Lower Body Bathing: with modified independence;sit to/from stand Pt Will Perform Lower Body Dressing: with modified independence;with adaptive equipment;sit to/from stand Pt Will Transfer to Toilet: with modified independence;ambulating;bedside commode (BSC over toilet ) Pt Will Perform Toileting - Clothing Manipulation and hygiene: with modified independence;sit to/from stand Pt Will Perform Tub/Shower Transfer: Shower transfer;with supervision;ambulating;shower seat;rolling walker  OT Frequency: Min 2X/week                             AM-PAC PT "6 Clicks" Daily Activity     Outcome Measure Help from another person eating meals?: None Help from another person taking care of personal grooming?: A Little Help from another person toileting, which includes using toliet, bedpan, or urinal?: A Little Help from another person bathing (including washing, rinsing, drying)?: A Little Help from another person to put on and taking off regular upper body clothing?: None Help from another person to put on and taking off regular lower body clothing?: A Little 6 Click Score: 20   End of Session Equipment Utilized During Treatment: Gait belt;Rolling walker Nurse Communication: Mobility status  Activity Tolerance: Patient tolerated treatment well Patient left: in chair;with call bell/phone within reach;with family/visitor present  OT Visit Diagnosis: Other abnormalities of gait and mobility (R26.89);Pain Pain - Right/Left: Left Pain -  part of body: Hip                Time: 1422-1456 OT Time Calculation (min): 34 min Charges:  OT General Charges $OT Visit: 1 Visit OT Evaluation $OT Eval Low Complexity: 1 Low OT Treatments $Self Care/Home Management :  8-22 mins G-Codes:     Lou Cal, OT Pager [phone] 11/10/2016   Raymondo Band 11/10/2016, 4:34 PM

## 2016-11-11 MED ORDER — HYDROCODONE-ACETAMINOPHEN 5-325 MG PO TABS: 1.0000 | ORAL_TABLET | ORAL | 0 refills | Status: DC | PRN

## 2016-11-11 MED ORDER — METHOCARBAMOL 500 MG PO TABS: 500.0000 mg | ORAL_TABLET | Freq: Four times a day (QID) | ORAL | 1 refills | Status: DC | PRN

## 2016-11-11 MED ORDER — ASPIRIN 81 MG PO CHEW: 81.0000 mg | CHEWABLE_TABLET | Freq: Two times a day (BID) | ORAL | 0 refills | Status: DC

## 2016-11-11 NOTE — Progress Notes (Signed)
Subjective: 2 Days Post-Op Procedure(s) (LRB): LEFT TOTAL HIP ARTHROPLASTY ANTERIOR APPROACH (Left) Patient reports pain as mild.  Wants to go home today . No complaints.   Objective: Vital signs in last 24 hours: Temp:  [98.7 F (37.1 C)-99.1 F (37.3 C)] 99.1 F (37.3 C) (10/25 0431) Pulse Rate:  [82-97] 97 (10/25 0431) Resp:  [15-17] 15 (10/25 0431) BP: (126-147)/(68-77) 147/77 (10/25 0431) SpO2:  [92 %-95 %] 95 % (10/25 0431)  Intake/Output from previous day: 10/24 0701 - 10/25 0700 In: 1200 [P.O.:1200] Out: 1300 [Urine:1300] Intake/Output this shift: Total I/O In: 240 [P.O.:240] Out: 200 [Urine:200]   Recent Labs  11/10/16 0344  HGB 13.3    Recent Labs  11/10/16 0344  WBC 9.9  RBC 4.36  HCT 40.0  PLT 152    Recent Labs  11/10/16 0344  NA 135  K 4.1  CL 98*  CO2 27  BUN 13  CREATININE 0.88  GLUCOSE 156*  CALCIUM 8.4*   No results for input(s): LABPT, INR in the last 72 hours.  Intact pulses distally Dorsiflexion/Plantar flexion intact Incision: dressing C/D/I Compartment soft  Assessment/Plan: 2 Days Post-Op Procedure(s) (LRB): LEFT TOTAL HIP ARTHROPLASTY ANTERIOR APPROACH (Left) Discharge home with home health  [place] 11/11/2016, 2:07 PM

## 2016-11-11 NOTE — Care Management Note (Signed)
Case Management Note  Patient Details  Name: Victor Ball MRN: [id number] Date of Birth: Dec 13, 1940  Subjective/Objective:     76 yr old gentleman s/p left total hip arthroplasty.              Action/Plan: Case manager spoke with patient and his family concerning discharge plan and DME. Patient was preoperatively setup with [facility], no changes. Has RW and 3in1. Patient will have support of his family at discharge,    Expected Discharge Date:  11/11/16               Expected Discharge Plan:  [place]  In-House Referral:  NA  Discharge planning Services  CM Consult  Post Acute Care Choice:  Home Health Choice offered to:  Patient  DME Arranged:  N/A (has RW 3in1) DME Agency:  NA  HH Arranged:  PT [place] Agency:  [facility] (formerly [employer])  Status of Service:  Completed, signed off  If discussed at [employer], dates discussed:    Additional Comments:  Ninfa Meeker, RN 11/11/2016, 2:53 PM

## 2016-11-11 NOTE — Progress Notes (Signed)
Occupational Therapy Treatment Patient Details Name: Victor Ball MRN: [id number] DOB: 30-Aug-1940 Today's Date: 11/11/2016    History of present illness Victor Ball, 76 y.o. male, has a history of pain and functional disability in the left hip(s) due to arthritis and patient has failed non-surgical conservative treatments for greater than 12 weeks to include NSAID's and/or analgesics, corticosteriod injections, flexibility and strengthening excercises, supervised PT with diminished ADL's post treatment, use of assistive devices, weight reduction as appropriate and activity modification.  Onset of symptoms was gradual starting 3 years ago with gradually worsening course since that time.. [name] 11/09/16.   OT comments  Pt progressing towards acute OT goals. Focus of session was functional transfers and review of LB ADL education. Spouse and daughter present. D/c plan remains appropriate.    Follow Up Recommendations  DC plan and follow up therapy as arranged by surgeon;Supervision/Assistance - 24 hour    Equipment Recommendations  None recommended by OT    Recommendations for Other Services      Precautions / Restrictions Precautions Precautions: Fall Restrictions Weight Bearing Restrictions: Yes LLE Weight Bearing: Weight bearing as tolerated       Mobility Bed Mobility               General bed mobility comments: up in chair  Transfers Overall transfer level: Needs assistance Equipment used: Rolling walker (2 wheeled) Transfers: Sit to/from Stand Sit to Stand: Min guard         General transfer comment: from recliner 2x.     Balance Overall balance assessment: Needs assistance Sitting-balance support: No upper extremity supported;Feet supported Sitting balance-Leahy Scale: Fair Sitting balance - Comments: vc and min assist needed for sitting posture during seated marches due to discomfort in L hip   Standing balance support: Bilateral  upper extremity supported Standing balance-Leahy Scale: Fair Standing balance comment: requires RW support to maintain balance during mobility                           ADL either performed or assessed with clinical judgement   ADL Overall ADL's : Needs assistance/impaired                     Lower Body Dressing: Minimal assistance;Sit to/from stand Lower Body Dressing Details (indicate cue type and reason): educated Pt on use of reacher for LB dressing; pt plans to have family assist. practiced sit<>transfer from recliner           Tub/Shower Transfer Details (indicate cue type and reason): pt reports walkin shower and declined practice. Discussed technique and having someone with him at home for this for safety.  Functional mobility during ADLs: Min guard;Rolling walker General ADL Comments: Pt completed in-room functional mobility and 2x sit<>stand transfers. Reviewed ADL education with spouse nd daughter present. Pt plans to have family help with LB ADLs and declined practiving shower transfer.      Vision       Perception     Praxis      Cognition Arousal/Alertness: Awake/alert Behavior During Therapy: WFL for tasks assessed/performed Overall Cognitive Status: Within Functional Limits for tasks assessed                                          Exercises Total Joint Exercises Hip ABduction/ADduction: Standing;10  reps;AROM;Left (Standing; 5 reps; AROM; R) Knee Flexion: AROM;Left;10 reps;Standing (Standing; AROM 5 reps; R) Marching in Standing: 10 reps;AAROM;Left;5 reps;Seated (AROM; 10 reps; R; seated) Standing Hip Extension: AROM;10 reps;Standing;Left (AROM; R; 5 reps; Standing )   Shoulder Instructions       General Comments      Pertinent Vitals/ Pain       Pain Assessment: Faces Faces Pain Scale: Hurts a little bit Pain Location: L hip  Pain Descriptors / Indicators:  (Strain, no pain) Pain Intervention(s): Monitored  during session;Repositioned  Home Living                                          Prior Functioning/Environment              Frequency  Min 2X/week        Progress Toward Goals  OT Goals(current goals can now be found in the care plan section)  Progress towards OT goals: Progressing toward goals  Acute Rehab OT Goals Patient Stated Goal: go home OT Goal Formulation: With patient Time For Goal Achievement: 11/24/16 Potential to Achieve Goals: Good ADL Goals Pt Will Perform Grooming: with modified independence;standing Pt Will Perform Upper Body Bathing: with modified independence;sitting Pt Will Perform Lower Body Bathing: with modified independence;sit to/from stand Pt Will Perform Lower Body Dressing: with modified independence;with adaptive equipment;sit to/from stand Pt Will Transfer to Toilet: with modified independence;ambulating;bedside commode Pt Will Perform Toileting - Clothing Manipulation and hygiene: with modified independence;sit to/from stand Pt Will Perform Tub/Shower Transfer: Shower transfer;with supervision;ambulating;shower seat;rolling walker  Plan Discharge plan remains appropriate    Co-evaluation                 AM-PAC PT "6 Clicks" Daily Activity     Outcome Measure   Help from another person eating meals?: None Help from another person taking care of personal grooming?: A Little Help from another person toileting, which includes using toliet, bedpan, or urinal?: A Little Help from another person bathing (including washing, rinsing, drying)?: A Little Help from another person to put on and taking off regular upper body clothing?: None Help from another person to put on and taking off regular lower body clothing?: A Little 6 Click Score: 20    End of Session Equipment Utilized During Treatment: Rolling walker  OT Visit Diagnosis: Other abnormalities of gait and mobility (R26.89);Pain Pain - Right/Left: Left Pain  - part of body: Hip   Activity Tolerance Patient tolerated treatment well   Patient Left in chair;with call bell/phone within reach;with family/visitor present   Nurse Communication          Time: 4098-1191 OT Time Calculation (min): 10 min  Charges: OT General Charges $OT Visit: 1 Visit OT Treatments $Self Care/Home Management : 8-22 mins     Hortencia Pilar 11/11/2016, 1:28 PM

## 2016-11-11 NOTE — Op Note (Signed)
[name], [name]                  ACCOUNT NO.:  [id number]  MEDICAL RECORD NO.:  [id number]  LOCATION:                                 FACILITY:  PHYSICIAN:  Lind Guest. Ninfa Linden, M.D.DATE OF BIRTH:  [birth year]  DATE OF PROCEDURE:  11/09/2016 DATE OF DISCHARGE:                              OPERATIVE REPORT   POSTOPERATIVE DIAGNOSES:  Primary osteoarthritis and degenerative joint disease, left hip.  POSTOPERATIVE DIAGNOSES:  Primary osteoarthritis and degenerative joint disease, left hip.  PROCEDURE:  Left total hip arthroplasty through direct anterior approach.  IMPLANTS:  DePuy Sector Gription acetabular component size 62, size 36/+4 polyethylene liner, size 14 Corail femoral component with varus offset, size 36/+5 ceramic hip ball.  SURGEON:  Lind Guest. Ninfa Linden, MD.  ASSISTANT:  Erskine Emery, PA-C.  ANESTHESIA:  Spinal.  ANTIBIOTICS:  2 g of IV Ancef.  BLOOD LOSS:  100 mL.  COMPLICATIONS:  None.  INDICATIONS:  [name] is a very pleasant 76 year old gentleman with debilitating arthritis involving his both hips with the left much worse than the right.  This has been bothering him for many years now.  His pain is daily and it has detrimentally affected his activities of daily living, his quality of life, and his mobility.  His x-ray showed complete loss of the hip space on both hips.  He has severe periarticular osteophytes and sclerotic changes as well.  At this point, we recommended a total hip arthroplasty.  He agrees with this as well. He understands our goals are to decrease pain, improve mobility, and overall improved quality of life.  He understands the risk of acute blood loss anemia, nerve and vessel injury, fracture, infection, dislocation, and DVT.  PROCEDURE DESCRIPTION:  After informed consent was obtained, appropriate left hip was marked.  He was brought to the operating room.  Spinal anesthesia was obtained while he was on the stretcher.   He was laid supine on a stretcher.  A Foley catheter was placed and both feet had traction boots applied to them.  Next, he was placed supine on the Hana fracture table, the perineal post in place, and both legs in inline skeletal traction devices, but no traction applied.  His left operative hip was prepped and draped with DuraPrep and sterile drapes.  Time-out was called and he was identified as correct patient and correct left hip.  We then made an incision just inferior and posterior to the anterior superior iliac spine and carried this obliquely down the leg. We dissected down tensor fascia latae muscle.  The tensor fascia was then divided longitudinally to proceed with a direct anterior approach to the hip.  We identified and cauterized circumflex vessels and identified the hip capsule.  I opened up the hip capsule in an L-type format, finding a moderate joint effusion and significant arthritis throughout the entire left hip.  We then made our femoral neck cut with an oscillating saw after we placed Cobra retractors around the medial and lateral femoral neck.  The femoral neck cut was made proximal to the lesser trochanter and I completed this with an osteotome.  I placed a corkscrew guide in the femoral head  and removed the femoral head in its entirety and found it to be completely devoid of cartilage.  I then cleaned the acetabular remnants of the acetabular labrum and other debris.  I then began reaming from a size 42 reamer and then jumped increments through several sizes going all the way up to a size 62 with all reamers under direct visualization and the last 2 reamers under direct fluoroscopy, so we could obtain our depth of reaming, our inclination, and anteversion.  Once I was pleased with this, I placed the real DePuy Sector Gription acetabular component size 62 and a 36/+4 neutral polyethylene liner for that size acetabular component. Attention was then turned to the  femur.  With the leg externally rotated to 120 degrees, extended, and adducted, we were able to place a Mueller retractor medially and a Hohmann retractor behind the greater trochanter.  We released the joint capsule and used a box cutting osteotome to enter the femoral canal and a rongeur to lateralize.  We then began broaching from a size 8 broach using the Corail broaching system up to a size 14.  With the 14 in place, we trialed a varus offset femoral neck and a 36/+1.5 hip ball.  We brought the leg back over and up with traction and internal rotation reducing the pelvis and I felt like he needed just a little bit more offset and leg length.  We then dislocated the hip and removed the trial components.  We were able to place the real Corail femoral component size 14 with varus offset and the real 36/+5 ceramic hip ball, reduced this in the acetabulum and I was pleased with leg length, offset, stability, and range of motion.  We then irrigated the soft tissue with normal saline solution using pulsatile lavage.  I was able to close the joint capsule with interrupted #1 Ethibond suture followed by running #1 Vicryl on the tensor fascia, 0 Vicryl on the deep tissue, 2-0 Vicryl on the subcutaneous tissue, 4-0 Monocryl subcuticular stitch, and Steri-Strips on the skin.  An Aquacel dressing was applied.  He was taken off the Hana table and taken to the recovery room in stable condition.  All final counts were correct.  There were no complications noted.  Of note, Erskine Emery, PA-C, assisted the entire case.  His assistance was crucial for facilitating all aspects of this case.     Lind Guest. Ninfa Linden, M.D.   ______________________________ Lind Guest. Ninfa Linden, M.D.    CYB/MEDQ  D:  11/09/2016  T:  11/09/2016  Job:  [id number]

## 2016-11-11 NOTE — Progress Notes (Signed)
Physical Therapy Treatment Patient Details Name: Victor Ball MRN: [id number] DOB: 11-27-1940 Today's Date: 11/11/2016    History of Present Illness Victor Ball, 76 y.o. male, has a history of pain and functional disability in the left hip(s) due to arthritis and patient has failed non-surgical conservative treatments for greater than 12 weeks to include NSAID's and/or analgesics, corticosteriod injections, flexibility and strengthening excercises, supervised PT with diminished ADL's post treatment, use of assistive devices, weight reduction as appropriate and activity modification.  Onset of symptoms was gradual starting 3 years ago with gradually worsening course since that time.. [name] 11/09/16.    PT Comments    Pt completed all activities today with no reports of increased pain. Objective measure of pt facial expressions did not correlate. Was eager for PT this AM. Pt demonstrated proper sit to stand/stand to sit transfers with min VC. Min guard for safety and VC needed for sequencing with gait. Pt easily loses sequence when distracted.  strengthen B LE today, with a  focus on L. Continue LE strengthening next session.   Follow Up Recommendations  Home health PT;Supervision/Assistance - 24 hour     Equipment Recommendations  Rolling walker with 5" wheels    Recommendations for Other Services       Precautions / Restrictions Precautions Precautions: Fall Restrictions Weight Bearing Restrictions: Yes LLE Weight Bearing: Weight bearing as tolerated    Mobility  Bed Mobility               General bed mobility comments: up in chair  Transfers Overall transfer level: Needs assistance Equipment used: Rolling walker (2 wheeled) Transfers: Sit to/from Stand Sit to Stand: Min guard         General transfer comment: from recliner 2x.   Ambulation/Gait Ambulation/Gait assistance: Min guard Ambulation Distance (Feet): 220 Feet   Gait  Pattern/deviations: Step-to pattern;Step-through pattern;Decreased step length - right;Decreased stance time - left;Decreased weight shift to left;Antalgic;Trunk flexed     General Gait Details: min guard for safety, vc needed for proper gait sequence and posture.    Stairs Stairs: Yes (curb)   Stair Management: No rails;With walker Number of Stairs: 4 (Curbs) General stair comments: Min guard for safety. min vc needed for proper technique with walker. Pt able to demonstrate  without cues by end of activity.   Wheelchair Mobility    Modified Rankin (Stroke Patients Only)       Balance Overall balance assessment: Needs assistance Sitting-balance support: No upper extremity supported;Feet supported Sitting balance-Leahy Scale: Fair Sitting balance - Comments: vc and min assist needed for sitting posture during seated marches due to discomfort in L hip   Standing balance support: Bilateral upper extremity supported Standing balance-Leahy Scale: Fair Standing balance comment: requires RW support to maintain balance during mobility                            Cognition Arousal/Alertness: Awake/alert Behavior During Therapy: WFL for tasks assessed/performed Overall Cognitive Status: Within Functional Limits for tasks assessed                                        Exercises Total Joint Exercises Hip ABduction/ADduction: Standing;10 reps;AROM;Left (Standing; 5 reps; AROM; R) Knee Flexion: AROM;Left;10 reps;Standing (Standing; AROM 5 reps; R) Marching in Standing: 10 reps;AAROM;Left;5 reps;Seated (AROM; 10 reps;  R; seated) Standing Hip Extension: AROM;10 reps;Standing;Left (AROM; R; 5 reps; Standing )    General Comments        Pertinent Vitals/Pain Pain Assessment: Faces Faces Pain Scale: Hurts a little bit Pain Location: L hip  Pain Descriptors / Indicators:  (Strain, no pain) Pain Intervention(s): Monitored during session;Repositioned     Home Living                      Prior Function            PT Goals (current goals can now be found in the care plan section) Acute Rehab PT Goals Patient Stated Goal: go home PT Goal Formulation: With patient Time For Goal Achievement: 11/17/16 Potential to Achieve Goals: Good    Frequency    7X/week      PT Plan Current plan remains appropriate    Co-evaluation              AM-PAC PT "6 Clicks" Daily Activity  Outcome Measure  Difficulty turning over in bed (including adjusting bedclothes, sheets and blankets)?: A Lot Difficulty moving from lying on back to sitting on the side of the bed? : Unable Difficulty sitting down on and standing up from a chair with arms (e.g., wheelchair, bedside commode, etc,.)?: A Little Help needed moving to and from a bed to chair (including a wheelchair)?: A Little Help needed walking in hospital room?: A Little Help needed climbing 3-5 steps with a railing? : Total 6 Click Score: 13    End of Session Equipment Utilized During Treatment: Gait belt Activity Tolerance: Patient tolerated treatment well (objective measures of pt did not correlate with pt report of no pain) Patient left: in chair;with call bell/phone within reach;with family/visitor present Nurse Communication: Mobility status PT Visit Diagnosis: Unsteadiness on feet (R26.81);Other abnormalities of gait and mobility (R26.89);Muscle weakness (generalized) (M62.81);Difficulty in walking, not elsewhere classified (R26.2);Pain Pain - Right/Left: Left Pain - part of body: Hip     Time: 3086-5784 PT Time Calculation (min) (ACUTE ONLY): 33 min  Charges:  $Gait Training: 8-22 mins $Therapeutic Exercise: 8-22 mins                    G Codes:  Functional Assessment Tool Used: AM-PAC 6 Clicks Basic Mobility    Fransisca Connors, SPTA   Fransisca Connors 11/11/2016, 2:10 PM

## 2016-11-11 NOTE — Progress Notes (Signed)
Physical Therapy Treatment Patient Details Name: Victor Ball MRN: [id number] DOB: 1940/03/06 Today's Date: 11/11/2016    History of Present Illness RAYSHAD RIVIELLO, 76 y.o. male, has a history of pain and functional disability in the left hip(s) due to arthritis and patient has failed non-surgical conservative treatments for greater than 12 weeks to include NSAID's and/or analgesics, corticosteriod injections, flexibility and strengthening excercises, supervised PT with diminished ADL's post treatment, use of assistive devices, weight reduction as appropriate and activity modification.  Onset of symptoms was gradual starting 3 years ago with gradually worsening course since that time.. [name] 11/09/16.    PT Comments    Pt ambulated a greater distance; required multiple VCs for decreased weight on UEs and posture. When pt spoke, he would stop ambulating; had difficulty ambulating and speaking at same time. Pt was able to recall and demonstrate correct sequencing pattern for ascending/descending stairs. Performed standing exercises at counter; pt apprehensive to put weight on L LE. Required cues for posture and technique.   Follow Up Recommendations  Home health PT;Supervision/Assistance - 24 hour     Equipment Recommendations  Rolling walker with 5" wheels    Recommendations for Other Services       Precautions / Restrictions Precautions Precautions: Fall Restrictions Weight Bearing Restrictions: Yes LLE Weight Bearing: Weight bearing as tolerated    Mobility  Bed Mobility               General bed mobility comments: up in chair  Transfers Overall transfer level: Needs assistance Equipment used: Rolling walker (2 wheeled) Transfers: Sit to/from Stand Sit to Stand: Min guard         General transfer comment: from recliner for safety due to pt's concern about urinating upon standing.  Ambulation/Gait Ambulation/Gait assistance: Min  guard Ambulation Distance (Feet): 290 Feet Assistive device: Rolling walker (2 wheeled) Gait Pattern/deviations: Step-to pattern;Step-through pattern;Decreased step length - right;Decreased stance time - left;Decreased stride length;Antalgic;Trunk flexed     General Gait Details: min guard for safety. VCs needed to decrease weight on UEs, use a step-through pattern, and posture.   Stairs Stairs: Yes   Stair Management: Two rails;Step to pattern;Forwards Number of Stairs: 4 General stair comments: Min guard for safety. Pt able to verbalize and demonstrate proper sequencing on steps.  Wheelchair Mobility    Modified Rankin (Stroke Patients Only)       Balance Overall balance assessment: Needs assistance Sitting-balance support: No upper extremity supported;Feet supported Sitting balance-Leahy Scale: Fair Sitting balance - Comments: vc and min assist needed for sitting posture during seated marches due to discomfort in L hip   Standing balance support: Bilateral upper extremity supported Standing balance-Leahy Scale: Fair Standing balance comment: Requires RW support for balance during mobility.                            Cognition Arousal/Alertness: Awake/alert Behavior During Therapy: WFL for tasks assessed/performed Overall Cognitive Status: Within Functional Limits for tasks assessed                                        Exercises Total Joint Exercises Hip ABduction/ADduction: Standing;AROM;Both;10 reps Knee Flexion: AROM;Both;10 reps;Standing Marching in Standing: 10 reps;AAROM;Left;5 reps;Seated (AROM; 10 reps; R; seated) Standing Hip Extension: AROM;Both;Standing;10 reps    General Comments  Pertinent Vitals/Pain Pain Assessment: 0-10 Pain Score: 3  (When placing weight on L hip) Faces Pain Scale: Hurts a little bit Pain Location: L hip  Pain Descriptors / Indicators:  (strain, not pain) Pain Intervention(s): Monitored  during session;Repositioned    Home Living                      Prior Function            PT Goals (current goals can now be found in the care plan section) Acute Rehab PT Goals Patient Stated Goal: go home PT Goal Formulation: With patient Time For Goal Achievement: 11/17/16 Potential to Achieve Goals: Good Progress towards PT goals: Progressing toward goals    Frequency    7X/week      PT Plan Current plan remains appropriate    Co-evaluation              AM-PAC PT "6 Clicks" Daily Activity  Outcome Measure  Difficulty turning over in bed (including adjusting bedclothes, sheets and blankets)?: A Lot Difficulty moving from lying on back to sitting on the side of the bed? : A Lot Difficulty sitting down on and standing up from a chair with arms (e.g., wheelchair, bedside commode, etc,.)?: A Little Help needed moving to and from a bed to chair (including a wheelchair)?: A Little Help needed walking in hospital room?: A Little Help needed climbing 3-5 steps with a railing? : A Little 6 Click Score: 16    End of Session Equipment Utilized During Treatment: Gait belt Activity Tolerance: Patient tolerated treatment well Patient left: in chair;with call bell/phone within reach;with family/visitor present Nurse Communication: Mobility status PT Visit Diagnosis: Unsteadiness on feet (R26.81);Other abnormalities of gait and mobility (R26.89);Muscle weakness (generalized) (M62.81);Difficulty in walking, not elsewhere classified (R26.2);Pain Pain - Right/Left: Left Pain - part of body: Hip     Time: 1287-8676 PT Time Calculation (min) (ACUTE ONLY): 33 min  Charges:  $Gait Training: 8-22 mins $Therapeutic Exercise: 8-22 mins                    G Codes:  Functional Assessment Tool Used: AM-PAC 6 Clicks Basic Mobility    Janna Arch, SPTA   Janna Arch 11/11/2016, 2:53 PM

## 2016-11-11 NOTE — Progress Notes (Signed)
Discharge instructions, prescriptions, and post op instructions reviewed with patient and wife. Verbalized understanding. IV removed, catheter tip intact. Dressing c/d/i. No s/s of distress. No questions/concerns at this time. Awaiting wheelchair transport to lobby for discharge.

## 2016-11-11 NOTE — Discharge Summary (Signed)
Patient ID: Victor Ball MRN: [id number] DOB/AGE: 78-Dec-1942 78 y.o.  Admit date: 11/09/2016 Discharge date: 11/11/2016  Admission Diagnoses:  Principal Problem:   Unilateral primary osteoarthritis, left hip Active Problems:   Status post total replacement of left hip   Discharge Diagnoses:  Same  Past Medical History:  Diagnosis Date  . Hepatitis C, chronic (HCC)    treated prev with Harvoni, tested and cleared last year  . History of kidney stones   . Hyperlipidemia   . Hypertension    his family physician believes it is related to pain and will reassess after surgery-on no medications    Surgeries: Procedure(s): LEFT TOTAL HIP ARTHROPLASTY ANTERIOR APPROACH on 11/09/2016   Consultants:   Discharged Condition: Improved  Hospital Course: Victor Ball is an 76 y.o. male who was admitted 11/09/2016 for operative treatment ofUnilateral primary osteoarthritis, left hip. Patient has severe unremitting pain that affects sleep, daily activities, and work/hobbies. After pre-op clearance the patient was taken to the operating room on 11/09/2016 and underwent  Procedure(s): LEFT TOTAL HIP ARTHROPLASTY ANTERIOR APPROACH.    Patient was given perioperative antibiotics: Anti-infectives    Start     Dose/Rate Route Frequency Ordered Stop   11/09/16 1845  ceFAZolin (ANCEF) IVPB 1 g/50 mL premix     1 g 100 mL/hr over 30 Minutes Intravenous Every 6 hours 11/09/16 1841 11/10/16 0644   11/09/16 1200  ceFAZolin (ANCEF) IVPB 2g/100 mL premix     2 g 200 mL/hr over 30 Minutes Intravenous To [facility] 11/08/16 1255 11/09/16 1315       Patient was given sequential compression devices, early ambulation, and chemoprophylaxis to prevent DVT.  Patient benefited maximally from hospital stay and there were no complications.    Recent vital signs: Patient Vitals for the past 24 hrs:  BP Temp Temp src Pulse Resp SpO2  11/11/16 0431 (!) 147/77 99.1 F (37.3 C) Oral 97 15 95 %   11/10/16 1957 126/68 98.7 F (37.1 C) Oral 82 17 92 %     Recent laboratory studies:  Recent Labs  11/10/16 0344  WBC 9.9  HGB 13.3  HCT 40.0  PLT 152  NA 135  K 4.1  CL 98*  CO2 27  BUN 13  CREATININE 0.88  GLUCOSE 156*  CALCIUM 8.4*     Discharge Medications:   Allergies as of 11/11/2016   No Known Allergies     Medication List    STOP taking these medications   aspirin 81 MG tablet Replaced by:  aspirin 81 MG chewable tablet     TAKE these medications   aspirin 81 MG chewable tablet Chew 1 tablet (81 mg total) by mouth 2 (two) times daily. Replaces:  aspirin 81 MG tablet   FISH OIL BURP-LESS 500 MG Caps Take 500 mg by mouth daily.   HYDROcodone-acetaminophen 5-325 MG tablet Commonly known as:  NORCO/VICODIN Take 1 tablet by mouth every 4 (four) hours as needed for moderate pain ((score 4 to 6)).   MATURE ADULT CENTURY PO Take 1 tablet by mouth daily.   methocarbamol 500 MG tablet Commonly known as:  ROBAXIN Take 1 tablet (500 mg total) by mouth every 6 (six) hours as needed for muscle spasms.   nitroGLYCERIN 0.2 mg/hr patch Commonly known as:  NITRODUR - Dosed in mg/24 hr Place 1/4 to 1/2 of a patch over affected region. Remove and replace once daily.  Slightly alter skin placement daily   TART CHERRY ADVANCED PO Take  3,000 mg by mouth daily.   Turmeric Curcumin 500 MG Caps Take 1,000 mg by mouth daily.            Durable Medical Equipment        Start     Ordered   11/09/16 1842  DME 3 n 1  Once     11/09/16 1841   11/09/16 1842  DME Walker rolling  Once    Question:  Patient needs a walker to treat with the following condition  Answer:  Status post total replacement of left hip   11/09/16 1841      Diagnostic Studies: Dg Pelvis Portable  Result Date: 11/09/2016 CLINICAL DATA:  Status post left hip replacement. EXAM: PORTABLE PELVIS 1-2 VIEWS COMPARISON:  11/09/2016 FINDINGS: AP portable supine view of the pelvis was obtained.  There is a left hip arthroplasty. Left hip arthroplasty appears located on this single view. Joint space narrowing and subchondral sclerosis in the right hip compatible with osteoarthritis. IMPRESSION: Left hip arthroplasty without complicating features. Right hip osteoarthritis. Electronically Signed   By: Markus Daft M.D.   On: 11/09/2016 15:17   Dg C-arm 61-120 Min  Result Date: 11/09/2016 CLINICAL DATA:  Post LEFT total hip arthroplasty EXAM: DG C-ARM 61-120 MIN; OPERATIVE LEFT HIP WITH PELVIS COMPARISON:  07/08/2016 Fluoroscopy time 0 minutes 46 seconds FINDINGS: Osseous demineralization. Degenerative changes of both hips on initial imaging. Components of a LEFT hip prosthesis are then identified. No acute fracture or dislocation. IMPRESSION: LEFT hip prosthesis without acute complication. Electronically Signed   By: Lavonia Dana M.D.   On: 11/09/2016 14:07   Dg Hip Operative Unilat W Or W/o Pelvis Left  Result Date: 11/09/2016 CLINICAL DATA:  Post LEFT total hip arthroplasty EXAM: DG C-ARM 61-120 MIN; OPERATIVE LEFT HIP WITH PELVIS COMPARISON:  07/08/2016 Fluoroscopy time 0 minutes 46 seconds FINDINGS: Osseous demineralization. Degenerative changes of both hips on initial imaging. Components of a LEFT hip prosthesis are then identified. No acute fracture or dislocation. IMPRESSION: LEFT hip prosthesis without acute complication. Electronically Signed   By: Lavonia Dana M.D.   On: 11/09/2016 14:07    Disposition: Final discharge disposition not confirmed    Follow-up Information    Mcarthur Rossetti, MD. Schedule an appointment as soon as possible for a visit in 2 week(s).   Specialty:  Orthopedic Surgery Contact information: [place] [address] [phone]            Signed: Erskine Emery 11/11/2016, 2:06 PM

## 2016-11-11 NOTE — Discharge Instructions (Signed)

## 2016-11-12 ENCOUNTER — Telehealth (INDEPENDENT_AMBULATORY_CARE_PROVIDER_SITE_OTHER): Payer: Self-pay | Admitting: Orthopaedic Surgery

## 2016-11-12 DIAGNOSIS — Z96643 Presence of artificial hip joint, bilateral: Secondary | ICD-10-CM | POA: Diagnosis not present

## 2016-11-12 DIAGNOSIS — Z9181 History of falling: Secondary | ICD-10-CM | POA: Diagnosis not present

## 2016-11-12 DIAGNOSIS — Z471 Aftercare following joint replacement surgery: Secondary | ICD-10-CM | POA: Diagnosis not present

## 2016-11-12 DIAGNOSIS — M47816 Spondylosis without myelopathy or radiculopathy, lumbar region: Secondary | ICD-10-CM | POA: Diagnosis not present

## 2016-11-12 DIAGNOSIS — I10 Essential (primary) hypertension: Secondary | ICD-10-CM | POA: Diagnosis not present

## 2016-11-12 NOTE — Telephone Encounter (Signed)
Kelly from [facility] called requesting verbal orders for the following:  1 week 1 3 week 1 2 week 1  CB#[id number].  Thank you.

## 2016-11-12 NOTE — Telephone Encounter (Signed)
[facility] LMVM advising ok.  

## 2016-11-12 NOTE — Telephone Encounter (Signed)
Ok for orders? 

## 2016-11-12 NOTE — Telephone Encounter (Signed)
[place] for those orders for home health

## 2016-11-15 DIAGNOSIS — Z471 Aftercare following joint replacement surgery: Secondary | ICD-10-CM | POA: Diagnosis not present

## 2016-11-15 DIAGNOSIS — Z9181 History of falling: Secondary | ICD-10-CM | POA: Diagnosis not present

## 2016-11-15 DIAGNOSIS — I10 Essential (primary) hypertension: Secondary | ICD-10-CM | POA: Diagnosis not present

## 2016-11-15 DIAGNOSIS — Z96643 Presence of artificial hip joint, bilateral: Secondary | ICD-10-CM | POA: Diagnosis not present

## 2016-11-15 DIAGNOSIS — M47816 Spondylosis without myelopathy or radiculopathy, lumbar region: Secondary | ICD-10-CM | POA: Diagnosis not present

## 2016-11-17 DIAGNOSIS — I10 Essential (primary) hypertension: Secondary | ICD-10-CM | POA: Diagnosis not present

## 2016-11-17 DIAGNOSIS — Z9181 History of falling: Secondary | ICD-10-CM | POA: Diagnosis not present

## 2016-11-17 DIAGNOSIS — M47816 Spondylosis without myelopathy or radiculopathy, lumbar region: Secondary | ICD-10-CM | POA: Diagnosis not present

## 2016-11-17 DIAGNOSIS — Z471 Aftercare following joint replacement surgery: Secondary | ICD-10-CM | POA: Diagnosis not present

## 2016-11-17 DIAGNOSIS — Z96643 Presence of artificial hip joint, bilateral: Secondary | ICD-10-CM | POA: Diagnosis not present

## 2016-11-19 DIAGNOSIS — Z471 Aftercare following joint replacement surgery: Secondary | ICD-10-CM | POA: Diagnosis not present

## 2016-11-19 DIAGNOSIS — M47816 Spondylosis without myelopathy or radiculopathy, lumbar region: Secondary | ICD-10-CM | POA: Diagnosis not present

## 2016-11-19 DIAGNOSIS — Z96643 Presence of artificial hip joint, bilateral: Secondary | ICD-10-CM | POA: Diagnosis not present

## 2016-11-19 DIAGNOSIS — I10 Essential (primary) hypertension: Secondary | ICD-10-CM | POA: Diagnosis not present

## 2016-11-19 DIAGNOSIS — Z9181 History of falling: Secondary | ICD-10-CM | POA: Diagnosis not present

## 2016-11-23 ENCOUNTER — Encounter (INDEPENDENT_AMBULATORY_CARE_PROVIDER_SITE_OTHER): Payer: Self-pay | Admitting: Physician Assistant

## 2016-11-23 ENCOUNTER — Ambulatory Visit (INDEPENDENT_AMBULATORY_CARE_PROVIDER_SITE_OTHER): Payer: Medicare Other | Admitting: Physician Assistant

## 2016-11-23 DIAGNOSIS — Z9181 History of falling: Secondary | ICD-10-CM | POA: Diagnosis not present

## 2016-11-23 DIAGNOSIS — I10 Essential (primary) hypertension: Secondary | ICD-10-CM | POA: Diagnosis not present

## 2016-11-23 DIAGNOSIS — Z96643 Presence of artificial hip joint, bilateral: Secondary | ICD-10-CM | POA: Diagnosis not present

## 2016-11-23 DIAGNOSIS — Z471 Aftercare following joint replacement surgery: Secondary | ICD-10-CM | POA: Diagnosis not present

## 2016-11-23 DIAGNOSIS — Z96642 Presence of left artificial hip joint: Secondary | ICD-10-CM

## 2016-11-23 DIAGNOSIS — M47816 Spondylosis without myelopathy or radiculopathy, lumbar region: Secondary | ICD-10-CM | POA: Diagnosis not present

## 2016-11-23 MED ORDER — MUPIROCIN 2 % EX OINT: TOPICAL_OINTMENT | CUTANEOUS | 1 refills | Status: DC

## 2016-11-23 NOTE — Progress Notes (Signed)
Victor Ball returns today follow-up of his left total hip arthroplasty.  He is overall doing well.  He has had some swelling of his leg.  He did get the proximal end of his incision wet.  He was brought on a 1 mg aspirin twice daily.  No shortness of breath fevers chills chest pain.  Left hip: Good range of motion of the hip without significant pain.  Proximal incision was slight maceration.  No expressible purulence.  Positive seroma.  This is aspirated after prep with ethyl chloride and Betadine total of 100 cc of serous pain in his fluid obtained.  Patient tolerates well.  Impression: Status post left total hip arthroplasty  Plan: He will wash the wound with antibacterial soap daily.  Then apply a small amount of mupirocin to the proximal wound.  We will see him back in a week to check the wound.

## 2016-11-24 ENCOUNTER — Encounter (INDEPENDENT_AMBULATORY_CARE_PROVIDER_SITE_OTHER): Payer: Self-pay | Admitting: Physician Assistant

## 2016-11-25 DIAGNOSIS — Z9181 History of falling: Secondary | ICD-10-CM | POA: Diagnosis not present

## 2016-11-25 DIAGNOSIS — Z96643 Presence of artificial hip joint, bilateral: Secondary | ICD-10-CM | POA: Diagnosis not present

## 2016-11-25 DIAGNOSIS — I10 Essential (primary) hypertension: Secondary | ICD-10-CM | POA: Diagnosis not present

## 2016-11-25 DIAGNOSIS — M47816 Spondylosis without myelopathy or radiculopathy, lumbar region: Secondary | ICD-10-CM | POA: Diagnosis not present

## 2016-11-25 DIAGNOSIS — Z471 Aftercare following joint replacement surgery: Secondary | ICD-10-CM | POA: Diagnosis not present

## 2016-12-01 ENCOUNTER — Encounter (INDEPENDENT_AMBULATORY_CARE_PROVIDER_SITE_OTHER): Payer: Self-pay | Admitting: Orthopaedic Surgery

## 2016-12-01 ENCOUNTER — Ambulatory Visit (INDEPENDENT_AMBULATORY_CARE_PROVIDER_SITE_OTHER): Payer: Medicare Other | Admitting: Orthopaedic Surgery

## 2016-12-01 DIAGNOSIS — Z96642 Presence of left artificial hip joint: Secondary | ICD-10-CM

## 2016-12-01 MED ORDER — DOXYCYCLINE HYCLATE 100 MG PO TABS: 100.0000 mg | ORAL_TABLET | Freq: Two times a day (BID) | ORAL | 0 refills | Status: DC

## 2016-12-01 NOTE — Progress Notes (Signed)
The patient is now 3 weeks status post a left total hip arthroplasty directed approach.  We will see him back today for wound check.  He is doing well otherwise.  On exam he does have still a seroma.  I was able to aspirate about 50 cc of fluid from this and gave him good pain relief.  The incision itself looks great except for some small dehiscence at the superior aspect which we have already none of them are following.  He is placing Bactroban ointment on this daily.  It does look like it is trying to heal.  I did unroofed some scar tissue from this.  I will have him come back in 2 weeks for continued wound check and follow-up.  I will send in some doxycycline for him as well.  He has not had any pain with his hip from otherwise ambulating exam today without assistive device and doing well.

## 2016-12-14 ENCOUNTER — Ambulatory Visit (INDEPENDENT_AMBULATORY_CARE_PROVIDER_SITE_OTHER): Payer: Medicare Other | Admitting: Orthopaedic Surgery

## 2016-12-14 ENCOUNTER — Encounter (INDEPENDENT_AMBULATORY_CARE_PROVIDER_SITE_OTHER): Payer: Self-pay | Admitting: Orthopaedic Surgery

## 2016-12-14 DIAGNOSIS — Z96642 Presence of left artificial hip joint: Secondary | ICD-10-CM

## 2016-12-14 NOTE — Progress Notes (Signed)
The patient is mainly coming in today for a wound check.  He is 35 days status post a left total hip arthroplasty through direct anterior approach.  He has slight small wound dehiscence at the very superior aspect of the incision.  He has been placing Bactroban ointment on this daily and doing well.  On exam the area is closing over nicely and she is a small area with no evidence of infection.  We will continue to put Bactroban ointment on this daily until it is healed but overall is looking good overall.  He is walking without assistive device.  Also puddings at the range of motion.  There is no evidence of infection.  We will see him back in a month as he was doing overall but no x-rays are needed.  Obviously if he has any problems before then he will let us know.

## 2017-01-13 ENCOUNTER — Encounter (INDEPENDENT_AMBULATORY_CARE_PROVIDER_SITE_OTHER): Payer: Self-pay | Admitting: Orthopaedic Surgery

## 2017-01-13 ENCOUNTER — Ambulatory Visit (INDEPENDENT_AMBULATORY_CARE_PROVIDER_SITE_OTHER): Payer: Medicare Other | Admitting: Orthopaedic Surgery

## 2017-01-13 DIAGNOSIS — Z96642 Presence of left artificial hip joint: Secondary | ICD-10-CM

## 2017-01-13 NOTE — Progress Notes (Signed)
The patient is now between 8 and 9 weeks status post a left total hip arthroplasty.  He says he is doing great and has no issues at all.  On examination his incision is healed over nicely.  His ligaments are equal.  He tolerates me easily putting  his hip through range of motion.  He is 76 years old.  He is walking without assistive device.  At this point all questions concerns were answered and addressed.  We can see him back in 6 months.  At that visit I would like a low AP pelvis and lateral of his left operative hip.

## 2017-02-14 ENCOUNTER — Other Ambulatory Visit: Payer: Self-pay | Admitting: Family Medicine

## 2017-02-14 DIAGNOSIS — R739 Hyperglycemia, unspecified: Secondary | ICD-10-CM

## 2017-02-18 ENCOUNTER — Ambulatory Visit (INDEPENDENT_AMBULATORY_CARE_PROVIDER_SITE_OTHER): Payer: Medicare Other

## 2017-02-18 VITALS — BP 148/80 | HR 62 | Temp 97.8°F | Ht 73.5 in | Wt 234.0 lb

## 2017-02-18 DIAGNOSIS — Z0000 Encounter for general adult medical examination without abnormal findings: Secondary | ICD-10-CM | POA: Diagnosis not present

## 2017-02-18 DIAGNOSIS — R739 Hyperglycemia, unspecified: Secondary | ICD-10-CM

## 2017-02-18 LAB — LIPID PANEL
CHOLESTEROL: 154 mg/dL (ref 0–200)
HDL: 44.4 mg/dL (ref >39.00)
LDL CALC: 84 mg/dL (ref 0–99)
NONHDL: 109.83
Total CHOL/HDL Ratio: 3
Triglycerides: 130 mg/dL (ref 0.0–149.0)
VLDL: 26 mg/dL (ref 0.0–40.0)

## 2017-02-18 LAB — COMPREHENSIVE METABOLIC PANEL
ALBUMIN: 4.1 g/dL (ref 3.5–5.2)
ALT: 16 U/L (ref 0–53)
AST: 17 U/L (ref 0–37)
Alkaline Phosphatase: 120 U/L — ABNORMAL HIGH (ref 39–117)
BUN: 17 mg/dL (ref 6–23)
CHLORIDE: 106 meq/L (ref 96–112)
CO2: 30 mEq/L (ref 19–32)
CREATININE: 1.03 mg/dL (ref 0.40–1.50)
Calcium: 8.9 mg/dL (ref 8.4–10.5)
GFR: 74.03 mL/min (ref >60.00)
Glucose, Bld: 121 mg/dL — ABNORMAL HIGH (ref 70–99)
POTASSIUM: 4.9 meq/L (ref 3.5–5.1)
SODIUM: 142 meq/L (ref 135–145)
TOTAL PROTEIN: 7 g/dL (ref 6.0–8.3)
Total Bilirubin: 0.5 mg/dL (ref 0.2–1.2)

## 2017-02-18 NOTE — Progress Notes (Signed)
Pre visit review using our clinic review tool, if applicable. No additional management support is needed unless otherwise documented below in the visit note. 

## 2017-02-18 NOTE — Patient Instructions (Signed)
[name] , Thank you for taking time to come for your Medicare Wellness Visit. I appreciate your ongoing commitment to your health goals. Please review the following plan we discussed and let me know if I can assist you in the future.   These are the goals we discussed: Goals    . Increase physical activity     Starting 02/18/2017, I will continue to do Silver Sneakers for 45 minutes 5 days per week, to play tennis for 2 hours per week, and to play golf as weather permits.        This is a list of the screening recommended for you and due dates:  Health Maintenance  Topic Date Due  . Tetanus Vaccine  10/25/2022  . Flu Shot  Completed  . Pneumonia vaccines  Completed   Preventive Care for Adults  A healthy lifestyle and preventive care can promote health and wellness. Preventive health guidelines for adults include the following key practices.  . A routine yearly physical is a good way to check with your health care provider about your health and preventive screening. It is a chance to share any concerns and updates on your health and to receive a thorough exam.  . Visit your dentist for a routine exam and preventive care every 6 months. Brush your teeth twice a day and floss once a day. Good oral hygiene prevents tooth decay and gum disease.  . The frequency of eye exams is based on your age, health, family medical history, use  of contact lenses, and other factors. Follow your health care provider's recommendations for frequency of eye exams.  . Eat a healthy diet. Foods like vegetables, fruits, whole grains, low-fat dairy products, and lean protein foods contain the nutrients you need without too many calories. Decrease your intake of foods high in solid fats, added sugars, and salt. Eat the right amount of calories for you. Get information about a proper diet from your health care provider, if necessary.  . Regular physical exercise is one of the most important things you can do for  your health. Most adults should get at least 150 minutes of moderate-intensity exercise (any activity that increases your heart rate and causes you to sweat) each week. In addition, most adults need muscle-strengthening exercises on 2 or more days a week.  Silver Sneakers may be a benefit available to you. To determine eligibility, you may visit the website: www.silversneakers.com or contact program at (435)375-4112 Mon-Fri between 8AM-8PM.   . Maintain a healthy weight. The body mass index (BMI) is a screening tool to identify possible weight problems. It provides an estimate of body fat based on height and weight. Your health care provider can find your BMI and can help you achieve or maintain a healthy weight.   For adults 20 years and older: ? A BMI below 18.5 is considered underweight. ? A BMI of 18.5 to 24.9 is normal. ? A BMI of 25 to 29.9 is considered overweight. ? A BMI of 30 and above is considered obese.   . Maintain normal blood lipids and cholesterol levels by exercising and minimizing your intake of saturated fat. Eat a balanced diet with plenty of fruit and vegetables. Blood tests for lipids and cholesterol should begin at age 39 and be repeated every 5 years. If your lipid or cholesterol levels are high, you are over 50, or you are at high risk for heart disease, you may need your cholesterol levels checked more frequently. Ongoing high  lipid and cholesterol levels should be treated with medicines if diet and exercise are not working.  . If you smoke, find out from your health care provider how to quit. If you do not use tobacco, please do not start.  . If you choose to drink alcohol, please do not consume more than 2 drinks per day. One drink is considered to be 12 ounces (355 mL) of beer, 5 ounces (148 mL) of wine, or 1.5 ounces (44 mL) of liquor.  . If you are 68-26 years old, ask your health care provider if you should take aspirin to prevent strokes.  . Use sunscreen.  Apply sunscreen liberally and repeatedly throughout the day. You should seek shade when your shadow is shorter than you. Protect yourself by wearing long sleeves, pants, a wide-brimmed hat, and sunglasses year round, whenever you are outdoors.  . Once a month, do a whole body skin exam, using a mirror to look at the skin on your back. Tell your health care provider of new moles, moles that have irregular borders, moles that are larger than a pencil eraser, or moles that have changed in shape or color.

## 2017-02-18 NOTE — Progress Notes (Signed)
PCP notes:   Health maintenance:  No gaps identified.  Abnormal screenings:   Hearing - failed  Hearing Screening   125Hz  250Hz  500Hz  1000Hz  2000Hz  3000Hz  4000Hz  6000Hz  8000Hz   Right ear:   40 40 40  0    Left ear:   40 40 40  0     Patient concerns:   None  Nurse concerns:  None  Next PCP appt:   02/22/17 @ 0845  I reviewed health advisor's note, was available for consultation on the day of service listed in this note, and agree with documentation and plan. Elsie Stain, MD.

## 2017-02-18 NOTE — Progress Notes (Signed)
Subjective:   Victor Ball is a 77 y.o. male who presents for Medicare Annual (Subsequent) preventive examination.  Review of Systems:  N/A Cardiac Risk Factors include: advanced age (>39men, >32 women);male gender;obesity (BMI >30kg/m2);dyslipidemia;hypertension     Objective:     Vitals: BP (!) 148/80 (BP Location: Right Arm, Patient Position: Sitting, Cuff Size: Normal)   Pulse 62   Temp 97.8 F (36.6 C) (Oral)   Ht 6' 1.5" (1.867 m) Comment: no shoes  Wt 234 lb (106.1 kg)   SpO2 95%   BMI 30.45 kg/m   Body mass index is 30.45 kg/m.  Advanced Directives 02/18/2017 11/10/2016 10/29/2016 02/17/2016  Does Patient Have a Medical Advance Directive? Yes Yes Yes Yes  Type of Paramedic of [place];Living will [place];Living will [place];Living will [place];Living will  Does patient want to make changes to medical advance directive? - No - Patient declined No - Patient declined -  Copy of [place] in Chart? Yes Yes Yes Yes    Tobacco Social History   Tobacco Use  Smoking Status Former Smoker  . Last attempt to quit: 01/18/1973  . Years since quitting: 44.1  Smokeless Tobacco Never Used     Counseling given: No   Clinical Intake:  Pre-visit preparation completed: Yes  Pain : No/denies pain Pain Score: 0-No pain     Nutritional Status: BMI > 30  Obese Nutritional Risks: None Diabetes: No  How often do you need to have someone help you when you read instructions, pamphlets, or other written materials from your doctor or pharmacy?: 1 - Never What is the last grade level you completed in school?: 12th grade + 2 yrs college  Interpreter Needed?: No  Comments: pt lives with spouse Information entered by :: LPinson, LPN  Past Medical History:  Diagnosis Date  . Hepatitis C, chronic (HCC)    treated prev with Harvoni, tested and cleared last year  . History  of kidney stones   . Hyperlipidemia   . Hypertension    his family physician believes it is related to pain and will reassess after surgery-on no medications   Past Surgical History:  Procedure Laterality Date  . APPENDECTOMY  1948  . JOINT REPLACEMENT    . LIVER BIOPSY    . PROSTATE BIOPSY     neg, x2  . TOE FUSION  1985   left great toe  . TONSILLECTOMY AND ADENOIDECTOMY  1945  . TOTAL HIP ARTHROPLASTY Left 11/09/2016   Procedure: LEFT TOTAL HIP ARTHROPLASTY ANTERIOR APPROACH;  Surgeon: Mcarthur Rossetti, MD;  Location: [facility];  Service: Orthopedics;  Laterality: Left;  Marland Kitchen VASECTOMY     Family History  Problem Relation Age of Onset  . Heart disease Father   . Diabetes Father   . Celiac disease Sister   . Stroke Brother   . Heart disease Brother        MI then stents  . Stroke Brother   . Cancer Paternal Aunt   . Celiac disease Brother   . Heart disease Brother   . Prostate cancer Neg Hx   . Colon cancer Neg Hx    Social History   Socioeconomic History  . Marital status: Married    Spouse name: None  . Number of children: 2  . Years of education: None  . Highest education level: None  Social Needs  . Financial resource strain: None  .  Food insecurity - worry: None  . Food insecurity - inability: None  . Transportation needs - medical: None  . Transportation needs - non-medical: None  Occupational History  . Occupation: Insurance agent---retired    Comment: [employer] and worker's comp  Tobacco Use  . Smoking status: Former Smoker    Last attempt to quit: 01/18/1973    Years since quitting: 44.1  . Smokeless tobacco: Never Used  Substance and Sexual Activity  . Alcohol use: Yes    Alcohol/week: 4.2 oz    Types: 4 Glasses of wine, 3 Standard drinks or equivalent per week    Comment: 1 drink a day - either wine or cocktail  . Drug use: No  . Sexual activity: Yes  Other Topics Concern  . None  Social History Narrative   2 daughters   Married  1961      Has living will   Wife, then daughters equally if wife were incapacitated, has health care POA.   Would accept resuscitation attempts   Not sure about tube feeds    Outpatient Encounter Medications as of 02/18/2017  Medication Sig  . aspirin 81 MG tablet Take by mouth.  . Misc Natural Products (TART CHERRY ADVANCED PO) Take 3,000 mg by mouth daily.  . Multiple Vitamins-Minerals (MATURE ADULT CENTURY PO) Take 1 tablet by mouth daily.   . Omega-3 Fatty Acids (FISH OIL BURP-LESS) 500 MG CAPS Take 500 mg by mouth daily.  Marland Kitchen PUMPKIN SEED PO Take 262 mg by mouth daily.  . Turmeric Curcumin 500 MG CAPS Take 1,000 mg by mouth daily.  . [DISCONTINUED] aspirin 81 MG chewable tablet Chew 1 tablet (81 mg total) by mouth 2 (two) times daily.  . [DISCONTINUED] doxycycline (VIBRA-TABS) 100 MG tablet Take 1 tablet (100 mg total) 2 (two) times daily by mouth.  . [DISCONTINUED] HYDROcodone-acetaminophen (NORCO/VICODIN) 5-325 MG tablet Take 1 tablet by mouth every 4 (four) hours as needed for moderate pain ((score 4 to 6)).  . [DISCONTINUED] methocarbamol (ROBAXIN) 500 MG tablet Take 1 tablet (500 mg total) by mouth every 6 (six) hours as needed for muscle spasms.  . [DISCONTINUED] mupirocin ointment (BACTROBAN) 2 % Apply ointment to affected area twice a day  . [DISCONTINUED] nitroGLYCERIN (NITRODUR - DOSED IN MG/24 HR) 0.2 mg/hr patch Place 1/4 to 1/2 of a patch over affected region. Remove and replace once daily.  Slightly alter skin placement daily (Patient not taking: Reported on 10/18/2016)   No facility-administered encounter medications on file as of 02/18/2017.     Activities of Daily Living In your present state of health, do you have any difficulty performing the following activities: 02/18/2017 11/10/2016  Hearing? N -  Vision? N -  Difficulty concentrating or making decisions? N -  Walking or climbing stairs? N -  Comment - -  Dressing or bathing? N -  Doing errands, shopping? N N    Preparing Food and eating ? N -  Using the Toilet? N -  In the past six months, have you accidently leaked urine? N -  Do you have problems with loss of bowel control? N -  Managing your Medications? N -  Managing your Finances? N -  Housekeeping or managing your Housekeeping? N -  Some recent data might be hidden    Patient Care Team: Tonia Ghent, MD as PCP - General (Family Medicine) Mcarthur Rossetti, MD as Consulting Physician (Orthopedic Surgery) Eugenie Birks Paschal Dopp., MD as Referring Physician (Dentistry) Ralene Bathe,  MD as Referring Physician (Dermatology)    Assessment:   This is a routine wellness examination for [place].   Hearing Screening   125Hz  250Hz  500Hz  1000Hz  2000Hz  3000Hz  4000Hz  6000Hz  8000Hz   Right ear:   40 40 40  0    Left ear:   40 40 40  0      Visual Acuity Screening   Right eye Left eye Both eyes  Without correction:     With correction: 20/20 20/40 20/20      Exercise Activities and Dietary recommendations Current Exercise Habits: Structured exercise class;Home exercise routine, Type of exercise: strength training/weights;calisthenics;stretching;treadmill;Other - see comments(golf, tennis), Time (Minutes): 60, Frequency (Times/Week): 7, Weekly Exercise (Minutes/Week): 420, Intensity: Moderate, Exercise limited by: None identified  Goals    . Increase physical activity     Starting 02/18/2017, I will continue to do Silver Sneakers for 45 minutes 5 days per week, to play tennis for 2 hours per week, and to play golf as weather permits.        Fall Risk Fall Risk  02/18/2017 02/17/2016 02/17/2015 01/04/2014 10/24/2012  Falls in the past year? No No No No No   Depression Screen PHQ 2/9 Scores 02/18/2017 02/17/2016 02/17/2015 01/04/2014  PHQ - 2 Score 0 0 0 0  PHQ- 9 Score 0 - - -     Cognitive Function MMSE - Mini Mental State Exam 02/18/2017 02/17/2016  Orientation to time 5 5  Orientation to Place 5 5  Registration 3 3  Attention/  Calculation 0 0  Recall 3 3  Language- name 2 objects 0 0  Language- repeat 1 1  Language- follow 3 step command 3 3  Language- read & follow direction 0 0  Write a sentence 0 0  Copy design 0 0  Total score 20 20     PLEASE NOTE: A Mini-Cog screen was completed. Maximum score is 20. A value of 0 denotes this part of Folstein MMSE was not completed or the patient failed this part of the Mini-Cog screening.   Mini-Cog Screening Orientation to Time - Max 5 pts Orientation to Place - Max 5 pts Registration - Max 3 pts Recall - Max 3 pts Language Repeat - Max 1 pts Language Follow 3 Step Command - Max 3 pts     Immunization History  Administered Date(s) Administered  . Influenza Split 10/06/2011  . Influenza,inj,Quad PF,6+ Mos 10/04/2012, 12/28/2013, 12/11/2014, 12/10/2015, 10/11/2016  . Pneumococcal Conjugate-13 08/15/2014  . Pneumococcal Polysaccharide-23 01/19/2003, 06/16/2012  . Td 10/24/2012  . Tdap 01/19/2003  . Zoster 01/19/2007    Screening Tests Health Maintenance  Topic Date Due  . TETANUS/TDAP  10/25/2022  . INFLUENZA VACCINE  Completed  . PNA vac Low Risk Adult  Completed      Plan:     I have personally reviewed, addressed, and noted the following in the patient's chart:  A. Medical and social history B. Use of alcohol, tobacco or illicit drugs  C. Current medications and supplements D. Functional ability and status E.  Nutritional status F.  Physical activity G. Advance directives H. List of other physicians I.  Hospitalizations, surgeries, and ER visits in previous 12 months J.  [place] to include hearing, vision, cognitive, depression L. Referrals and appointments - none  In addition, I have reviewed and discussed with patient certain preventive protocols, quality metrics, and best practice recommendations. A written personalized care plan for preventive services as well as general preventive health recommendations were provided to  patient.  See attached scanned questionnaire for additional information.   Signed,   Lindell Noe, MHA, BS, LPN Health Coach   Lindell Noe, [place]  02/25/784

## 2017-02-21 DIAGNOSIS — H5203 Hypermetropia, bilateral: Secondary | ICD-10-CM | POA: Diagnosis not present

## 2017-02-21 DIAGNOSIS — H2513 Age-related nuclear cataract, bilateral: Secondary | ICD-10-CM | POA: Diagnosis not present

## 2017-02-22 ENCOUNTER — Encounter: Payer: Self-pay | Admitting: Family Medicine

## 2017-02-22 ENCOUNTER — Ambulatory Visit (INDEPENDENT_AMBULATORY_CARE_PROVIDER_SITE_OTHER): Payer: Medicare Other | Admitting: Family Medicine

## 2017-02-22 VITALS — BP 150/90 | HR 62 | Temp 97.8°F | Ht 73.5 in | Wt 234.0 lb

## 2017-02-22 DIAGNOSIS — R945 Abnormal results of liver function studies: Secondary | ICD-10-CM | POA: Diagnosis not present

## 2017-02-22 DIAGNOSIS — R030 Elevated blood-pressure reading, without diagnosis of hypertension: Secondary | ICD-10-CM

## 2017-02-22 DIAGNOSIS — R739 Hyperglycemia, unspecified: Secondary | ICD-10-CM | POA: Diagnosis not present

## 2017-02-22 DIAGNOSIS — Z7189 Other specified counseling: Secondary | ICD-10-CM

## 2017-02-22 DIAGNOSIS — R7989 Other specified abnormal findings of blood chemistry: Secondary | ICD-10-CM

## 2017-02-22 LAB — HEMOGLOBIN A1C: HEMOGLOBIN A1C: 6.2 % (ref 4.6–6.5)

## 2017-02-22 LAB — GAMMA GT: GGT: 23 U/L (ref 7–51)

## 2017-02-22 NOTE — Assessment & Plan Note (Signed)
Advance directive- d/w pt.  If patient is incapacitated, would have wife then daughter Carmelina Dane.

## 2017-02-22 NOTE — Assessment & Plan Note (Signed)
S/p hip replacement- unclear if alk phos elevation is from bony source.  No abd pain, no jaundice.  No FCNAVD.  No RUQ pain.  Check fractionated alk phos, ggt.  H/o hcv, treated, I don't suspect HCV as the issue.  We can w/u if alk phos elevation persists.  He agrees.  D/w pt.

## 2017-02-22 NOTE — Patient Instructions (Addendum)
Go to the lab on the way out.  We'll contact you with your lab report. Check your pressure at home and update me about the readings.   Take care.  Glad to see you.  We'll go from there.   Use the eat right diet.

## 2017-02-22 NOTE — Assessment & Plan Note (Signed)
He'll update me about his BP at home and we'll go from there. Awaiting update from patient.

## 2017-02-22 NOTE — Progress Notes (Signed)
Hearing - failed.  D/w pt.  Declined hearing aids.   He went to eye clinic yesterday and is getting new glasses.   Advance directive- d/w pt.  If patient is incapacitated, would have wife then daughter Carmelina Dane.  Prostate cancer screening and PSA options (with potential risks and benefits of testing vs not testing) were discussed along with recent recs/guidelines.  He declined testing PSA at this point. Diet and exercise d/w pt.  He is back to being more active.    He had his hip replaced per ortho and is doing well.  He is back to hiking and playing tennis.  He is going to silver sneakers.    His wife was dx'd with cancer and had to have a gastrectomy but she is doing better in the meantime, able to maintain her weight and is not using a feeding tube now.    BP elevation.  D/w pt.  No CP, SOB, BLE edema.  Not on BP meds.  I asked him to update me about his BP at home.  It tends to run higher at home than here at the clinic per his report.  He thought his cuff was accurate. We talked about gradual weight loss with diet and exercise.    Alk phos elevation.  S/p hip replacement.  No abd pain, no jaundice.  No FCNAVD.  No RUQ pain.    Hyperglycemia.  D/w pt about labs and goal for diet and exercise.  A1c pending.  Low carb diet handout given/discussed.   PMH and SH reviewed  ROS: Per HPI unless specifically indicated in ROS section   Meds, vitals, and allergies reviewed.   GEN: nad, alert and oriented HEENT: mucous membranes moist NECK: supple w/o LA CV: rrr PULM: ctab, no inc wob ABD: soft, +bs, not ttp, no RUQ tenderness.   EXT: no edema SKIN: no acute rash

## 2017-02-22 NOTE — Assessment & Plan Note (Signed)
D/w pt about labs and goal for diet and exercise.  A1c pending.  Low carb diet handout given/discussed.  >25 minutes spent in face to face time with patient, >50% spent in counselling or coordination of care, discussing LFTs, sugar, BP, diet, exercise.

## 2017-02-25 LAB — ALKALINE PHOSPHATASE ISOENZYMES
Alkaline phosphatase (APISO): 136 U/L — ABNORMAL HIGH (ref 40–115)
Bone Isoenzymes: 76 % — ABNORMAL HIGH (ref 28–66)
INTESTINAL ISOENZYMES (ALP ISO): 2 % (ref 1–24)
Liver Isoenzymes: 22 % — ABNORMAL LOW (ref 25–69)

## 2017-02-28 ENCOUNTER — Other Ambulatory Visit: Payer: Self-pay | Admitting: Family Medicine

## 2017-02-28 DIAGNOSIS — R748 Abnormal levels of other serum enzymes: Secondary | ICD-10-CM

## 2017-04-22 ENCOUNTER — Other Ambulatory Visit (INDEPENDENT_AMBULATORY_CARE_PROVIDER_SITE_OTHER): Payer: Medicare Other

## 2017-04-22 DIAGNOSIS — R748 Abnormal levels of other serum enzymes: Secondary | ICD-10-CM | POA: Diagnosis not present

## 2017-04-22 LAB — HEPATIC FUNCTION PANEL
ALBUMIN: 4.1 g/dL (ref 3.5–5.2)
ALK PHOS: 99 U/L (ref 39–117)
ALT: 14 U/L (ref 0–53)
AST: 18 U/L (ref 0–37)
Bilirubin, Direct: 0.2 mg/dL (ref 0.0–0.3)
TOTAL PROTEIN: 7 g/dL (ref 6.0–8.3)
Total Bilirubin: 0.8 mg/dL (ref 0.2–1.2)

## 2017-04-25 ENCOUNTER — Other Ambulatory Visit: Payer: Medicare Other

## 2017-07-13 ENCOUNTER — Ambulatory Visit (INDEPENDENT_AMBULATORY_CARE_PROVIDER_SITE_OTHER): Payer: Self-pay

## 2017-07-13 ENCOUNTER — Encounter (INDEPENDENT_AMBULATORY_CARE_PROVIDER_SITE_OTHER): Payer: Self-pay | Admitting: Orthopaedic Surgery

## 2017-07-13 ENCOUNTER — Ambulatory Visit (INDEPENDENT_AMBULATORY_CARE_PROVIDER_SITE_OTHER): Payer: Medicare Other | Admitting: Orthopaedic Surgery

## 2017-07-13 DIAGNOSIS — M1611 Unilateral primary osteoarthritis, right hip: Secondary | ICD-10-CM | POA: Insufficient documentation

## 2017-07-13 DIAGNOSIS — Z96642 Presence of left artificial hip joint: Secondary | ICD-10-CM

## 2017-07-13 NOTE — Progress Notes (Signed)
Office Visit Note   Patient: Victor Ball           Date of Birth: February 13, 1940           MRN: [id number] Visit Date: 07/13/2017              Requested by: Tonia Ghent, MD [address] [place], [place] [address] PCP: Tonia Ghent, MD   Assessment & Plan: Visit Diagnoses:  1. History of left hip replacement   2. Unilateral primary osteoarthritis, right hip     Plan: I agree with setting up for surgery in August for a right total hip arthroplasty direct anterior approach.  He understands fully the risk and benefits of the surgery and what his intraoperative and postoperative course involved given his success of his left hip.  I do feel that it is ready given the arthritic changes on x-ray and given his physical exam.  All question concerns were answered and addressed.  We will work on getting this scheduled for 2 months from now.  Follow-Up Instructions: Return for 2 weeks post-op.   Orders:  Orders Placed This Encounter  Procedures  . XR HIP UNILAT W OR W/O PELVIS 1V LEFT   No orders of the defined types were placed in this encounter.     Procedures: No procedures performed   Clinical Data: No additional findings.   Subjective: Chief Complaint  Patient presents with  . Left Hip - Follow-up  The patient is well-known to me.  He is 8 months status post a left total hip arthroplasty direct anterior approach.  He has known severe end-stage arthritis in his right hip.  He says his left hip is done so well for him.  His right hip pain is become daily and is now starting detrimentally affect is active daily living, quality of life, his mobility.  He has well-documented severe arthritis in the right hip.  At this point given the success of his left total hip arthroplasty and given the pain he is having in his right hip with known arthritis, he would like to have a right total hip arthroplasty performed in August of this year.  He said no other changes in medical status  is very active 77 year old gentleman who exercises daily and is very active and playing golf and tennis as well as hiking.  HPI  Review of Systems He currently denies any headache, chest pain, shortness of breath, fever, chills, nausea, vomiting.  Objective: Vital Signs: There were no vitals taken for this visit.  Physical Exam He is alert and oriented x3 and in no acute distress Ortho Exam Examination of his left hip shows excellent range of motion is almost full.  Examination of his right hip shows stiffness with internal or external rotation and pain in the groin with internal and external rotation. Specialty Comments:  No specialty comments available.  Imaging: Xr Hip Unilat W Or W/o Pelvis 1v Left  Result Date: 07/13/2017 An AP pelvis and lateral of the left and right hips shows a total hip arthroplasty with no complicating feature of the left side.  The right hip has severe end-stage arthritis with evidence of femoral acetabular impingement.  There is complete loss of the superior lateral joint space.  There is sclerotic changes in particular osteophytes around the entire hip joint.    PMFS History: Patient Active Problem List   Diagnosis Date Noted  . Unilateral primary osteoarthritis, right hip 07/13/2017  . LFT elevation  02/22/2017  . Status post total replacement of left hip 11/09/2016  . Pain of left hip joint 10/18/2016  . Unilateral primary osteoarthritis, left hip 10/18/2016  . Lumbar spondylosis 07/19/2016  . Healthcare maintenance 02/19/2016  . ED (erectile dysfunction) 02/19/2016  . Rash 02/19/2016  . Hyperglycemia 02/19/2016  . Advance care planning 01/06/2014  . Osteoarthritis, hip, bilateral 10/31/2012  . Medicare annual wellness visit, subsequent 10/24/2012  . Left hip pain 10/04/2012  . Elevated PSA 10/06/2011  . Elevated BP without diagnosis of hypertension   . Hyperlipidemia   . Kidney stone   . Hepatitis C, chronic (HCC)    Past Medical History:   Diagnosis Date  . Hepatitis C, chronic ([place])    treated prev with Harvoni, tested and cleared  . History of kidney stones   . Hyperlipidemia   . Hypertension    his family physician believes it is related to pain and will reassess after surgery-on no medications    Family History  Problem Relation Age of Onset  . Heart disease Father   . Diabetes Father   . Celiac disease Sister   . Stroke Brother   . Heart disease Brother        MI then stents  . Stroke Brother   . Cancer Paternal Aunt   . Celiac disease Brother   . Heart disease Brother   . Cancer Brother   . Diabetes Brother   . Prostate cancer Neg Hx   . Colon cancer Neg Hx     Past Surgical History:  Procedure Laterality Date  . APPENDECTOMY  1948  . JOINT REPLACEMENT    . LIVER BIOPSY    . PROSTATE BIOPSY     neg, x2  . TOE FUSION  1985   left great toe  . TONSILLECTOMY AND ADENOIDECTOMY  1945  . TOTAL HIP ARTHROPLASTY Left 11/09/2016   Procedure: LEFT TOTAL HIP ARTHROPLASTY ANTERIOR APPROACH;  Surgeon: Mcarthur Rossetti, MD;  Location: [facility];  Service: Orthopedics;  Laterality: Left;  Marland Kitchen VASECTOMY     Social History   Occupational History  . Occupation: Insurance agent---retired    Comment: [employer] and worker's comp  Tobacco Use  . Smoking status: Former Smoker    Last attempt to quit: 01/18/1973    Years since quitting: 44.5  . Smokeless tobacco: Never Used  Substance and Sexual Activity  . Alcohol use: Yes    Alcohol/week: 4.2 oz    Types: 4 Glasses of wine, 3 Standard drinks or equivalent per week    Comment: 1 drink a day - either wine or cocktail  . Drug use: No  . Sexual activity: Yes

## 2017-07-29 ENCOUNTER — Encounter: Payer: Self-pay | Admitting: Family Medicine

## 2017-08-08 ENCOUNTER — Ambulatory Visit: Payer: Medicare Other | Admitting: Family Medicine

## 2017-08-08 ENCOUNTER — Encounter: Payer: Self-pay | Admitting: Family Medicine

## 2017-08-08 VITALS — BP 164/92 | HR 65 | Temp 98.0°F | Ht 73.0 in | Wt 228.8 lb

## 2017-08-08 DIAGNOSIS — Z01810 Encounter for preprocedural cardiovascular examination: Secondary | ICD-10-CM

## 2017-08-08 DIAGNOSIS — I10 Essential (primary) hypertension: Secondary | ICD-10-CM | POA: Diagnosis not present

## 2017-08-08 DIAGNOSIS — Z8249 Family history of ischemic heart disease and other diseases of the circulatory system: Secondary | ICD-10-CM

## 2017-08-08 MED ORDER — LISINOPRIL 5 MG PO TABS: 5.0000 mg | ORAL_TABLET | Freq: Every day | ORAL | 3 refills | Status: DC

## 2017-08-08 NOTE — Patient Instructions (Addendum)
We will call about your referral.  Victor Ball or Victor Ball will call you if you don't see one of them on the way out.  Start lisinopril 5mg  a day.  Update me if BP consisently >140/>90. Recheck labs in about 2 weeks.  Take care.  Glad to see you.

## 2017-08-08 NOTE — Progress Notes (Signed)
Prev op visit.  Plan for R hip replacement.  Will have surgery with Dr. Ninfa Linden. He had successful L hip replacement prev.  Dw pt about options and risk/benefits with surgery.  He is taking ibuprofen prn for pain, with routine cautions.  He had prev sig flares of pain in the hip.    He is going to silver sneakers and that has helped.  We talked about his FH re: CAD.  No CP, SOB, BLE edema.  He is able to exercise w/o sx.    BP elevation noted.    PMH and SH reviewed  ROS: Per HPI unless specifically indicated in ROS section   Meds, vitals, and allergies reviewed.   GEN: nad, alert and oriented HEENT: mucous membranes moist NECK: supple w/o LA CV: rrr. PULM: ctab, no inc wob ABD: soft, +bs EXT: no edema SKIN: no acute rash  EKG w/o acute changes compared to prev.

## 2017-08-10 ENCOUNTER — Encounter: Payer: Self-pay | Admitting: Cardiology

## 2017-08-10 ENCOUNTER — Encounter: Payer: Self-pay | Admitting: Emergency Medicine

## 2017-08-10 ENCOUNTER — Ambulatory Visit: Payer: Medicare Other | Admitting: Cardiology

## 2017-08-10 VITALS — BP 142/86 | HR 60 | Ht 74.0 in | Wt 231.8 lb

## 2017-08-10 DIAGNOSIS — I10 Essential (primary) hypertension: Secondary | ICD-10-CM

## 2017-08-10 DIAGNOSIS — Z01810 Encounter for preprocedural cardiovascular examination: Secondary | ICD-10-CM | POA: Insufficient documentation

## 2017-08-10 NOTE — Addendum Note (Signed)
Addended by: Ashok Norris on: 08/10/2017 11:29 AM   Modules accepted: Orders

## 2017-08-10 NOTE — Patient Instructions (Signed)
Medication Instructions:  Your physician recommends that you continue on your current medications as directed. Please refer to the Current Medication list given to you today.   Labwork: None.  Testing/Procedures:  Non-Cardiac CT scanning, (CAT scanning), is a noninvasive, special x-ray that produces cross-sectional images of the body using x-rays and a computer. CT scans help physicians diagnose and treat medical conditions. For some CT exams, a contrast material is used to enhance visibility in the area of the body being studied. CT scans provide greater clarity and reveal more details than regular x-ray exams.  Your physician has requested that you have a lexiscan myoview. For further information please visit [email or web address]. Please follow instruction sheet, as given.      Follow-Up: Your physician wants you to follow-up in: 6 months. You will receive a reminder letter in the mail two months in advance. If you don't receive a letter, please call our office to schedule the follow-up appointment.   Any Other Special Instructions Will Be Listed Below (If Applicable).     If you need a refill on your cardiac medications before your next appointment, please call your pharmacy.   Coronary Calcium Scan A coronary calcium scan is an imaging test used to look for deposits of calcium and other fatty materials (plaques) in the inner lining of the blood vessels of the heart (coronary arteries). These deposits of calcium and plaques can partly clog and narrow the coronary arteries without producing any symptoms or warning signs. This puts a person at risk for a heart attack. This test can detect these deposits before symptoms develop. Tell a health care provider about:  Any allergies you have.  All medicines you are taking, including vitamins, herbs, eye drops, creams, and over-the-counter medicines.  Any problems you or family members have had with anesthetic medicines.  Any blood  disorders you have.  Any surgeries you have had.  Any medical conditions you have.  Whether you are pregnant or may be pregnant. What are the risks? Generally, this is a safe procedure. However, problems may occur, including:  Harm to a pregnant woman and her unborn baby. This test involves the use of radiation. Radiation exposure can be dangerous to a pregnant woman and her unborn baby. If you are pregnant, you generally should not have this procedure done.  Slight increase in the risk of cancer. This is because of the radiation involved in the test.  What happens before the procedure? No preparation is needed for this procedure. What happens during the procedure?  You will undress and remove any jewelry around your neck or chest.  You will put on a hospital gown.  Sticky electrodes will be placed on your chest. The electrodes will be connected to an electrocardiogram (ECG) machine to record a tracing of the electrical activity of your heart.  A CT scanner will take pictures of your heart. During this time, you will be asked to lie still and hold your breath for 2-3 seconds while a picture of your heart is being taken. The procedure may vary among health care providers and hospitals. What happens after the procedure?  You can get dressed.  You can return to your normal activities.  It is up to you to get the results of your test. Ask your health care provider, or the department that is doing the test, when your results will be ready. Summary  A coronary calcium scan is an imaging test used to look for deposits of calcium and other fatty  materials (plaques) in the inner lining of the blood vessels of the heart (coronary arteries).  Generally, this is a safe procedure. Tell your health care provider if you are pregnant or may be pregnant.  No preparation is needed for this procedure.  A CT scanner will take pictures of your heart.  You can return to your normal activities  after the scan is done. This information is not intended to replace advice given to you by your health care provider. Make sure you discuss any questions you have with your health care provider. Document Released: 07/03/2007 Document Revised: 11/24/2015 Document Reviewed: 11/24/2015 Elsevier Interactive Patient Education  2017 [employer].  Cardiac Nuclear Scan A cardiac nuclear scan is a test that measures blood flow to the heart when a person is resting and when he or she is exercising. The test looks for problems such as:  Not enough blood reaching a portion of the heart.  The heart muscle not working normally.  You may need this test if:  You have heart disease.  You have had abnormal lab results.  You have had heart surgery or angioplasty.  You have chest pain.  You have shortness of breath.  In this test, a radioactive dye (tracer) is injected into your bloodstream. After the tracer has traveled to your heart, an imaging device is used to measure how much of the tracer is absorbed by or distributed to various areas of your heart. This procedure is usually done at a hospital and takes 2-4 hours. Tell a health care provider about:  Any allergies you have.  All medicines you are taking, including vitamins, herbs, eye drops, creams, and over-the-counter medicines.  Any problems you or family members have had with the use of anesthetic medicines.  Any blood disorders you have.  Any surgeries you have had.  Any medical conditions you have.  Whether you are pregnant or may be pregnant. What are the risks? Generally, this is a safe procedure. However, problems may occur, including:  Serious chest pain and heart attack. This is only a risk if the stress portion of the test is done.  Rapid heartbeat.  Sensation of warmth in your chest. This usually passes quickly.  What happens before the procedure?  Ask your health care provider about changing or stopping your  regular medicines. This is especially important if you are taking diabetes medicines or blood thinners.  Remove your jewelry on the day of the procedure. What happens during the procedure?  An IV tube will be inserted into one of your veins.  Your health care provider will inject a small amount of radioactive tracer through the tube.  You will wait for 20-40 minutes while the tracer travels through your bloodstream.  Your heart activity will be monitored with an electrocardiogram (ECG).  You will lie down on an exam table.  Images of your heart will be taken for about 15-20 minutes.  You may be asked to exercise on a treadmill or stationary bike. While you exercise, your heart's activity will be monitored with an ECG, and your blood pressure will be checked. If you are unable to exercise, you may be given a medicine to increase blood flow to parts of your heart.  When blood flow to your heart has peaked, a tracer will again be injected through the IV tube.  After 20-40 minutes, you will get back on the exam table and have more images taken of your heart.  When the procedure is over, your  IV tube will be removed. The procedure may vary among health care providers and hospitals. Depending on the type of tracer used, scans may need to be repeated 3-4 hours later. What happens after the procedure?  Unless your health care provider tells you otherwise, you may return to your normal schedule, including diet, activities, and medicines.  Unless your health care provider tells you otherwise, you may increase your fluid intake. This will help flush the contrast dye from your body. Drink enough fluid to keep your urine clear or pale yellow.  It is up to you to get your test results. Ask your health care provider, or the department that is doing the test, when your results will be ready. Summary  A cardiac nuclear scan measures the blood flow to the heart when a person is resting and when he or  she is exercising.  You may need this test if you are at risk for heart disease.  Tell your health care provider if you are pregnant.  Unless your health care provider tells you otherwise, increase your fluid intake. This will help flush the contrast dye from your body. Drink enough fluid to keep your urine clear or pale yellow. This information is not intended to replace advice given to you by your health care provider. Make sure you discuss any questions you have with your health care provider. Document Released: 01/30/2004 Document Revised: 01/07/2016 Document Reviewed: 12/13/2012 Elsevier Interactive Patient Education  2017 [employer].

## 2017-08-10 NOTE — Progress Notes (Signed)
Cardiology Office Note:    Date:  08/10/2017   ID:  Victor Ball, DOB 09-Feb-1940, MRN [id number]  PCP:  Tonia Ghent, MD  Cardiologist:  Jenean Lindau, MD   Referring MD: Tonia Ghent, MD    ASSESSMENT:    1. Essential hypertension   2. Preoperative cardiovascular examination    PLAN:    In order of problems listed above:  1. Primary prevention stressed with the patient.  Importance of compliance with diet and medications stressed and he vocalized understanding.  His blood pressure stable at home is mildly elevated here.  He has an element of whitecoat hypertension and I reassured him about this.  For preop 2. For preoperative stratification we will do a Lexiscan sestamibi as he has multiple risk factors for coronary artery disease.  If this is negative then he is not at high risk for coronary events during the aforementioned surgery. 3. Patient also requests calcium scoring for risk stratification.  He is willing to pay for this out of pocket and I will respect his wishes and schedule it. 4. Patient will be seen in follow-up appointment in 6 months or earlier if the patient has any concerns    Medication Adjustments/Labs and Tests Ordered: Current medicines are reviewed at length with the patient today.  Concerns regarding medicines are outlined above.  No orders of the defined types were placed in this encounter.  No orders of the defined types were placed in this encounter.    History of Present Illness:    Victor Ball is a 77 y.o. male who is being seen today for the evaluation of preop cardiovascular risk stratification at the request of Damita Dunnings, Elveria Rising, MD.  Patient is a pleasant 77 year old male.  He has past medical history of essential hypertension.  He mentions to me that he takes care of activities of daily living.  He is planning to undergo hip surgery and that makes him not much mobile.  He is otherwise a fit gentleman is very conscious about his health.   No chest pain orthopnea or PND.  At the time of my evaluation, the patient is alert awake oriented and in no distress.  He wants a cardiac assessment due to multiple risk factors before he undergoes hip surgery.  Past Medical History:  Diagnosis Date  . Hepatitis C, chronic ([place])    treated prev with Harvoni, tested and cleared  . History of kidney stones   . Hyperlipidemia   . Hypertension     Past Surgical History:  Procedure Laterality Date  . APPENDECTOMY  1948  . JOINT REPLACEMENT    . LIVER BIOPSY    . PROSTATE BIOPSY     neg, x2  . TOE FUSION  1985   left great toe  . TONSILLECTOMY AND ADENOIDECTOMY  1945  . TOTAL HIP ARTHROPLASTY Left 11/09/2016   Procedure: LEFT TOTAL HIP ARTHROPLASTY ANTERIOR APPROACH;  Surgeon: Mcarthur Rossetti, MD;  Location: [place];  Service: Orthopedics;  Laterality: Left;  Marland Kitchen VASECTOMY      Current Medications: Current Meds  Medication Sig  . aspirin 81 MG tablet Take 81 mg by mouth daily.   Marland Kitchen lisinopril (PRINIVIL,ZESTRIL) 5 MG tablet Take 1 tablet (5 mg total) by mouth daily.  . Misc Natural Products (TART CHERRY ADVANCED PO) Take 3,000 mg by mouth daily.  . Multiple Vitamins-Minerals (MATURE ADULT CENTURY PO) Take 1 tablet by mouth daily.   Marland Kitchen omega-3 fish oil (MAXEPA) 1000  MG CAPS capsule Take 1 capsule by mouth daily.  Marland Kitchen PUMPKIN SEED PO Take 262 mg by mouth daily.  . Turmeric Curcumin 500 MG CAPS Take 1,000 mg by mouth daily.     Allergies:   Patient has no known allergies.   Social History   Socioeconomic History  . Marital status: Married    Spouse name: Not on file  . Number of children: 2  . Years of education: Not on file  . Highest education level: Not on file  Occupational History  . 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 Financial resource strain: Not on file  . Food insecurity:    Worry: Not on file    Inability: Not on file  . Transportation  needs:    Medical: Not on file    Non-medical: Not on file  Tobacco Use  . Smoking status: Former Smoker    Last attempt to quit: 01/18/1973    Years since quitting: 44.5  . Smokeless tobacco: Never Used  Substance and Sexual Activity  . Alcohol use: Yes    Alcohol/week: 4.2 oz    Types: 4 Glasses of wine, 3 Standard drinks or equivalent per week    Comment: 1 drink a day - either wine or cocktail  . Drug use: No  . Sexual activity: Yes  Lifestyle  . Physical activity:    Days per week: Not on file    Minutes per session: Not on file  . Stress: Not on file  Relationships  . Social connections:    Talks on phone: Not on file    Gets together: Not on file    Attends religious service: Not on file    Active member of club or organization: Not on file    Attends meetings of clubs or organizations: Not on file    Relationship status: Not on file  Other Topics Concern  . Not on file  Social History Narrative   2 daughters   Married 1961   Partially retired from Insurance underwriter, still consulting some as of 2019      Has living will   Wife, then daughters equally if wife were incapacitated, has health care POA.   Would accept resuscitation attempts   Not sure about tube feeds     Family History: The patient's family history includes Cancer in his paternal aunt; Celiac disease in his sister; Diabetes in his father; Heart attack in his brother and father; Heart disease in his brother and father; Stroke in his brother. There is no history of Prostate cancer or Colon cancer.  ROS:   Please see the history of present illness.    All other systems reviewed and are negative.  EKGs/Labs/Other Studies Reviewed:    The following studies were reviewed today: EKG reveals sinus rhythm and bifascicular block nonspecific ST-T changes    Recent Labs: 11/10/2016: Hemoglobin 13.3; Platelets 152 02/18/2017: BUN 17; Creatinine, Ser 1.03; Potassium 4.9; Sodium 142 04/22/2017: ALT 14  Recent Lipid  Panel    Component Value Date/Time   CHOL 154 02/18/2017 0817   TRIG 130.0 02/18/2017 0817   HDL 44.40 02/18/2017 0817   CHOLHDL 3 02/18/2017 0817   VLDL 26.0 02/18/2017 0817   LDLCALC 84 02/18/2017 0817    Physical Exam:    VS:  BP (!) 142/86 (BP Location: Right Arm, Patient Position: Sitting, Cuff Size: Large)   Pulse 60   Ht 6\' 2"  (1.88 m)  Wt 231 lb 12.8 oz (105.1 kg)   SpO2 98%   BMI 29.76 kg/m     Wt Readings from Last 3 Encounters:  08/10/17 231 lb 12.8 oz (105.1 kg)  08/08/17 228 lb 12 oz (103.8 kg)  02/22/17 234 lb (106.1 kg)     GEN: Patient is in no acute distress HEENT: Normal NECK: No JVD; No carotid bruits LYMPHATICS: No lymphadenopathy CARDIAC: S1 S2 regular, 2/6 systolic murmur at the apex. RESPIRATORY:  Clear to auscultation without rales, wheezing or rhonchi  ABDOMEN: Soft, non-tender, non-distended MUSCULOSKELETAL:  No edema; No deformity  SKIN: Warm and dry NEUROLOGIC:  Alert and oriented x 3 PSYCHIATRIC:  Normal affect    Signed, Jenean Lindau, MD  08/10/2017 10:19 AM    [place]

## 2017-08-11 ENCOUNTER — Telehealth (HOSPITAL_COMMUNITY): Payer: Self-pay | Admitting: *Deleted

## 2017-08-11 ENCOUNTER — Encounter: Payer: Self-pay | Admitting: Family Medicine

## 2017-08-11 NOTE — Assessment & Plan Note (Signed)
EKG without acute changes.  Discussed with patient about options.  Refer to cardiology for evaluation given his family history.  Detailed conversation with patient.  He agrees. >25 minutes spent in face to face time with patient, >50% spent in counselling or coordination of care.

## 2017-08-11 NOTE — Telephone Encounter (Signed)
Patient given detailed instructions per Myocardial Perfusion Study Information Sheet for the test on 08/15/17 at 10:30. Patient notified to arrive 15 minutes early and that it is imperative to arrive on time for appointment to keep from having the test rescheduled.  If you need to cancel or reschedule your appointment, please call the office within 24 hours of your appointment. . Patient verbalized understanding. Victor Ball

## 2017-08-11 NOTE — Assessment & Plan Note (Signed)
Start lisinopril 5mg  a day.  Update me if BP consisently >140/>90. Recheck labs in about 2 weeks.  He agrees.

## 2017-08-15 ENCOUNTER — Ambulatory Visit (HOSPITAL_COMMUNITY): Payer: Medicare Other | Attending: Cardiovascular Disease

## 2017-08-15 ENCOUNTER — Ambulatory Visit (INDEPENDENT_AMBULATORY_CARE_PROVIDER_SITE_OTHER)
Admission: RE | Admit: 2017-08-15 | Discharge: 2017-08-15 | Disposition: A | Discharge status: Home / Self Care | Payer: Medicare Other | Source: Ambulatory Visit | Attending: Cardiology | Admitting: Cardiology

## 2017-08-15 VITALS — Ht 74.0 in | Wt 231.0 lb

## 2017-08-15 DIAGNOSIS — I10 Essential (primary) hypertension: Secondary | ICD-10-CM | POA: Insufficient documentation

## 2017-08-15 DIAGNOSIS — Z01810 Encounter for preprocedural cardiovascular examination: Secondary | ICD-10-CM | POA: Insufficient documentation

## 2017-08-15 LAB — MYOCARDIAL PERFUSION IMAGING
CHL CUP NUCLEAR SSS: 13
CSEPPHR: 72 {beats}/min
LHR: 0.3
LV sys vol: 83 mL
LVDIAVOL: 148 mL (ref 62–150)
Rest HR: 55 {beats}/min
SDS: 2
SRS: 11
TID: 0.97

## 2017-08-15 MED ORDER — REGADENOSON 0.4 MG/5ML IV SOLN
0.4000 mg | Freq: Once | INTRAVENOUS | Status: AC
  Administered 2017-08-15: 0.4 mg via INTRAVENOUS

## 2017-08-15 MED ORDER — TECHNETIUM TC 99M TETROFOSMIN IV KIT
10.7000 | PACK | Freq: Once | INTRAVENOUS | Status: AC | PRN
  Administered 2017-08-15: 10.7 via INTRAVENOUS
  Filled 2017-08-15: qty 11

## 2017-08-15 MED ORDER — TECHNETIUM TC 99M TETROFOSMIN IV KIT
32.4000 | PACK | Freq: Once | INTRAVENOUS | Status: AC | PRN
  Administered 2017-08-15: 32.4 via INTRAVENOUS
  Filled 2017-08-15: qty 33

## 2017-08-16 ENCOUNTER — Encounter (INDEPENDENT_AMBULATORY_CARE_PROVIDER_SITE_OTHER): Payer: Self-pay

## 2017-08-16 ENCOUNTER — Other Ambulatory Visit: Payer: Self-pay | Admitting: *Deleted

## 2017-08-16 ENCOUNTER — Telehealth: Payer: Self-pay | Admitting: *Deleted

## 2017-08-16 ENCOUNTER — Encounter: Payer: Self-pay | Admitting: *Deleted

## 2017-08-16 DIAGNOSIS — I2510 Atherosclerotic heart disease of native coronary artery without angina pectoris: Secondary | ICD-10-CM

## 2017-08-16 DIAGNOSIS — R7989 Other specified abnormal findings of blood chemistry: Secondary | ICD-10-CM

## 2017-08-16 DIAGNOSIS — I2584 Coronary atherosclerosis due to calcified coronary lesion: Secondary | ICD-10-CM

## 2017-08-16 DIAGNOSIS — R945 Abnormal results of liver function studies: Principal | ICD-10-CM

## 2017-08-16 DIAGNOSIS — Z01810 Encounter for preprocedural cardiovascular examination: Secondary | ICD-10-CM

## 2017-08-16 DIAGNOSIS — E785 Hyperlipidemia, unspecified: Secondary | ICD-10-CM

## 2017-08-16 NOTE — Telephone Encounter (Signed)
Left message for patient to return call regarding where cardiac clearance information needs to be sent.

## 2017-08-17 ENCOUNTER — Ambulatory Visit (HOSPITAL_BASED_OUTPATIENT_CLINIC_OR_DEPARTMENT_OTHER)
Admission: RE | Admit: 2017-08-17 | Discharge: 2017-08-17 | Disposition: A | Discharge status: Home / Self Care | Payer: Medicare Other | Source: Ambulatory Visit | Attending: Cardiology | Admitting: Cardiology

## 2017-08-17 DIAGNOSIS — I361 Nonrheumatic tricuspid (valve) insufficiency: Secondary | ICD-10-CM | POA: Insufficient documentation

## 2017-08-17 DIAGNOSIS — E785 Hyperlipidemia, unspecified: Secondary | ICD-10-CM | POA: Diagnosis not present

## 2017-08-17 DIAGNOSIS — I2510 Atherosclerotic heart disease of native coronary artery without angina pectoris: Secondary | ICD-10-CM

## 2017-08-17 DIAGNOSIS — I2584 Coronary atherosclerosis due to calcified coronary lesion: Secondary | ICD-10-CM

## 2017-08-17 DIAGNOSIS — I10 Essential (primary) hypertension: Secondary | ICD-10-CM | POA: Insufficient documentation

## 2017-08-17 DIAGNOSIS — Z01810 Encounter for preprocedural cardiovascular examination: Secondary | ICD-10-CM | POA: Diagnosis not present

## 2017-08-17 NOTE — Progress Notes (Signed)
  Echocardiogram 2D Echocardiogram has been performed.  Victor Ball 08/17/2017, 1:52 PM

## 2017-08-18 NOTE — Telephone Encounter (Signed)
Patient's wife, Jeani Hawking, per [facility] informed of echocardiogram and CT calcium scoring results. Josefina Do that Dr. Geraldo Pitter has cleared the patient for surgery so I will send the appropriate documentation to Dr. Damita Dunnings and Dr. Ninfa Linden as requested by patient. Informed Jeani Hawking that Dr. Geraldo Pitter wants follow up lab work drawn and explained they can go to the nearest [place], address provided to her for [place] location. Advised her to have patient fast before labs are drawn. No appointment needed Jeani Hawking verbalized understanding. No further questions.

## 2017-08-18 NOTE — Addendum Note (Signed)
Addended by: Austin Miles on: 08/18/2017 02:35 PM   Modules accepted: Orders

## 2017-08-19 DIAGNOSIS — R945 Abnormal results of liver function studies: Secondary | ICD-10-CM | POA: Diagnosis not present

## 2017-08-19 DIAGNOSIS — E785 Hyperlipidemia, unspecified: Secondary | ICD-10-CM | POA: Diagnosis not present

## 2017-08-20 LAB — LIPID PANEL
CHOLESTEROL TOTAL: 158 mg/dL (ref 100–199)
Chol/HDL Ratio: 4.2 ratio (ref 0.0–5.0)
HDL: 38 mg/dL — AB (ref >39)
LDL CALC: 95 mg/dL (ref 0–99)
TRIGLYCERIDES: 125 mg/dL (ref 0–149)
VLDL Cholesterol Cal: 25 mg/dL (ref 5–40)

## 2017-08-20 LAB — HEPATIC FUNCTION PANEL
ALK PHOS: 80 IU/L (ref 39–117)
ALT: 13 IU/L (ref 0–44)
AST: 18 IU/L (ref 0–40)
Albumin: 4.3 g/dL (ref 3.5–4.8)
Bilirubin Total: 0.6 mg/dL (ref 0.0–1.2)
Bilirubin, Direct: 0.15 mg/dL (ref 0.00–0.40)
TOTAL PROTEIN: 6.7 g/dL (ref 6.0–8.5)

## 2017-08-22 ENCOUNTER — Telehealth: Payer: Self-pay

## 2017-08-22 DIAGNOSIS — I7090 Unspecified atherosclerosis: Secondary | ICD-10-CM

## 2017-08-22 DIAGNOSIS — R931 Abnormal findings on diagnostic imaging of heart and coronary circulation: Secondary | ICD-10-CM

## 2017-08-22 NOTE — Telephone Encounter (Signed)
Patient informed of lab results and advised that Dr. Geraldo Pitter wants to initiate atorvastain 10 mg daily and recheck labs in 6 weeks. Patient states he was on a statin medication 10 years ago that worked really well for him and would like to find out what that medication is before agreeing to initiate therapy. Patient states he will contact our office with his decision in the next day or two. No further questions.

## 2017-08-22 NOTE — Telephone Encounter (Signed)
Requested that the patient call the office to discuss labs.

## 2017-08-23 MED ORDER — ATORVASTATIN CALCIUM 10 MG PO TABS: 10.0000 mg | ORAL_TABLET | Freq: Every day | ORAL | 2 refills | Status: DC

## 2017-08-23 NOTE — Addendum Note (Signed)
Addended by: Austin Miles on: 08/23/2017 02:11 PM   Modules accepted: Orders

## 2017-08-23 NOTE — Telephone Encounter (Signed)
Patient called back and said he wanted to start atorvastatin (lipitor) 10 mg daily. New prescription sent to [facility] in [place] as requested by patient. Advised patient to go to the [facility] in [place] in 6 weeks (week of 10/03/17) for repeat lab work to be drawn, no appointment needed. Patient will fast beforehand.

## 2017-08-29 DIAGNOSIS — D2270 Melanocytic nevi of unspecified lower limb, including hip: Secondary | ICD-10-CM | POA: Diagnosis not present

## 2017-08-29 DIAGNOSIS — L570 Actinic keratosis: Secondary | ICD-10-CM | POA: Diagnosis not present

## 2017-08-29 DIAGNOSIS — Z1283 Encounter for screening for malignant neoplasm of skin: Secondary | ICD-10-CM | POA: Diagnosis not present

## 2017-08-29 DIAGNOSIS — D225 Melanocytic nevi of trunk: Secondary | ICD-10-CM | POA: Diagnosis not present

## 2017-08-29 DIAGNOSIS — D2260 Melanocytic nevi of unspecified upper limb, including shoulder: Secondary | ICD-10-CM | POA: Diagnosis not present

## 2017-08-29 DIAGNOSIS — L821 Other seborrheic keratosis: Secondary | ICD-10-CM | POA: Diagnosis not present

## 2017-08-30 ENCOUNTER — Other Ambulatory Visit (INDEPENDENT_AMBULATORY_CARE_PROVIDER_SITE_OTHER): Payer: Self-pay | Admitting: Physician Assistant

## 2017-09-01 NOTE — Pre-Procedure Instructions (Signed)
Victor Ball  09/01/2017      [place], [place] [address] [place] [address] Phone: [phone] Fax: [phone]    Your procedure is scheduled on Aug. 27, 2019 from 1:00PM-4:51PM  Report to [facility] Admitting Entrance "A" at 11:00AM  Call this number if you have problems the morning of surgery:  [phone]   Remember:  Do not eat or drink after midnight on Aug. 26th    Take these medicines the morning of surgery with A SIP OF WATER: NONE  7 days before surgery (8/20), stop taking all Other Aspirin Products, Vitamins, Fish oils, and Herbal medications. Also stop all NSAIDS i.e. Advil, Ibuprofen, Motrin, Aleve, Anaprox, Naproxen, BC, Goody Powders, and all Supplements.     Do not wear jewelry.  Do not wear lotions, powders, colognes, or deodorant.  Do not shave 48 hours prior to surgery.  Men may shave face.  Do not bring valuables to the hospital.  [facility] is not responsible for any belongings or valuables.  Contacts, dentures or bridgework may not be worn into surgery.  Leave your suitcase in the car.  After surgery it may be brought to your room.  For patients admitted to the hospital, discharge time will be determined by your treatment team.  Patients discharged the day of surgery will not be allowed to drive home.    Special instructions:   [place]- Preparing For Surgery  Before surgery, you can play an important role. Because skin is not sterile, your skin needs to be as free of germs as possible. You can reduce the number of germs on your skin by washing with CHG (chlorahexidine gluconate) Soap before surgery.  CHG is an antiseptic cleaner which kills germs and bonds with the skin to continue killing germs even after washing.    Oral Hygiene is also important to reduce your risk of infection.  Remember - BRUSH YOUR TEETH THE MORNING OF SURGERY WITH YOUR REGULAR  TOOTHPASTE  Please do not use if you have an allergy to CHG or antibacterial soaps. If your skin becomes reddened/irritated stop using the CHG.  Do not shave (including legs and underarms) for at least 48 hours prior to first CHG shower. It is OK to shave your face.  Please follow these instructions carefully.   1. Shower the NIGHT BEFORE SURGERY and the MORNING OF SURGERY with CHG.   2. If you chose to wash your hair, wash your hair first as usual with your normal shampoo.  3. After you shampoo, rinse your hair and body thoroughly to remove the shampoo.  4. Use CHG as you would any other liquid soap. You can apply CHG directly to the skin and wash gently with a scrungie or a clean washcloth.   5. Apply the CHG Soap to your body ONLY FROM THE NECK DOWN.  Do not use on open wounds or open sores. Avoid contact with your eyes, ears, mouth and genitals (private parts). Wash Face and genitals (private parts)  with your normal soap.  6. Wash thoroughly, paying special attention to the area where your surgery will be performed.  7. Thoroughly rinse your body with warm water from the neck down.  8. DO NOT shower/wash with your normal soap after using and rinsing off the CHG Soap.  9. Pat yourself dry with a CLEAN TOWEL.  10. Wear CLEAN PAJAMAS to bed the night before surgery,  wear comfortable clothes the morning of surgery  11. Place CLEAN SHEETS on your bed the night of your first shower and DO NOT SLEEP WITH PETS.  Day of Surgery:  Do not apply any deodorants/lotions.  Please wear clean clothes to the hospital/surgery center.   Remember to brush your teeth WITH YOUR REGULAR TOOTHPASTE.  Please read over the following fact sheets that you were given. Pain Booklet, Coughing and Deep Breathing, MRSA Information and Surgical Site Infection Prevention

## 2017-09-02 ENCOUNTER — Encounter (HOSPITAL_COMMUNITY)
Admission: RE | Admit: 2017-09-02 | Discharge: 2017-09-02 | Disposition: A | Discharge status: Home / Self Care | Payer: Medicare Other | Source: Ambulatory Visit | Attending: Orthopaedic Surgery | Admitting: Orthopaedic Surgery

## 2017-09-02 ENCOUNTER — Encounter (HOSPITAL_COMMUNITY): Payer: Self-pay

## 2017-09-02 ENCOUNTER — Other Ambulatory Visit: Payer: Self-pay

## 2017-09-02 DIAGNOSIS — I10 Essential (primary) hypertension: Secondary | ICD-10-CM | POA: Diagnosis not present

## 2017-09-02 DIAGNOSIS — M1611 Unilateral primary osteoarthritis, right hip: Secondary | ICD-10-CM | POA: Insufficient documentation

## 2017-09-02 DIAGNOSIS — E785 Hyperlipidemia, unspecified: Secondary | ICD-10-CM | POA: Diagnosis not present

## 2017-09-02 DIAGNOSIS — Z01812 Encounter for preprocedural laboratory examination: Secondary | ICD-10-CM | POA: Diagnosis not present

## 2017-09-02 LAB — COMPREHENSIVE METABOLIC PANEL
ALBUMIN: 4 g/dL (ref 3.5–5.0)
ALK PHOS: 71 U/L (ref 38–126)
ALT: 17 U/L (ref 0–44)
ANION GAP: 8 (ref 5–15)
AST: 22 U/L (ref 15–41)
BUN: 17 mg/dL (ref 8–23)
CALCIUM: 9.2 mg/dL (ref 8.9–10.3)
CHLORIDE: 104 mmol/L (ref 98–111)
CO2: 25 mmol/L (ref 22–32)
Creatinine, Ser: 0.89 mg/dL (ref 0.61–1.24)
GFR calc Af Amer: >60 mL/min (ref >60)
GFR calc non Af Amer: >60 mL/min (ref >60)
GLUCOSE: 85 mg/dL (ref 70–99)
Potassium: 4.6 mmol/L (ref 3.5–5.1)
SODIUM: 137 mmol/L (ref 135–145)
Total Bilirubin: 0.8 mg/dL (ref 0.3–1.2)
Total Protein: 6.8 g/dL (ref 6.5–8.1)

## 2017-09-02 LAB — CBC
HCT: 48.6 % (ref 39.0–52.0)
HEMOGLOBIN: 15.9 g/dL (ref 13.0–17.0)
MCH: 30.9 pg (ref 26.0–34.0)
MCHC: 32.7 g/dL (ref 30.0–36.0)
MCV: 94.4 fL (ref 78.0–100.0)
Platelets: 178 10*3/uL (ref 150–400)
RBC: 5.15 MIL/uL (ref 4.22–5.81)
RDW: 13 % (ref 11.5–15.5)
WBC: 7.5 10*3/uL (ref 4.0–10.5)

## 2017-09-02 LAB — SURGICAL PCR SCREEN
MRSA, PCR: NEGATIVE
STAPHYLOCOCCUS AUREUS: NEGATIVE

## 2017-09-02 NOTE — Progress Notes (Addendum)
PCP: Elsie Stain, MD  Cardiologist: Sarina Ser, MD  EKG: 08/08/17 in EPIC  Stress test: 08/10/17 in EPIC  ECHO: 08/17/17 in Epic  Cardiac Cath: pt denies  Chest x-ray: pt denies past year, no recent respiratory infections

## 2017-09-12 MED ORDER — CEFAZOLIN SODIUM-DEXTROSE 2-4 GM/100ML-% IV SOLN
2.0000 g | INTRAVENOUS | Status: AC
  Administered 2017-09-13: 2 g via INTRAVENOUS
  Filled 2017-09-12: qty 100

## 2017-09-12 MED ORDER — SODIUM CHLORIDE 0.9 % IV SOLN
1000.0000 mg | INTRAVENOUS | Status: AC
  Administered 2017-09-13: 1000 mg via INTRAVENOUS
  Filled 2017-09-12: qty 1000

## 2017-09-13 ENCOUNTER — Inpatient Hospital Stay (HOSPITAL_COMMUNITY)
Admission: RE | Admit: 2017-09-13 | Discharge: 2017-09-15 | DRG: 470 | Disposition: A | Discharge status: Home Health | Payer: Medicare Other | Attending: Orthopaedic Surgery | Admitting: Orthopaedic Surgery

## 2017-09-13 ENCOUNTER — Encounter (HOSPITAL_COMMUNITY): Payer: Self-pay | Admitting: Anesthesiology

## 2017-09-13 ENCOUNTER — Inpatient Hospital Stay (HOSPITAL_COMMUNITY): Payer: Medicare Other | Admitting: Anesthesiology

## 2017-09-13 ENCOUNTER — Inpatient Hospital Stay (HOSPITAL_COMMUNITY): Payer: Medicare Other

## 2017-09-13 ENCOUNTER — Encounter (HOSPITAL_COMMUNITY)
Admission: RE | Disposition: A | Discharge status: Home Health | Payer: Self-pay | Source: Home / Self Care | Attending: Orthopaedic Surgery

## 2017-09-13 DIAGNOSIS — Z96642 Presence of left artificial hip joint: Secondary | ICD-10-CM | POA: Diagnosis not present

## 2017-09-13 DIAGNOSIS — Z79899 Other long term (current) drug therapy: Secondary | ICD-10-CM

## 2017-09-13 DIAGNOSIS — Z87891 Personal history of nicotine dependence: Secondary | ICD-10-CM

## 2017-09-13 DIAGNOSIS — Z96641 Presence of right artificial hip joint: Secondary | ICD-10-CM | POA: Diagnosis not present

## 2017-09-13 DIAGNOSIS — Z419 Encounter for procedure for purposes other than remedying health state, unspecified: Secondary | ICD-10-CM

## 2017-09-13 DIAGNOSIS — Z7982 Long term (current) use of aspirin: Secondary | ICD-10-CM

## 2017-09-13 DIAGNOSIS — I10 Essential (primary) hypertension: Secondary | ICD-10-CM | POA: Diagnosis not present

## 2017-09-13 DIAGNOSIS — E785 Hyperlipidemia, unspecified: Secondary | ICD-10-CM | POA: Diagnosis not present

## 2017-09-13 DIAGNOSIS — M1611 Unilateral primary osteoarthritis, right hip: Secondary | ICD-10-CM

## 2017-09-13 DIAGNOSIS — Z471 Aftercare following joint replacement surgery: Secondary | ICD-10-CM | POA: Diagnosis not present

## 2017-09-13 DIAGNOSIS — M25551 Pain in right hip: Secondary | ICD-10-CM | POA: Diagnosis present

## 2017-09-13 HISTORY — PX: TOTAL HIP ARTHROPLASTY: SHX124

## 2017-09-13 LAB — GLUCOSE, CAPILLARY: Glucose-Capillary: 92 mg/dL (ref 70–99)

## 2017-09-13 SURGERY — ARTHROPLASTY, HIP, TOTAL, ANTERIOR APPROACH
Anesthesia: Monitor Anesthesia Care | Site: Hip | Laterality: Right

## 2017-09-13 MED ORDER — PHENOL 1.4 % MT LIQD: 1.0000 | OROMUCOSAL | Status: DC | PRN

## 2017-09-13 MED ORDER — ZOLPIDEM TARTRATE 5 MG PO TABS: 5.0000 mg | ORAL_TABLET | Freq: Every evening | ORAL | Status: DC | PRN

## 2017-09-13 MED ORDER — SODIUM CHLORIDE 0.9 % IR SOLN
Status: DC | PRN
  Administered 2017-09-13: 3000 mL

## 2017-09-13 MED ORDER — CEFAZOLIN SODIUM-DEXTROSE 1-4 GM/50ML-% IV SOLN
1.0000 g | Freq: Four times a day (QID) | INTRAVENOUS | Status: AC
  Administered 2017-09-13 – 2017-09-14 (×2): 1 g via INTRAVENOUS
  Filled 2017-09-13 (×2): qty 50

## 2017-09-13 MED ORDER — 0.9 % SODIUM CHLORIDE (POUR BTL) OPTIME
TOPICAL | Status: DC | PRN
  Administered 2017-09-13: 1000 mL

## 2017-09-13 MED ORDER — FENTANYL CITRATE (PF) 100 MCG/2ML IJ SOLN: 25.0000 ug | INTRAMUSCULAR | Status: DC | PRN

## 2017-09-13 MED ORDER — MIDAZOLAM HCL 2 MG/2ML IJ SOLN
INTRAMUSCULAR | Status: AC
  Filled 2017-09-13: qty 2

## 2017-09-13 MED ORDER — DOCUSATE SODIUM 100 MG PO CAPS
100.0000 mg | ORAL_CAPSULE | Freq: Two times a day (BID) | ORAL | Status: DC
  Administered 2017-09-13 – 2017-09-15 (×4): 100 mg via ORAL
  Filled 2017-09-13 (×4): qty 1

## 2017-09-13 MED ORDER — MIDAZOLAM HCL 5 MG/5ML IJ SOLN
INTRAMUSCULAR | Status: DC | PRN
  Administered 2017-09-13: 2 mg via INTRAVENOUS

## 2017-09-13 MED ORDER — ASPIRIN 81 MG PO CHEW
81.0000 mg | CHEWABLE_TABLET | Freq: Two times a day (BID) | ORAL | Status: DC
  Administered 2017-09-13 – 2017-09-15 (×4): 81 mg via ORAL
  Filled 2017-09-13 (×4): qty 1

## 2017-09-13 MED ORDER — SODIUM CHLORIDE 0.9 % IV SOLN
INTRAVENOUS | Status: DC | PRN
  Administered 2017-09-13: 50 ug/min via INTRAVENOUS

## 2017-09-13 MED ORDER — LISINOPRIL 5 MG PO TABS
5.0000 mg | ORAL_TABLET | Freq: Every day | ORAL | Status: DC
  Administered 2017-09-13 – 2017-09-15 (×3): 5 mg via ORAL
  Filled 2017-09-13 (×3): qty 1

## 2017-09-13 MED ORDER — PANTOPRAZOLE SODIUM 40 MG PO TBEC
40.0000 mg | DELAYED_RELEASE_TABLET | Freq: Every day | ORAL | Status: DC
  Administered 2017-09-13 – 2017-09-15 (×3): 40 mg via ORAL
  Filled 2017-09-13 (×3): qty 1

## 2017-09-13 MED ORDER — METHOCARBAMOL 1000 MG/10ML IJ SOLN
500.0000 mg | Freq: Four times a day (QID) | INTRAVENOUS | Status: DC | PRN
  Filled 2017-09-13: qty 5

## 2017-09-13 MED ORDER — HYDROMORPHONE HCL 1 MG/ML IJ SOLN
0.5000 mg | INTRAMUSCULAR | Status: DC | PRN
  Administered 2017-09-13: 1 mg via INTRAVENOUS
  Filled 2017-09-13: qty 1

## 2017-09-13 MED ORDER — ACETAMINOPHEN 325 MG PO TABS: 325.0000 mg | ORAL_TABLET | Freq: Four times a day (QID) | ORAL | Status: DC | PRN

## 2017-09-13 MED ORDER — ONDANSETRON HCL 4 MG/2ML IJ SOLN: 4.0000 mg | Freq: Four times a day (QID) | INTRAMUSCULAR | Status: DC | PRN

## 2017-09-13 MED ORDER — BUPIVACAINE IN DEXTROSE 0.75-8.25 % IT SOLN
INTRATHECAL | Status: DC | PRN
  Administered 2017-09-13: 2 mL via INTRATHECAL

## 2017-09-13 MED ORDER — METOCLOPRAMIDE HCL 5 MG PO TABS: 5.0000 mg | ORAL_TABLET | Freq: Three times a day (TID) | ORAL | Status: DC | PRN

## 2017-09-13 MED ORDER — LACTATED RINGERS IV SOLN
INTRAVENOUS | Status: DC
  Administered 2017-09-13: 11:00:00 via INTRAVENOUS

## 2017-09-13 MED ORDER — FENTANYL CITRATE (PF) 250 MCG/5ML IJ SOLN
INTRAMUSCULAR | Status: AC
  Filled 2017-09-13: qty 5

## 2017-09-13 MED ORDER — POLYETHYLENE GLYCOL 3350 17 G PO PACK: 17.0000 g | PACK | Freq: Every day | ORAL | Status: DC | PRN

## 2017-09-13 MED ORDER — CHLORHEXIDINE GLUCONATE 4 % EX LIQD: 60.0000 mL | Freq: Once | CUTANEOUS | Status: DC

## 2017-09-13 MED ORDER — FENTANYL CITRATE (PF) 100 MCG/2ML IJ SOLN
INTRAMUSCULAR | Status: DC | PRN
  Administered 2017-09-13: 50 ug via INTRAVENOUS

## 2017-09-13 MED ORDER — METHOCARBAMOL 500 MG PO TABS
500.0000 mg | ORAL_TABLET | Freq: Four times a day (QID) | ORAL | Status: DC | PRN
  Administered 2017-09-13: 500 mg via ORAL
  Filled 2017-09-13: qty 1

## 2017-09-13 MED ORDER — LACTATED RINGERS IV SOLN
INTRAVENOUS | Status: DC | PRN
  Administered 2017-09-13 (×2): via INTRAVENOUS

## 2017-09-13 MED ORDER — ONDANSETRON HCL 4 MG PO TABS: 4.0000 mg | ORAL_TABLET | Freq: Four times a day (QID) | ORAL | Status: DC | PRN

## 2017-09-13 MED ORDER — ALUM & MAG HYDROXIDE-SIMETH 200-200-20 MG/5ML PO SUSP: 30.0000 mL | ORAL | Status: DC | PRN

## 2017-09-13 MED ORDER — SODIUM CHLORIDE 0.9 % IV SOLN
INTRAVENOUS | Status: DC
  Administered 2017-09-13: 17:00:00 via INTRAVENOUS

## 2017-09-13 MED ORDER — OXYCODONE HCL 5 MG/5ML PO SOLN: 5.0000 mg | Freq: Once | ORAL | Status: DC | PRN

## 2017-09-13 MED ORDER — DIPHENHYDRAMINE HCL 12.5 MG/5ML PO ELIX: 12.5000 mg | ORAL_SOLUTION | ORAL | Status: DC | PRN

## 2017-09-13 MED ORDER — OXYCODONE HCL 5 MG PO TABS: 10.0000 mg | ORAL_TABLET | ORAL | Status: DC | PRN

## 2017-09-13 MED ORDER — PROPOFOL 500 MG/50ML IV EMUL
INTRAVENOUS | Status: DC | PRN
  Administered 2017-09-13: 75 ug/kg/min via INTRAVENOUS

## 2017-09-13 MED ORDER — OXYCODONE HCL 5 MG PO TABS
5.0000 mg | ORAL_TABLET | ORAL | Status: DC | PRN
  Administered 2017-09-13: 5 mg via ORAL
  Administered 2017-09-14: 10 mg via ORAL
  Filled 2017-09-13: qty 2
  Filled 2017-09-13: qty 1

## 2017-09-13 MED ORDER — ATORVASTATIN CALCIUM 10 MG PO TABS
10.0000 mg | ORAL_TABLET | Freq: Every day | ORAL | Status: DC
  Administered 2017-09-13 – 2017-09-15 (×3): 10 mg via ORAL
  Filled 2017-09-13 (×3): qty 1

## 2017-09-13 MED ORDER — METOCLOPRAMIDE HCL 5 MG/ML IJ SOLN: 5.0000 mg | Freq: Three times a day (TID) | INTRAMUSCULAR | Status: DC | PRN

## 2017-09-13 MED ORDER — MENTHOL 3 MG MT LOZG: 1.0000 | LOZENGE | OROMUCOSAL | Status: DC | PRN

## 2017-09-13 MED ORDER — OXYCODONE HCL 5 MG PO TABS: 5.0000 mg | ORAL_TABLET | Freq: Once | ORAL | Status: DC | PRN

## 2017-09-13 SURGICAL SUPPLY — 55 items
BENZOIN TINCTURE PRP APPL 2/3 (GAUZE/BANDAGES/DRESSINGS) ×2 IMPLANT
BLADE CLIPPER SURG (BLADE) IMPLANT
BLADE SAW SGTL 18X1.27X75 (BLADE) ×2 IMPLANT
CELLS DAT CNTRL 66122 CELL SVR (MISCELLANEOUS) ×1 IMPLANT
COVER SURGICAL LIGHT HANDLE (MISCELLANEOUS) ×2 IMPLANT
CUP ACET PINNACLE SECTR 60MM (Hips) ×1 IMPLANT
DRAPE C-ARM 42X72 X-RAY (DRAPES) ×2 IMPLANT
DRAPE STERI IOBAN 125X83 (DRAPES) ×2 IMPLANT
DRAPE U-SHAPE 47X51 STRL (DRAPES) ×6 IMPLANT
DRSG AQUACEL AG ADV 3.5X10 (GAUZE/BANDAGES/DRESSINGS) ×2 IMPLANT
DURAPREP 26ML APPLICATOR (WOUND CARE) ×2 IMPLANT
ELECT BLADE 4.0 EZ CLEAN MEGAD (MISCELLANEOUS) ×2
ELECT BLADE 6.5 EXT (BLADE) ×2 IMPLANT
ELECT REM PT RETURN 9FT ADLT (ELECTROSURGICAL) ×2
ELECTRODE BLDE 4.0 EZ CLN MEGD (MISCELLANEOUS) ×1 IMPLANT
ELECTRODE REM PT RTRN 9FT ADLT (ELECTROSURGICAL) ×1 IMPLANT
FACESHIELD WRAPAROUND (MASK) ×4 IMPLANT
GAUZE XEROFORM 1X8 LF (GAUZE/BANDAGES/DRESSINGS) ×2 IMPLANT
GLOVE BIOGEL PI IND STRL 8 (GLOVE) ×2 IMPLANT
GLOVE BIOGEL PI INDICATOR 8 (GLOVE) ×2
GLOVE ECLIPSE 8.0 STRL XLNG CF (GLOVE) ×2 IMPLANT
GLOVE ORTHO TXT STRL SZ7.5 (GLOVE) ×4 IMPLANT
GLOVE SURG ORTHO 8.0 STRL STRW (GLOVE) ×2 IMPLANT
GOWN STRL REUS W/ TWL LRG LVL3 (GOWN DISPOSABLE) ×2 IMPLANT
GOWN STRL REUS W/ TWL XL LVL3 (GOWN DISPOSABLE) ×2 IMPLANT
GOWN STRL REUS W/TWL LRG LVL3 (GOWN DISPOSABLE) ×2
GOWN STRL REUS W/TWL XL LVL3 (GOWN DISPOSABLE) ×2
HANDPIECE INTERPULSE COAX TIP (DISPOSABLE) ×1
HEAD CERAMIC DELTA 36 PLUS 1.5 (Hips) ×2 IMPLANT
KIT BASIN OR (CUSTOM PROCEDURE TRAY) ×2 IMPLANT
KIT TURNOVER KIT B (KITS) ×2 IMPLANT
LINER NEUTRAL 58X36MM PLUS4 ×2 IMPLANT
MANIFOLD NEPTUNE II (INSTRUMENTS) ×2 IMPLANT
NS IRRIG 1000ML POUR BTL (IV SOLUTION) ×2 IMPLANT
PACK TOTAL JOINT (CUSTOM PROCEDURE TRAY) ×2 IMPLANT
PAD ARMBOARD 7.5X6 YLW CONV (MISCELLANEOUS) ×2 IMPLANT
PINNSECTOR W/GRIP ACE CUP 60MM (Hips) ×2 IMPLANT
RTRCTR WOUND ALEXIS 18CM MED (MISCELLANEOUS) ×2
SET HNDPC FAN SPRY TIP SCT (DISPOSABLE) ×1 IMPLANT
STAPLER VISISTAT 35W (STAPLE) IMPLANT
STEM CORAIL KLA14 (Stem) ×2 IMPLANT
STRIP CLOSURE SKIN 1/2X4 (GAUZE/BANDAGES/DRESSINGS) ×4 IMPLANT
SUT ETHIBOND NAB CT1 #1 30IN (SUTURE) ×2 IMPLANT
SUT MNCRL AB 4-0 PS2 18 (SUTURE) IMPLANT
SUT VIC AB 0 CT1 27 (SUTURE) ×1
SUT VIC AB 0 CT1 27XBRD ANBCTR (SUTURE) ×1 IMPLANT
SUT VIC AB 1 CT1 27 (SUTURE) ×1
SUT VIC AB 1 CT1 27XBRD ANBCTR (SUTURE) ×1 IMPLANT
SUT VIC AB 2-0 CT1 27 (SUTURE) ×1
SUT VIC AB 2-0 CT1 TAPERPNT 27 (SUTURE) ×1 IMPLANT
TOWEL OR 17X24 6PK STRL BLUE (TOWEL DISPOSABLE) ×2 IMPLANT
TOWEL OR 17X26 10 PK STRL BLUE (TOWEL DISPOSABLE) ×2 IMPLANT
TRAY CATH 16FR W/PLASTIC CATH (SET/KITS/TRAYS/PACK) ×2 IMPLANT
TRAY FOLEY MTR SLVR 16FR STAT (SET/KITS/TRAYS/PACK) IMPLANT
WATER STERILE IRR 1000ML POUR (IV SOLUTION) ×4 IMPLANT

## 2017-09-13 NOTE — Anesthesia Preprocedure Evaluation (Signed)
Anesthesia Evaluation  Patient identified by MRN, date of birth, ID band Patient awake    Reviewed: Allergy & Precautions, NPO status , Patient's Chart, lab work & pertinent test results  History of Anesthesia Complications Negative for: history of anesthetic complications  Airway Mallampati: II  TM Distance: >3 FB Neck ROM: Full    Dental  (+) Dental Advisory Given   Pulmonary former smoker,    breath sounds clear to auscultation       Cardiovascular hypertension, Pt. on medications (-) angina(-) Past MI and (-) CHF  Rhythm:Regular     Neuro/Psych negative neurological ROS  negative psych ROS   GI/Hepatic negative GI ROS, (+) Hepatitis -, CTreated with Harvani    Endo/Other    Renal/GU Renal disease     Musculoskeletal  (+) Arthritis ,   Abdominal   Peds  Hematology No blood thinners per patient   Anesthesia Other Findings   Reproductive/Obstetrics                             Anesthesia Physical Anesthesia Plan  ASA: II  Anesthesia Plan: MAC and Spinal   Post-op Pain Management:    Induction:   PONV Risk Score and Plan: 1 and Treatment may vary due to age or medical condition  Airway Management Planned: Nasal Cannula  Additional Equipment: None  Intra-op Plan:   Post-operative Plan:   Informed Consent: I have reviewed the patients History and Physical, chart, labs and discussed the procedure including the risks, benefits and alternatives for the proposed anesthesia with the patient or authorized representative who has indicated his/her understanding and acceptance.   Dental advisory given  Plan Discussed with: CRNA and Surgeon  Anesthesia Plan Comments:         Anesthesia Quick Evaluation

## 2017-09-13 NOTE — Evaluation (Signed)
Physical Therapy Evaluation Patient Details Name: Victor Ball MRN: [id number] DOB: 05-28-1940 Today's Date: 09/13/2017   History of Present Illness  Pt is a 77 y/o male s/p elective R THA, direct anterior. PMH includes Hepatitis C, HTN, L THA.   Clinical Impression  Pt is s/p surgery above with deficits below. Pt with increased shakiness, so mobility limited to chair. Required min A for mobility using RW. Educated about supine HEP. Will continue to follow acutely to maximize functional mobility independence and safety.     Follow Up Recommendations Follow surgeon's recommendation for DC plan and follow-up therapies;Supervision for mobility/OOB    Equipment Recommendations  None recommended by PT    Recommendations for Other Services       Precautions / Restrictions Precautions Precautions: None Restrictions Weight Bearing Restrictions: Yes RLE Weight Bearing: Weight bearing as tolerated      Mobility  Bed Mobility Overal bed mobility: Needs Assistance Bed Mobility: Supine to Sit     Supine to sit: Min assist     General bed mobility comments: Min A for RLE assist. Increased time required to perform.   Transfers Overall transfer level: Needs assistance Equipment used: Rolling walker (2 wheeled) Transfers: Sit to/from Stand Sit to Stand: Min assist         General transfer comment: Min A for lift assist and steadying. Verbal cues for safe hand placement.   Ambulation/Gait Ambulation/Gait assistance: Min assist Gait Distance (Feet): 5 Feet Assistive device: Rolling walker (2 wheeled) Gait Pattern/deviations: Step-to pattern;Decreased step length - right;Decreased step length - left;Decreased weight shift to right;Antalgic Gait velocity: Decreased    General Gait Details: Slow, antalgic gait. Pt with increased shakiness, so distance limited to the chair. Verbal cues for sequencing using RW. Min A for steadying.   Stairs            Wheelchair Mobility     Modified Rankin (Stroke Patients Only)       Balance Overall balance assessment: Needs assistance Sitting-balance support: No upper extremity supported;Feet supported Sitting balance-Leahy Scale: Good     Standing balance support: Bilateral upper extremity supported;During functional activity Standing balance-Leahy Scale: Poor Standing balance comment: Reliant on BUE support.                              Pertinent Vitals/Pain Pain Assessment: 0-10 Pain Score: 6  Pain Location: R hip  Pain Descriptors / Indicators: Aching;Operative site guarding Pain Intervention(s): Limited activity within patient's tolerance;Monitored during session;Repositioned    Home Living Family/patient expects to be discharged to:: Private residence Living Arrangements: Spouse/significant other Available Help at Discharge: Family;Available 24 hours/day Type of Home: Apartment Home Access: Stairs to enter Entrance Stairs-Rails: None Entrance Stairs-Number of Steps: 1(threshold step ) Home Layout: One level Home Equipment: Walker - 2 wheels;Adaptive equipment;Shower seat      Prior Function Level of Independence: Independent         Comments: Reports he was very active and going to silver sneakers.     Hand Dominance        Extremity/Trunk Assessment   Upper Extremity Assessment Upper Extremity Assessment: Defer to OT evaluation    Lower Extremity Assessment Lower Extremity Assessment: RLE deficits/detail RLE Deficits / Details: Sensory in tact. Deficits consistent with post op pain and weakness. Able to perform ther ex below.     Cervical / Trunk Assessment Cervical / Trunk Assessment: Normal  Communication   Communication: No difficulties  Cognition Arousal/Alertness: Awake/alert Behavior During Therapy: WFL for tasks assessed/performed Overall Cognitive Status: Within Functional Limits for tasks assessed                                         General Comments General comments (skin integrity, edema, etc.): Pt's wife and daughter present during session.     Exercises Total Joint Exercises Ankle Circles/Pumps: AROM;Both;20 reps Quad Sets: AROM;Right;10 reps Heel Slides: AAROM;Right;10 reps   Assessment/Plan    PT Assessment Patient needs continued PT services  PT Problem List Decreased strength;Decreased range of motion;Decreased balance;Decreased mobility;Decreased knowledge of use of DME;Decreased knowledge of precautions;Pain       PT Treatment Interventions DME instruction;Gait training;Stair training;Functional mobility training;Therapeutic activities;Therapeutic exercise;Balance training;Patient/family education    PT Goals (Current goals can be found in the Care Plan section)  Acute Rehab PT Goals Patient Stated Goal: To get back to doing what I was doing before surgery PT Goal Formulation: With patient Time For Goal Achievement: 09/27/17 Potential to Achieve Goals: Good    Frequency 7X/week   Barriers to discharge        Co-evaluation               AM-PAC PT "6 Clicks" Daily Activity  Outcome Measure Difficulty turning over in bed (including adjusting bedclothes, sheets and blankets)?: A Little Difficulty moving from lying on back to sitting on the side of the bed? : Unable Difficulty sitting down on and standing up from a chair with arms (e.g., wheelchair, bedside commode, etc,.)?: Unable Help needed moving to and from a bed to chair (including a wheelchair)?: A Little Help needed walking in hospital room?: A Little Help needed climbing 3-5 steps with a railing? : A Lot 6 Click Score: 13    End of Session Equipment Utilized During Treatment: Gait belt Activity Tolerance: Patient tolerated treatment well Patient left: in chair;with call bell/phone within reach;with family/visitor present Nurse Communication: Mobility status PT Visit Diagnosis: Unsteadiness on feet (R26.81);Muscle weakness  (generalized) (M62.81);Pain Pain - Right/Left: Right Pain - part of body: Hip    Time: 8811-0315 PT Time Calculation (min) (ACUTE ONLY): 28 min   Charges:   PT Evaluation $PT Eval Low Complexity: 1 Low PT Treatments $Therapeutic Activity: 8-22 mins        Leighton Ruff, PT, DPT  Acute Rehabilitation Services  Pager: [phone]   Rudean Hitt 09/13/2017, 6:23 PM

## 2017-09-13 NOTE — Op Note (Signed)
NAME: Victor Ball, Victor Ball MEDICAL RECORD OI:[id number] ACCOUNT [id number] DATE OF BIRTH:27-Aug-1940 FACILITY: [facility] LOCATION: [facility] PHYSICIAN:CHRISTOPHER Kerry Fort, MD  OPERATIVE REPORT  DATE OF PROCEDURE:  09/13/2017  PREOPERATIVE DIAGNOSIS:  Primary osteoarthritis and degenerative joint disease, right hip.  POSTOPERATIVE DIAGNOSIS:  Primary osteoarthritis and degenerative joint disease, right hip.  PROCEDURE:  Right total hip arthroplasty, direct anterior approach.  IMPLANTS:  DePuy Sector Gription acetabular component size 60, size 36+4 neutral polyethylene liner, size 14 Corail femoral component with varus offset, size 36+1.5 ceramic hip ball.  SURGEON:  Lind Guest. Ninfa Linden, MD  ASSISTANT:  Erskine Emery, PA-C.  ANESTHESIA:  Spinal.  ANTIBIOTICS:  2 grams IV Ancef.  ESTIMATED BLOOD LOSS:  983 mL  COMPLICATIONS:  None.  INDICATIONS:  The patient is a 77 year old very active gentleman well known to me.  He is an avid Firefighter.  I actually performed a left total hip arthroplasty on him in 10/2016.  At that time, his right hip had severe arthritis in it and he wanted  to hold off on surgery until this year.  Now, his pain is daily on the right hip.  His left hip was done well.  His pain on the right side has also detrimentally affected activities of daily living, his quality of life and mobility.  At this point, he  does wish to proceed with total hip arthroplasty on the right side.  He understands fully the significant risks of acute blood loss anemia, nerve or vessel injury, fracture, infection, dislocation, DVT.  He understands our goals are to decrease pain,  improve mobility and overall improve quality of life.  DESCRIPTION OF PROCEDURE:  After informed consent was obtained for right hip was marked.  He was brought to the operating room and sat up on the stretcher.  Spinal anesthesia was obtained.  He was then laid in the supine position on a stretcher.  Foley   catheter was placed and traction boots were placed on both his feet.  Next, he was placed supine on the Hana fracture table with a perineal post in place and both legs in line skeletal traction device and no traction applied.  His right operative hip was  prepped and draped with DuraPrep and sterile drapes.  A time-out was called to identify correct patient, correct right hip.  We then made an incision just inferior and posterior to the anterior superior iliac spine and carried this obliquely down the  leg.  We dissected down tensor fascia lata muscle.  Tensor fascia was then divided longitudinally to proceed with direct anterior approach to the hip.  We identified and cauterized circumflex vessels.  I then identified the hip capsule, opened the hip  capsule in an L-type format, finding moderate joint effusion and significant periarticular osteophytes around the femoral head and neck area.  We placed Cobra retractors around the medial and lateral femoral neck and then made our femoral neck cut with  an oscillating saw and completed this with an osteotome.  We placed a corkscrew guide in the femoral head and removed the femoral head in its entirety and found it to be completely devoid of cartilage.  I then placed a bent Hohmann over the medial  acetabular rim and removed remnants of the acetabular labrum and other debris.  We then began reaming from a size 48 reamer going in stepwise increments up to a size 59 with all reamers under direct visualization, the last reamer under direct  fluoroscopy, so we could see our  depth of reaming our inclination and anteversion.  I then placed the real DePuy Sector Gription acetabular component size 60 and a 36+4 poly liner for that size acetabular component.  Attention was then turned to the  femur.  With the leg externally rotated to 120 degrees, extended and adducted, I was able to place a Mueller retractor medially and a Hohmann retractor above the greater trochanter.   We released lateral joint capsule and used a box-cutting osteotome to  enter femoral canal and a rongeur to lateralize.  We then began broaching from a size 8 broach using the Corail broaching system going up to a size 14.  With a 14 in place we trialed the standard offset femoral neck and 36+1.5 hip ball reducing the  acetabulum.  We felt like we were stable and his leg lengths were near equal.  Of note, preoperatively leg lengths looked longer, but he is not, preoperatively he is actually shorter on that side.  We then dislocated the hip and removed the trial  components.  We placed the real Corail femoral component with varus offset size 14 and the real 36+1.5 ceramic hip ball and again reduced this in the acetabulum and he was stable.  We were pleased with mobility and the offset and leg length.  We then  irrigated the soft tissue with normal saline solution using pulsatile lavage.  We closed the joint capsule with interrupted #1 Ethibond suture, followed by running #1 Vicryl in tensor fascia, 0 Vicryl in the deep tissue, 2-0 Vicryl subcutaneous tissue  and interrupted staples on the skin.  Xeroform and Aquacel dressing was applied.  He was taken off the Hana table and taken to recovery room in stable condition.  All final counts were correct.  There were no complications noted.  Of note, Benita Stabile,  PA-C, assisted the entire case.  His assistance was crucial for facilitating all aspects of this case.  TN/NUANCE  D:09/13/2017 T:09/13/2017 JOB:[id number]

## 2017-09-13 NOTE — Anesthesia Procedure Notes (Signed)
Spinal  Patient location during procedure: OR Start time: 09/13/2017 12:31 PM End time: 09/13/2017 12:34 PM Staffing Anesthesiologist: Oleta Mouse, MD Preanesthetic Checklist Completed: patient identified, surgical consent, pre-op evaluation, timeout performed, IV checked, risks and benefits discussed and monitors and equipment checked Spinal Block Patient position: sitting Prep: site prepped and draped and DuraPrep Patient monitoring: heart rate, cardiac monitor, continuous pulse ox and blood pressure Approach: midline Location: L4-5 Injection technique: single-shot Needle Needle type: Pencan  Needle gauge: 24 G Needle length: 10 cm Assessment Sensory level: T6

## 2017-09-13 NOTE — Transfer of Care (Signed)
Immediate Anesthesia Transfer of Care Note  Patient: Victor Ball  Procedure(s) Performed: RIGHT TOTAL HIP ARTHROPLASTY ANTERIOR APPROACH (Right Hip)  Patient Location: PACU  Anesthesia Type:MAC and Spinal  Level of Consciousness: awake and alert   Airway & Oxygen Therapy: Patient Spontanous Breathing and Patient connected to nasal cannula oxygen  Post-op Assessment: Report given to RN and Post -op Vital signs reviewed and stable  Post vital signs: Reviewed and stable  Last Vitals:  Vitals Value Taken Time  BP 117/69 09/13/2017  2:10 PM  Temp    Pulse 52 09/13/2017  2:19 PM  Resp 12 09/13/2017  2:19 PM  SpO2 100 % 09/13/2017  2:19 PM  Vitals shown include unvalidated device data.  Last Pain:  Vitals:   09/13/17 1050  TempSrc: Oral  PainSc:          Complications: No apparent anesthesia complications

## 2017-09-13 NOTE — H&P (Signed)
TOTAL HIP ADMISSION H&P  Patient is admitted for right total hip arthroplasty.  Subjective:  Chief Complaint: right hip pain  HPI: Victor Ball, 77 y.o. male, has a history of pain and functional disability in the right hip(s) due to arthritis and patient has failed non-surgical conservative treatments for greater than 12 weeks to include NSAID's and/or analgesics, corticosteriod injections, flexibility and strengthening excercises, use of assistive devices, weight reduction as appropriate and activity modification.  Onset of symptoms was gradual starting 1 years ago with gradually worsening course since that time.The patient noted no past surgery on the right hip(s).  Patient currently rates pain in the right hip at 10 out of 10 with activity. Patient has night pain, worsening of pain with activity and weight bearing, pain that interfers with activities of daily living and pain with passive range of motion. Patient has evidence of subchondral sclerosis, periarticular osteophytes and joint space narrowing by imaging studies. This condition presents safety issues increasing the risk of falls.  There is no current active infection.  Patient Active Problem List   Diagnosis Date Noted  . Essential hypertension 08/10/2017  . Preoperative cardiovascular examination 08/10/2017  . Unilateral primary osteoarthritis, right hip 07/13/2017  . LFT elevation 02/22/2017  . Status post total replacement of left hip 11/09/2016  . Pain of left hip joint 10/18/2016  . Unilateral primary osteoarthritis, left hip 10/18/2016  . Lumbar spondylosis 07/19/2016  . Healthcare maintenance 02/19/2016  . ED (erectile dysfunction) 02/19/2016  . Rash 02/19/2016  . Hyperglycemia 02/19/2016  . Advance care planning 01/06/2014  . Osteoarthritis, hip, bilateral 10/31/2012  . Medicare annual wellness visit, subsequent 10/24/2012  . Left hip pain 10/04/2012  . Elevated PSA 10/06/2011  . Hyperlipidemia   . Kidney stone   .  Hepatitis C, chronic (HCC)    Past Medical History:  Diagnosis Date  . Hepatitis C, chronic ([place])    treated prev with Harvoni, tested and cleared  . History of kidney stones   . Hyperlipidemia   . Hypertension     Past Surgical History:  Procedure Laterality Date  . APPENDECTOMY  1948  . JOINT REPLACEMENT    . LIVER BIOPSY    . PROSTATE BIOPSY     neg, x2  . TOE FUSION  1985   left great toe  . TONSILLECTOMY AND ADENOIDECTOMY  1945  . TOTAL HIP ARTHROPLASTY Left 11/09/2016   Procedure: LEFT TOTAL HIP ARTHROPLASTY ANTERIOR APPROACH;  Surgeon: Mcarthur Rossetti, MD;  Location: [facility];  Service: Orthopedics;  Laterality: Left;  Marland Kitchen VASECTOMY      Current Facility-Administered Medications  Medication Dose Route Frequency Provider Last Rate Last Dose  . ceFAZolin (ANCEF) IVPB 2g/100 mL premix  2 g Intravenous To SS-Surg Mcarthur Rossetti, MD      . chlorhexidine (HIBICLENS) 4 % liquid 4 application  60 mL Topical Once Erskine Emery W, PA-C      . lactated ringers infusion   Intravenous Continuous Oleta Mouse, MD 10 mL/hr at 09/13/17 1050    . tranexamic acid (CYKLOKAPRON) 1,000 mg in sodium chloride 0.9 % 100 mL IVPB  1,000 mg Intravenous To OR Mcarthur Rossetti, MD       Facility-Administered Medications Ordered in Other Encounters  Medication Dose Route Frequency Provider Last Rate Last Dose  . lactated ringers infusion    Continuous PRN Eligha Bridegroom, CRNA       No Known Allergies  Social History   Tobacco Use  . Smoking  status: Former Smoker    Last attempt to quit: 01/18/1973    Years since quitting: 44.6  . Smokeless tobacco: Never Used  Substance Use Topics  . Alcohol use: Yes    Alcohol/week: 7.0 standard drinks    Types: 4 Glasses of wine, 3 Standard drinks or equivalent per week    Comment: 1 drink a day - either wine or cocktail    Family History  Problem Relation Age of Onset  . Heart disease Father   . Diabetes Father   . Heart  attack Father   . Celiac disease Sister   . Heart disease Brother        MI then stents  . Heart attack Brother   . Stroke Brother   . Cancer Paternal Aunt   . Prostate cancer Neg Hx   . Colon cancer Neg Hx      Review of Systems  Musculoskeletal: Positive for joint pain.  All other systems reviewed and are negative.   Objective:  Physical Exam  Constitutional: He is oriented to person, place, and time. He appears well-developed and well-nourished.  HENT:  Head: Normocephalic and atraumatic.  Eyes: Pupils are equal, round, and reactive to light. EOM are normal.  Neck: Normal range of motion. Neck supple.  Cardiovascular: Normal rate and regular rhythm.  Respiratory: Effort normal and breath sounds normal.  GI: Soft. Bowel sounds are normal.  Musculoskeletal:       Right hip: He exhibits decreased range of motion, decreased strength, tenderness and bony tenderness.  Neurological: He is alert and oriented to person, place, and time.  Skin: Skin is warm and dry.  Psychiatric: He has a normal mood and affect.    Vital signs in last 24 hours: Temp:  [97.6 F (36.4 C)] 97.6 F (36.4 C) (08/27 1050) Pulse Rate:  [60] 60 (08/27 1050) Resp:  [20] 20 (08/27 1050) BP: (162-187)/(85-88) 162/88 (08/27 1103) SpO2:  [97 %] 97 % (08/27 1050)  Labs:   Estimated body mass index is 29.79 kg/m as calculated from the following:   Height as of 09/02/17: 6\' 2"  (1.88 m).   Weight as of 09/02/17: 105.2 kg.   Imaging Review Plain radiographs demonstrate severe degenerative joint disease of the right hip(s). The bone quality appears to be excellent for age and reported activity level.    Preoperative templating of the joint replacement has been completed, documented, and submitted to the Operating Room personnel in order to optimize intra-operative equipment management.     Assessment/Plan:  End stage arthritis, right hip(s)  The patient history, physical examination, clinical  judgement of the provider and imaging studies are consistent with end stage degenerative joint disease of the right hip(s) and total hip arthroplasty is deemed medically necessary. The treatment options including medical management, injection therapy, arthroscopy and arthroplasty were discussed at length. The risks and benefits of total hip arthroplasty were presented and reviewed. The risks due to aseptic loosening, infection, stiffness, dislocation/subluxation,  thromboembolic complications and other imponderables were discussed.  The patient acknowledged the explanation, agreed to proceed with the plan and consent was signed. Patient is being admitted for inpatient treatment for surgery, pain control, PT, OT, prophylactic antibiotics, VTE prophylaxis, progressive ambulation and ADL's and discharge planning.The patient is planning to be discharged home with home health services

## 2017-09-13 NOTE — Plan of Care (Signed)
  Problem: Education: Goal: Knowledge of General Education information will improve Description Including pain rating scale, medication(s)/side effects and non-pharmacologic comfort measures Outcome: Progressing   Problem: Clinical Measurements: Goal: Ability to maintain clinical measurements within normal limits will improve Outcome: Progressing   Problem: Activity: Goal: Risk for activity intolerance will decrease Outcome: Progressing   Problem: Elimination: Goal: Will not experience complications related to bowel motility Outcome: Progressing   Problem: Pain Managment: Goal: General experience of comfort will improve Outcome: Progressing   Problem: Safety: Goal: Ability to remain free from injury will improve Outcome: Progressing   

## 2017-09-13 NOTE — Anesthesia Procedure Notes (Signed)
Procedure Name: MAC Date/Time: 09/13/2017 12:29 PM Performed by: Eligha Bridegroom, CRNA Pre-anesthesia Checklist: Patient identified, Emergency Drugs available, Suction available and Patient being monitored Patient Re-evaluated:Patient Re-evaluated prior to induction Oxygen Delivery Method: Nasal cannula Preoxygenation: Pre-oxygenation with 100% oxygen

## 2017-09-13 NOTE — Brief Op Note (Signed)
09/13/2017  1:59 PM  PATIENT:  Victor Ball  77 y.o. male  PRE-OPERATIVE DIAGNOSIS:  osteoarthritis right hip  POST-OPERATIVE DIAGNOSIS:  osteoarthritis right hip  PROCEDURE:  Procedure(s): RIGHT TOTAL HIP ARTHROPLASTY ANTERIOR APPROACH (Right)  SURGEON:  Surgeon(s) and Role:    Mcarthur Rossetti, MD - Primary  PHYSICIAN ASSISTANT: Benita Stabile, PA-C  ANESTHESIA:   spinal  EBL:  200 mL   COUNTS:  YES  DICTATION: .Other Dictation: Dictation Number [id number]  PLAN OF CARE: Admit to inpatient   PATIENT DISPOSITION:  PACU - hemodynamically stable.   Delay start of Pharmacological VTE agent (>24hrs) due to surgical blood loss or risk of bleeding: no

## 2017-09-14 ENCOUNTER — Other Ambulatory Visit: Payer: Self-pay

## 2017-09-14 ENCOUNTER — Encounter (HOSPITAL_COMMUNITY): Payer: Self-pay | Admitting: Orthopaedic Surgery

## 2017-09-14 LAB — CBC
HEMATOCRIT: 40 % (ref 39.0–52.0)
Hemoglobin: 13.5 g/dL (ref 13.0–17.0)
MCH: 31.3 pg (ref 26.0–34.0)
MCHC: 33.8 g/dL (ref 30.0–36.0)
MCV: 92.8 fL (ref 78.0–100.0)
PLATELETS: 156 10*3/uL (ref 150–400)
RBC: 4.31 MIL/uL (ref 4.22–5.81)
RDW: 13.2 % (ref 11.5–15.5)
WBC: 9.8 10*3/uL (ref 4.0–10.5)

## 2017-09-14 LAB — BASIC METABOLIC PANEL
ANION GAP: 8 (ref 5–15)
BUN: 16 mg/dL (ref 8–23)
CALCIUM: 8.2 mg/dL — AB (ref 8.9–10.3)
CO2: 25 mmol/L (ref 22–32)
CREATININE: 1.01 mg/dL (ref 0.61–1.24)
Chloride: 103 mmol/L (ref 98–111)
Glucose, Bld: 151 mg/dL — ABNORMAL HIGH (ref 70–99)
Potassium: 4.2 mmol/L (ref 3.5–5.1)
SODIUM: 136 mmol/L (ref 135–145)

## 2017-09-14 NOTE — Progress Notes (Signed)
Physical Therapy Treatment Patient Details Name: Victor Ball MRN: [id number] DOB: 1940-06-15 Today's Date: 09/14/2017    History of Present Illness Pt is a 77 y/o male s/p elective R THA, direct anterior. PMH includes Hepatitis C, HTN, L THA.     PT Comments    Pt progressing well. Ambulated 75 feet with RW min guard assist. LE exercises performed in recliner. PT to continue per POC.   Follow Up Recommendations  Follow surgeon's recommendation for DC plan and follow-up therapies;Supervision for mobility/OOB     Equipment Recommendations  None recommended by PT    Recommendations for Other Services       Precautions / Restrictions Precautions Precautions: None Restrictions RLE Weight Bearing: Weight bearing as tolerated    Mobility  Bed Mobility Overal bed mobility: Needs Assistance Bed Mobility: Supine to Sit     Supine to sit: Min assist     General bed mobility comments: +rail, cues for sequencing, increased time and effort  Transfers Overall transfer level: Needs assistance Equipment used: Rolling walker (2 wheeled) Transfers: Sit to/from Stand Sit to Stand: Min assist         General transfer comment: cues for hand placement, assist to power up, increased time to stabilize initial standing balance  Ambulation/Gait Ambulation/Gait assistance: Min guard Gait Distance (Feet): 75 Feet Assistive device: Rolling walker (2 wheeled) Gait Pattern/deviations: Step-to pattern;Antalgic;Decreased stride length Gait velocity: Decreased  Gait velocity interpretation: <1.31 ft/sec, indicative of household ambulator General Gait Details: slow, antalgic gait; cues for sequencing and to look up   Stairs             Wheelchair Mobility    Modified Rankin (Stroke Patients Only)       Balance Overall balance assessment: Needs assistance Sitting-balance support: No upper extremity supported;Feet supported Sitting balance-Leahy Scale: Good     Standing  balance support: Bilateral upper extremity supported;During functional activity Standing balance-Leahy Scale: Poor Standing balance comment: Reliant on BUE support.                             Cognition Arousal/Alertness: Awake/alert Behavior During Therapy: WFL for tasks assessed/performed Overall Cognitive Status: Within Functional Limits for tasks assessed                                        Exercises Total Joint Exercises Ankle Circles/Pumps: AROM;Both;20 reps Quad Sets: AROM;Right;10 reps Heel Slides: AAROM;Right;10 reps Straight Leg Raises: AAROM;Right;10 reps    General Comments General comments (skin integrity, edema, etc.): Wife present during session.       Pertinent Vitals/Pain Pain Assessment: 0-10 Pain Score: 5  Pain Location: R hip  Pain Descriptors / Indicators: Operative site guarding;Grimacing;Sore Pain Intervention(s): Monitored during session;Repositioned;Premedicated before session    Home Living                      Prior Function            PT Goals (current goals can now be found in the care plan section) Acute Rehab PT Goals Patient Stated Goal: home tomorrow PT Goal Formulation: With patient Time For Goal Achievement: 09/27/17 Potential to Achieve Goals: Good Progress towards PT goals: Progressing toward goals    Frequency    7X/week      PT Plan Current plan remains appropriate  Co-evaluation              AM-PAC PT "6 Clicks" Daily Activity  Outcome Measure  Difficulty turning over in bed (including adjusting bedclothes, sheets and blankets)?: A Little Difficulty moving from lying on back to sitting on the side of the bed? : Unable Difficulty sitting down on and standing up from a chair with arms (e.g., wheelchair, bedside commode, etc,.)?: Unable Help needed moving to and from a bed to chair (including a wheelchair)?: A Little Help needed walking in hospital room?: A Little Help  needed climbing 3-5 steps with a railing? : A Lot 6 Click Score: 13    End of Session Equipment Utilized During Treatment: Gait belt Activity Tolerance: Patient tolerated treatment well Patient left: in chair;with call bell/phone within reach;with family/visitor present Nurse Communication: Mobility status PT Visit Diagnosis: Unsteadiness on feet (R26.81);Muscle weakness (generalized) (M62.81);Pain Pain - Right/Left: Right Pain - part of body: Hip     Time: 0930-1007 PT Time Calculation (min) (ACUTE ONLY): 37 min  Charges:  $Gait Training: 8-22 mins $Therapeutic Exercise: 8-22 mins                     Lorrin Goodell, PT  Office # [phone] Pager [phone]    [name] 09/14/2017, 10:22 AM

## 2017-09-14 NOTE — Plan of Care (Signed)

## 2017-09-14 NOTE — Plan of Care (Signed)

## 2017-09-14 NOTE — Anesthesia Postprocedure Evaluation (Signed)
Anesthesia Post Note  Patient: Victor Ball  Procedure(s) Performed: RIGHT TOTAL HIP ARTHROPLASTY ANTERIOR APPROACH (Right Hip)     Patient location during evaluation: PACU Anesthesia Type: MAC and Spinal Level of consciousness: awake and alert Pain management: pain level controlled Vital Signs Assessment: post-procedure vital signs reviewed and stable Respiratory status: spontaneous breathing, nonlabored ventilation, respiratory function stable and patient connected to nasal cannula oxygen Cardiovascular status: stable and blood pressure returned to baseline Postop Assessment: no apparent nausea or vomiting and spinal receding Anesthetic complications: no    Last Vitals:  Vitals:   09/14/17 0221 09/14/17 0406  BP: 119/68 (!) 115/58  Pulse: 70 71  Resp: 16 16  Temp: 36.9 C 37.1 C  SpO2:  94%    Last Pain:  Vitals:   09/14/17 0829  TempSrc:   PainSc: 5                  Mattox Schorr

## 2017-09-14 NOTE — Progress Notes (Signed)
Physical Therapy Treatment Patient Details Name: Victor Ball MRN: [id number] DOB: March 13, 1940 Today's Date: 09/14/2017    History of Present Illness Pt is a 77 y/o male s/p elective R THA, direct anterior. PMH includes Hepatitis C, HTN, L THA.     PT Comments    Pt received in recliner and agreeable to participation in therapy. Min assist required for sit to stand from recliner. Pt ambulated 100 feet with RW min guard assist. Pt assisted into bed at end of session. Wife present in room. Pt/wife are anticipating d/c home tomorrow after AM PT session. Pt needs stair training prior to d/c.    Follow Up Recommendations  Follow surgeon's recommendation for DC plan and follow-up therapies;Supervision for mobility/OOB     Equipment Recommendations  None recommended by PT    Recommendations for Other Services       Precautions / Restrictions Precautions Precautions: None Restrictions Weight Bearing Restrictions: Yes RLE Weight Bearing: Weight bearing as tolerated    Mobility  Bed Mobility Overal bed mobility: Needs Assistance Bed Mobility: Sit to Supine     Supine to sit: Min assist Sit to supine: Min assist   General bed mobility comments: +rail, cues for sequencing, assist with RLE  Transfers Overall transfer level: Needs assistance Equipment used: Rolling walker (2 wheeled) Transfers: Sit to/from Stand Sit to Stand: Min assist         General transfer comment: cues for hand placement, assist to power up from recliner, increased time and effort due to lower surface  Ambulation/Gait Ambulation/Gait assistance: Min guard Gait Distance (Feet): 100 Feet Assistive device: Rolling walker (2 wheeled) Gait Pattern/deviations: Antalgic;Step-through pattern;Decreased stride length Gait velocity: Decreased  Gait velocity interpretation: <1.31 ft/sec, indicative of household ambulator General Gait Details: cues for sequencing and posture   Stairs              Wheelchair Mobility    Modified Rankin (Stroke Patients Only)       Balance Overall balance assessment: Needs assistance Sitting-balance support: No upper extremity supported;Feet supported Sitting balance-Leahy Scale: Good     Standing balance support: Bilateral upper extremity supported;During functional activity Standing balance-Leahy Scale: Poor Standing balance comment: Reliant on BUE support.                             Cognition Arousal/Alertness: Awake/alert Behavior During Therapy: WFL for tasks assessed/performed Overall Cognitive Status: Within Functional Limits for tasks assessed                                        Exercises Total Joint Exercises Ankle Circles/Pumps: AROM;Both;20 reps Quad Sets: AROM;Right;10 reps Heel Slides: AAROM;Right;10 reps Straight Leg Raises: AAROM;Right;10 reps    General Comments General comments (skin integrity, edema, etc.): Wife present during session.       Pertinent Vitals/Pain Pain Assessment: 0-10 Pain Score: 3  Pain Location: R hip  Pain Descriptors / Indicators: Operative site guarding;Grimacing;Sore Pain Intervention(s): Monitored during session;Repositioned    Home Living                      Prior Function            PT Goals (current goals can now be found in the care plan section) Acute Rehab PT Goals Patient Stated Goal: home tomorrow PT Goal Formulation: With patient  Time For Goal Achievement: 09/27/17 Potential to Achieve Goals: Good Progress towards PT goals: Progressing toward goals    Frequency    7X/week      PT Plan Current plan remains appropriate    Co-evaluation              AM-PAC PT "6 Clicks" Daily Activity  Outcome Measure  Difficulty turning over in bed (including adjusting bedclothes, sheets and blankets)?: A Little Difficulty moving from lying on back to sitting on the side of the bed? : A Lot Difficulty sitting down on and  standing up from a chair with arms (e.g., wheelchair, bedside commode, etc,.)?: A Lot Help needed moving to and from a bed to chair (including a wheelchair)?: A Little Help needed walking in hospital room?: A Little Help needed climbing 3-5 steps with a railing? : A Lot 6 Click Score: 15    End of Session Equipment Utilized During Treatment: Gait belt Activity Tolerance: Patient tolerated treatment well Patient left: in bed;with call bell/phone within reach;with family/visitor present Nurse Communication: Mobility status PT Visit Diagnosis: Unsteadiness on feet (R26.81);Muscle weakness (generalized) (M62.81);Pain Pain - Right/Left: Right Pain - part of body: Hip     Time: 1255-1315 PT Time Calculation (min) (ACUTE ONLY): 20 min  Charges:  $Gait Training: 8-22 mins $Therapeutic Exercise: 8-22 mins                     Victor Ball, PT  Office # [phone] Pager [phone]    [name] 09/14/2017, 1:34 PM

## 2017-09-14 NOTE — Progress Notes (Signed)
Patient had bloody urine post f/c removal. 50cc of bloody urine noted. Encouraged to push po fluids. Denies pain on urination. Will cont to monitor.

## 2017-09-14 NOTE — Progress Notes (Signed)
Patient voiding w/o difficulty. 400cc of clear yellow urine noted. Denies pain at this time.

## 2017-09-14 NOTE — Evaluation (Signed)
Occupational Therapy Evaluation Patient Details Name: Victor Ball MRN: [id number] DOB: 1940/03/09 Today's Date: 09/14/2017    History of Present Illness Pt is a 77 y/o male s/p elective R THA, direct anterior. PMH includes Hepatitis C, HTN, L THA.    Clinical Impression   PTA patient independent.  He currently requires setup for UB ADL, min assist for LB ADL, min guard for toileting, mod assist for toilet transfers, and min assist for bed mobility.  Patient significantly limited by pain during session in R hip during transfers and mobility, but declines for therapist to notify RN for pain medication as pain decreases during rest. Reporting needing increased assistance than this morning due to "having just taken a nap", voicing "I did better before". Educated on safety, precautions, mobility, and ADL compensatory techniques. He will benefit from continued OT services while admitted in order to maximize safety and independence prior to dc home with his spouse providing assistance as needed.  No follow up OT services required after dc.      Follow Up Recommendations  No OT follow up;Supervision - Intermittent    Equipment Recommendations  None recommended by OT    Recommendations for Other Services       Precautions / Restrictions Precautions Precautions: None Restrictions Weight Bearing Restrictions: Yes RLE Weight Bearing: Weight bearing as tolerated      Mobility Bed Mobility Overal bed mobility: Needs Assistance Bed Mobility: Sit to Supine;Supine to Sit     Supine to sit: Min assist Sit to supine: Min assist   General bed mobility comments: using rails, cueing for sequencing, requires assistance with R LE   Transfers Overall transfer level: Needs assistance Equipment used: Rolling walker (2 wheeled) Transfers: Sit to/from Stand Sit to Stand: Mod assist         General transfer comment: cueing for hand placement, mod assist to power up from EOB due to pain and urinary  urgency     Balance Overall balance assessment: Needs assistance Sitting-balance support: No upper extremity supported;Feet supported Sitting balance-Leahy Scale: Good     Standing balance support: During functional activity;Single extremity supported Standing balance-Leahy Scale: Poor Standing balance comment: Reliant on UE support.                            ADL either performed or assessed with clinical judgement   ADL Overall ADL's : Needs assistance/impaired     Grooming: Set up;Sitting   Upper Body Bathing: Set up;Sitting   Lower Body Bathing: Minimal assistance;Sit to/from stand Lower Body Bathing Details (indicate cue type and reason): educated on safety bathing seated and using long handled sponge, patient report unable to fit chair and will bathe standing with spouses support Upper Body Dressing : Set up;Sitting   Lower Body Dressing: Minimal assistance;With adaptive equipment;Sit to/from stand Lower Body Dressing Details (indicate cue type and reason): requires assistance with R LE clothing, pt is familiar with AE for LB dressing and plans to use at dc or have assistance from spouse  Toilet Transfer: Moderate assistance;Ambulation;RW(simulated in room ) Toilet Transfer Details (indicate cue type and reason): limited by pain in R hip with mobility, increased assist to ascend  Toileting- Water quality scientist and Hygiene: Min guard;Sit to/from stand Toileting - Clothing Manipulation Details (indicate cue type and reason): standing using urinal with min gaurd      Functional mobility during ADLs: Min guard;Rolling walker General ADL Comments: signficantly limited by pain with transfers  and mobility this afternoon, but once resting denies pain and need for pain medication     Vision   Vision Assessment?: No apparent visual deficits     Perception     Praxis      Pertinent Vitals/Pain Pain Assessment: Faces Pain Score: 3  Faces Pain Scale: Hurts even  more(with movement, mobility) Pain Location: R hip  Pain Descriptors / Indicators: Operative site guarding;Grimacing;Sore Pain Intervention(s): Limited activity within patient's tolerance;Repositioned     Hand Dominance     Extremity/Trunk Assessment Upper Extremity Assessment Upper Extremity Assessment: Overall WFL for tasks assessed   Lower Extremity Assessment Lower Extremity Assessment: Defer to PT evaluation RLE Deficits / Details: s/p R hip sx   Cervical / Trunk Assessment Cervical / Trunk Assessment: Normal   Communication Communication Communication: No difficulties   Cognition Arousal/Alertness: Awake/alert Behavior During Therapy: WFL for tasks assessed/performed Overall Cognitive Status: Within Functional Limits for tasks assessed                                     General Comments  wife present during session    Exercises     Shoulder Instructions      Home Living Family/patient expects to be discharged to:: Private residence Living Arrangements: Spouse/significant other Available Help at Discharge: Family;Available 24 hours/day Type of Home: Apartment Home Access: Stairs to enter Entrance Stairs-Number of Steps: 1(threshold step) Entrance Stairs-Rails: None Home Layout: One level     Bathroom Shower/Tub: Occupational psychologist: Handicapped height     Home Equipment: Environmental consultant - 2 wheels;Adaptive equipment;Shower seat Adaptive Equipment: Reacher;Long-handled shoe horn;Sock aid        Prior Functioning/Environment Level of Independence: Independent        Comments: independent ADLs and mobility, very active, attends silver sneakers        OT Problem List: Decreased activity tolerance;Impaired balance (sitting and/or standing);Decreased knowledge of precautions;Decreased knowledge of use of DME or AE;Pain;Decreased safety awareness      OT Treatment/Interventions: Self-care/ADL training;Therapeutic  activities;Balance training;Patient/family education;Energy conservation;DME and/or AE instruction    OT Goals(Current goals can be found in the care plan section) Acute Rehab OT Goals Patient Stated Goal: home tomorrow OT Goal Formulation: With patient/family Time For Goal Achievement: 09/28/17 Potential to Achieve Goals: Good  OT Frequency: Min 2X/week   Barriers to D/C:            Co-evaluation              AM-PAC PT "6 Clicks" Daily Activity     Outcome Measure Help from another person eating meals?: None Help from another person taking care of personal grooming?: A Little Help from another person toileting, which includes using toliet, bedpan, or urinal?: A Little Help from another person bathing (including washing, rinsing, drying)?: A Little Help from another person to put on and taking off regular upper body clothing?: None Help from another person to put on and taking off regular lower body clothing?: A Little 6 Click Score: 20   End of Session Equipment Utilized During Treatment: Gait belt;Rolling walker Nurse Communication: Mobility status  Activity Tolerance: Patient tolerated treatment well Patient left: in bed;with call bell/phone within reach;with family/visitor present  OT Visit Diagnosis: Other abnormalities of gait and mobility (R26.89);Pain Pain - Right/Left: Right Pain - part of body: Hip  Time: 1117-3567 OT Time Calculation (min): 31 min Charges:  OT General Charges $OT Visit: 1 Visit OT Evaluation $OT Eval Moderate Complexity: 1 Mod OT Treatments $Self Care/Home Management : 8-22 mins  Delight Stare, OTR/L  Pager [place] 09/14/2017, 4:20 PM

## 2017-09-14 NOTE — Progress Notes (Signed)
Subjective: 1 Day Post-Op Procedure(s) (LRB): RIGHT TOTAL HIP ARTHROPLASTY ANTERIOR APPROACH (Right) Patient reports pain as moderate.    Objective: Vital signs in last 24 hours: Temp:  [97.3 F (36.3 C)-98.8 F (37.1 C)] 98.8 F (37.1 C) (08/28 0406) Pulse Rate:  [50-71] 71 (08/28 0406) Resp:  [11-20] 16 (08/28 0406) BP: (115-187)/(58-88) 115/58 (08/28 0406) SpO2:  [94 %-100 %] 94 % (08/28 0406) Weight:  [105.2 kg] 105.2 kg (08/28 0221)  Intake/Output from previous day: 08/27 0701 - 08/28 0700 In: 2857.7 [I.V.:2757.7; IV Piggyback:100] Out: 1115 [Urine:915; Blood:200] Intake/Output this shift: No intake/output data recorded.  Recent Labs    09/14/17 0401  HGB 13.5   Recent Labs    09/14/17 0401  WBC 9.8  RBC 4.31  HCT 40.0  PLT 156   Recent Labs    09/14/17 0401  NA 136  K 4.2  CL 103  CO2 25  BUN 16  CREATININE 1.01  GLUCOSE 151*  CALCIUM 8.2*   No results for input(s): LABPT, INR in the last 72 hours.  Sensation intact distally Intact pulses distally Dorsiflexion/Plantar flexion intact Incision: scant drainage    Assessment/Plan: 1 Day Post-Op Procedure(s) (LRB): RIGHT TOTAL HIP ARTHROPLASTY ANTERIOR APPROACH (Right) Up with therapy Discharge home with home health next 1-2 days.    Mcarthur Rossetti 09/14/2017, 7:28 AM

## 2017-09-15 MED ORDER — METHOCARBAMOL 500 MG PO TABS: 500.0000 mg | ORAL_TABLET | Freq: Four times a day (QID) | ORAL | 1 refills | Status: DC | PRN

## 2017-09-15 MED ORDER — ASPIRIN 81 MG PO CHEW: 81.0000 mg | CHEWABLE_TABLET | Freq: Two times a day (BID) | ORAL | 0 refills | Status: DC

## 2017-09-15 NOTE — Discharge Summary (Signed)
Patient ID: Victor Ball MRN: [id number] DOB/AGE: 1940-12-03 77 y.o.  Admit date: 09/13/2017 Discharge date: 09/15/2017  Admission Diagnoses:  Principal Problem:   Unilateral primary osteoarthritis, right hip Active Problems:   Status post total replacement of right hip   Discharge Diagnoses:  Same  Past Medical History:  Diagnosis Date  . Hepatitis C, chronic ([place])    treated prev with Harvoni, tested and cleared  . History of kidney stones   . Hyperlipidemia   . Hypertension     Surgeries: Procedure(s): RIGHT TOTAL HIP ARTHROPLASTY ANTERIOR APPROACH on 09/13/2017   Consultants:   Discharged Condition: Improved  Hospital Course: KAINON VARADY is an 77 y.o. male who was admitted 09/13/2017 for operative treatment ofUnilateral primary osteoarthritis, right hip. Patient has severe unremitting pain that affects sleep, daily activities, and work/hobbies. After pre-op clearance the patient was taken to the operating room on 09/13/2017 and underwent  Procedure(s): RIGHT TOTAL HIP ARTHROPLASTY ANTERIOR APPROACH.    Patient was given perioperative antibiotics:  Anti-infectives (From admission, onward)   Start     Dose/Rate Route Frequency Ordered Stop   09/13/17 1800  ceFAZolin (ANCEF) IVPB 1 g/50 mL premix     1 g 100 mL/hr over 30 Minutes Intravenous Every 6 hours 09/13/17 1617 09/14/17 0034   09/13/17 1215  ceFAZolin (ANCEF) IVPB 2g/100 mL premix     2 g 200 mL/hr over 30 Minutes Intravenous To [facility] 09/12/17 1009 09/13/17 1230       Patient was given sequential compression devices, early ambulation, and chemoprophylaxis to prevent DVT.  Patient benefited maximally from hospital stay and there were no complications.    Recent vital signs:  Patient Vitals for the past 24 hrs:  BP Temp Temp src Pulse Resp SpO2  09/15/17 0500 (!) 144/77 98.6 F (37 C) Oral 82 17 100 %  09/14/17 2036 (!) 163/73 99.3 F (37.4 C) Oral 89 18 95 %  09/14/17 1435 - 98.2 F (36.8 C)  Oral 83 17 98 %     Recent laboratory studies:  Recent Labs    09/14/17 0401  WBC 9.8  HGB 13.5  HCT 40.0  PLT 156  NA 136  K 4.2  CL 103  CO2 25  BUN 16  CREATININE 1.01  GLUCOSE 151*  CALCIUM 8.2*     Discharge Medications:   Allergies as of 09/15/2017   No Known Allergies     Medication List    STOP taking these medications   aspirin 81 MG tablet Replaced by:  aspirin 81 MG chewable tablet     TAKE these medications   aspirin 81 MG chewable tablet Chew 1 tablet (81 mg total) by mouth 2 (two) times daily. Replaces:  aspirin 81 MG tablet   atorvastatin 10 MG tablet Commonly known as:  LIPITOR Take 1 tablet (10 mg total) by mouth daily.   EQL OMEGA 3 FISH OIL 1400 MG Caps Take 1,400 mg by mouth daily.   lisinopril 5 MG tablet Commonly known as:  PRINIVIL,ZESTRIL Take 1 tablet (5 mg total) by mouth daily.   MATURE ADULT CENTURY PO Take 1 tablet by mouth daily.   methocarbamol 500 MG tablet Commonly known as:  ROBAXIN Take 1 tablet (500 mg total) by mouth every 6 (six) hours as needed for muscle spasms.   PUMPKIN SEED PO Take 262 mg by mouth daily.   TART CHERRY ADVANCED PO Take 3,000 mg by mouth daily.   Turmeric Curcumin 500 MG Caps Take  500 mg by mouth daily.            Durable Medical Equipment  (From admission, onward)         Start     Ordered   09/13/17 1618  DME 3 n 1  Once     09/13/17 1617   09/13/17 1618  DME Walker rolling  Once    Question:  Patient needs a walker to treat with the following condition  Answer:  Status post total replacement of right hip   09/13/17 1617          Diagnostic Studies: Dg Pelvis Portable  Result Date: 09/13/2017 CLINICAL DATA:  77 year old male status post right hip arthroplasty. EXAM: PORTABLE PELVIS 1-2 VIEWS COMPARISON:  Intraoperative images 13 42 hours today. Pelvis series 07/13/2017. FINDINGS: Portable AP supine view at 1443 hours. Preexisting left hip arthroplasty. New bipolar type  right hip arthroplasty. Hardware appears intact and normally aligned on the AP view. Regional postoperative soft tissue changes including skin staples. No unexpected osseous changes identified. IMPRESSION: New right hip arthroplasty with no adverse features. Electronically Signed   By: Genevie Ann M.D.   On: 09/13/2017 15:01   Dg C-arm 1-60 Min  Result Date: 09/13/2017 Fluoroscopy was utilized by the requesting physician.  No radiographic interpretation.   Dg Hip Operative Unilat W Or W/o Pelvis Right  Result Date: 09/13/2017 CLINICAL DATA:  Status post anterior approach right total hip arthroplasty. EXAM: OPERATIVE right HIP (WITH PELVIS IF PERFORMED) 6 VIEWS TECHNIQUE: Fluoroscopic spot image(s) were submitted for interpretation post-operatively. Reported fluoro time is 41 seconds. COMPARISON:  Limited views of the pelvis and both hips from a study of July 13, 2017 FINDINGS: The images reveal a pre-existing left hip prosthesis. On the right the images demonstrate the removal of the native femoral head and neck and placement of the prosthetic components. Radiographic positioning of the prosthetic components is good. IMPRESSION: Intraoperative fluoro spot series revealing anterior approach right total hip joint prosthesis placement. There is no immediate postprocedure complication. Electronically Signed   By: David  Martinique M.D.   On: 09/13/2017 14:08    Disposition:     Follow-up Information    Mcarthur Rossetti, MD. Schedule an appointment as soon as possible for a visit in 2 week(s).   Specialty:  Orthopedic Surgery Contact information: [place] [address] [phone]            Signed: Erskine Emery 09/15/2017, 9:03 AM

## 2017-09-15 NOTE — Discharge Instructions (Signed)

## 2017-09-15 NOTE — Plan of Care (Signed)
Problem: Education: Goal: Knowledge of General Education information will improve Description Including pain rating scale, medication(s)/side effects and non-pharmacologic comfort measures Outcome: Progressing   Problem: Health Behavior/Discharge Planning: Goal: Ability to manage health-related needs will improve Outcome: Progressing   Problem: Clinical Measurements: Goal: Will remain free from infection Outcome: Progressing Goal: Respiratory complications will improve Outcome: Progressing   Problem: Coping: Goal: Level of anxiety will decrease Outcome: Progressing   Problem: Elimination: Goal: Will not experience complications related to urinary retention Outcome: Progressing   Problem: Pain Managment: Goal: General experience of comfort will improve Outcome: Progressing   Problem: Safety: Goal: Ability to remain free from injury will improve Outcome: Progressing

## 2017-09-15 NOTE — Progress Notes (Signed)
AVS given and reviewed with pt and pt's wife. All questions answered to satisfaction. Pt to be escorted off the unit via wheelchair by staff member.

## 2017-09-15 NOTE — Progress Notes (Signed)
Subjective: 2 Days Post-Op Procedure(s) (LRB): RIGHT TOTAL HIP ARTHROPLASTY ANTERIOR APPROACH (Right) Patient reports pain as mild.   No complaints ready to discharge home after PT this morning.  Objective: Vital signs in last 24 hours: Temp:  [98.2 F (36.8 C)-99.3 F (37.4 C)] 98.6 F (37 C) (08/29 0500) Pulse Rate:  [82-89] 82 (08/29 0500) Resp:  [17-18] 17 (08/29 0500) BP: (144-163)/(73-77) 144/77 (08/29 0500) SpO2:  [95 %-100 %] 100 % (08/29 0500)  Intake/Output from previous day: 08/28 0701 - 08/29 0700 In: 1080 [P.O.:1080] Out: 2480 [Urine:2480] Intake/Output this shift: Total I/O In: 240 [P.O.:240] Out: -   Recent Labs    09/14/17 0401  HGB 13.5   Recent Labs    09/14/17 0401  WBC 9.8  RBC 4.31  HCT 40.0  PLT 156   Recent Labs    09/14/17 0401  NA 136  K 4.2  CL 103  CO2 25  BUN 16  CREATININE 1.01  GLUCOSE 151*  CALCIUM 8.2*   No results for input(s): LABPT, INR in the last 72 hours.  Right lower extremity:  Sensation intact distally Intact pulses distally Dorsiflexion/Plantar flexion intact Incision: scant drainage Compartment soft    Assessment/Plan: 2 Days Post-Op Procedure(s) (LRB): RIGHT TOTAL HIP ARTHROPLASTY ANTERIOR APPROACH (Right) Discharge home with home health  Has pain meds at home states no need for refill at this time.     GILBERT CLARK 09/15/2017, 8:45 AM

## 2017-09-15 NOTE — Progress Notes (Signed)
Physical Therapy Treatment Patient Details Name: Victor Ball MRN: [id number] DOB: 1940-03-28 Today's Date: 09/15/2017    History of Present Illness Pt is a 77 y/o male s/p elective R THA, direct anterior. PMH includes Hepatitis C, HTN, L THA.     PT Comments    Pt is progressing well; he and wife feel prepared to d/c today; reviewed HEP, gait and stairs; pt requires assist only with bed mobility and wife is able to provide assist; pt is asking about HHPT, defer to CM  Follow Up Recommendations        Equipment Recommendations  None recommended by PT    Recommendations for Other Services       Precautions / Restrictions Precautions Precautions: None Restrictions Weight Bearing Restrictions: No RLE Weight Bearing: Weight bearing as tolerated    Mobility  Bed Mobility Overal bed mobility: Needs Assistance Bed Mobility: Supine to Sit     Supine to sit: Min assist     General bed mobility comments: wife assists LEs using good technique  Transfers Overall transfer level: Needs assistance Equipment used: Rolling walker (2 wheeled) Transfers: Sit to/from Stand Sit to Stand: Supervision         General transfer comment: for safety, cues for hand placement  Ambulation/Gait Ambulation/Gait assistance: Supervision Gait Distance (Feet): 240 Feet Assistive device: Rolling walker (2 wheeled) Gait Pattern/deviations: Step-through pattern;Decreased stride length     General Gait Details: cues for sequencing and posture   Stairs Stairs: Yes Stairs assistance: Min guard;Supervision Stair Management: Two rails;Forwards;Step to pattern Number of Stairs: 3 General stair comments: cues for sequence   Wheelchair Mobility    Modified Rankin (Stroke Patients Only)       Balance                                            Cognition Arousal/Alertness: Awake/alert Behavior During Therapy: WFL for tasks assessed/performed Overall Cognitive Status:  Within Functional Limits for tasks assessed                                        Exercises      General Comments        Pertinent Vitals/Pain Pain Assessment: No/denies pain Pain Intervention(s): Monitored during session    Home Living                      Prior Function            PT Goals (current goals can now be found in the care plan section) Acute Rehab PT Goals Patient Stated Goal: home today PT Goal Formulation: With patient Time For Goal Achievement: 09/27/17 Potential to Achieve Goals: Good Progress towards PT goals: Progressing toward goals    Frequency    7X/week      PT Plan Current plan remains appropriate    Co-evaluation              AM-PAC PT "6 Clicks" Daily Activity  Outcome Measure  Difficulty turning over in bed (including adjusting bedclothes, sheets and blankets)?: A Little Difficulty moving from lying on back to sitting on the side of the bed? : Unable Difficulty sitting down on and standing up from a chair with arms (e.g., wheelchair, bedside commode, etc,.)?: A Little  Help needed moving to and from a bed to chair (including a wheelchair)?: A Little Help needed walking in hospital room?: A Little Help needed climbing 3-5 steps with a railing? : A Little 6 Click Score: 16    End of Session Equipment Utilized During Treatment: Gait belt Activity Tolerance: Patient tolerated treatment well Patient left: in bed;with call bell/phone within reach;with family/visitor present Nurse Communication: Mobility status PT Visit Diagnosis: Unsteadiness on feet (R26.81);Muscle weakness (generalized) (M62.81)     Time: 9379-0240 PT Time Calculation (min) (ACUTE ONLY): 25 min  Charges:  $Gait Training: 23-37 mins                     Kenyon Ana, [place] Pager: [phone] 09/15/2017    Kenyon Ana 09/15/2017, 1:22 PM

## 2017-09-15 NOTE — Progress Notes (Addendum)
Occupational Therapy Treatment Patient Details Name: Victor Ball MRN: [id number] DOB: [birth year] Today's Date: 09/15/2017    History of present illness Pt is a 77 y/o male s/p elective R THA, direct anterior. PMH includes Hepatitis C, HTN, L THA.    OT comments  Pt is progressing well.  Demonstrates ability to complete toilet transfers with min assist today and simulated shower transfers (stepping over threshold) with min guard assist using RW. Good recall of technique with shower transfers, agreeable to have assistance from spouse for ADLs initially. Continued educated on safety, precautions, energy conservation and recommendations. Plan is to dc home today with spouse, anticipate no further OT needs.  Will continue to follow.    Follow Up Recommendations  No OT follow up;Supervision - Intermittent    Equipment Recommendations  None recommended by OT    Recommendations for Other Services      Precautions / Restrictions Restrictions Weight Bearing Restrictions: Yes RLE Weight Bearing: Weight bearing as tolerated       Mobility Bed Mobility               General bed mobility comments: seated OOB in recliner upon entry  Transfers Overall transfer level: Needs assistance Equipment used: Rolling walker (2 wheeled) Transfers: Sit to/from Stand Sit to Stand: Min assist         General transfer comment: increased time and effort, min assist for safety    Balance Overall balance assessment: Needs assistance Sitting-balance support: No upper extremity supported;Feet supported Sitting balance-Leahy Scale: Good     Standing balance support: During functional activity;No upper extremity supported Standing balance-Leahy Scale: Poor Standing balance comment: static standing with min guard 0 hand support, preference to B UE support of RW                           ADL either performed or assessed with clinical judgement   ADL Overall ADL's : Needs  assistance/impaired                         Toilet Transfer: Minimal assistance;Ambulation;RW(simulated to recliner ) Toilet Transfer Details (indicate cue type and reason): increased time and effort, min assist for safety using RW  Toileting- Clothing Manipulation and Hygiene: Min guard;Sit to/from stand   Tub/ Shower Transfer: Walk-in shower;Min guard;Cueing for safety;Cueing for sequencing;Ambulation;Rolling walker Tub/Shower Transfer Details (indicate cue type and reason): educated on techniques for safe transfser with RW, completed forward step into shower using RW today with min guard; good recall of technique Functional mobility during ADLs: Min guard;Rolling walker General ADL Comments: increased time and effort for all tasks      Vision   Vision Assessment?: No apparent visual deficits   Perception     Praxis      Cognition Arousal/Alertness: Awake/alert Behavior During Therapy: WFL for tasks assessed/performed Overall Cognitive Status: Within Functional Limits for tasks assessed                                          Exercises     Shoulder Instructions       General Comments      Pertinent Vitals/ Pain       Pain Assessment: Faces Faces Pain Scale: Hurts little more(with movement) Pain Location: R hip  Pain Descriptors / Indicators: Operative site guarding;Grimacing;Sore;Discomfort Pain  Intervention(s): Monitored during session;Limited activity within patient's tolerance;Repositioned  Home Living                                          Prior Functioning/Environment              Frequency  Min 2X/week        Progress Toward Goals  OT Goals(current goals can now be found in the care plan section)  Progress towards OT goals: Progressing toward goals  Acute Rehab OT Goals Patient Stated Goal: home today OT Goal Formulation: With patient Time For Goal Achievement: 09/28/17 Potential to Achieve  Goals: Good  Plan Discharge plan remains appropriate;Frequency remains appropriate    Co-evaluation                 AM-PAC PT "6 Clicks" Daily Activity     Outcome Measure   Help from another person eating meals?: None Help from another person taking care of personal grooming?: None Help from another person toileting, which includes using toliet, bedpan, or urinal?: A Little Help from another person bathing (including washing, rinsing, drying)?: A Little Help from another person to put on and taking off regular upper body clothing?: None Help from another person to put on and taking off regular lower body clothing?: A Little 6 Click Score: 21    End of Session Equipment Utilized During Treatment: Rolling walker  OT Visit Diagnosis: Other abnormalities of gait and mobility (R26.89);Pain Pain - Right/Left: Right Pain - part of body: Hip   Activity Tolerance Patient tolerated treatment well   Patient Left in chair;with call bell/phone within reach   Nurse Communication          Time: 6803-2122 OT Time Calculation (min): 14 min  Charges: OT General Charges $OT Visit: 1 Visit OT Treatments $Self Care/Home Management : 8-22 mins  Delight Stare, OTR/L  Pager [place] 09/15/2017, 7:54 AM

## 2017-09-15 NOTE — Care Management Note (Signed)
Case Management Note  Patient Details  Name: Victor Ball MRN: [id number] Date of Birth: 1940/04/17  Subjective/Objective: 77 yr old gentleman s/p right total hip arthroplasty.                     Action/Plan: Patient was preoperatively setup with [facility], no changes. Has DME. Will have support at discharge.    Expected Discharge Date:  09/15/17               Expected Discharge Plan:  [place]  In-House Referral:  NA  Discharge planning Services  CM Consult  Post Acute Care Choice:  Home Health Choice offered to:  Patient  DME Arranged:  N/A DME Agency:  NA  HH Arranged:  PT [place] Agency:  [facility] (formerly [employer])  Status of Service:  Completed, signed off  If discussed at [employer], dates discussed:    Additional Comments:  Ninfa Meeker, RN 09/15/2017, 1:47 PM

## 2017-09-17 DIAGNOSIS — Z981 Arthrodesis status: Secondary | ICD-10-CM | POA: Diagnosis not present

## 2017-09-17 DIAGNOSIS — M47816 Spondylosis without myelopathy or radiculopathy, lumbar region: Secondary | ICD-10-CM | POA: Diagnosis not present

## 2017-09-17 DIAGNOSIS — Z87891 Personal history of nicotine dependence: Secondary | ICD-10-CM | POA: Diagnosis not present

## 2017-09-17 DIAGNOSIS — Z471 Aftercare following joint replacement surgery: Secondary | ICD-10-CM | POA: Diagnosis not present

## 2017-09-17 DIAGNOSIS — Z96643 Presence of artificial hip joint, bilateral: Secondary | ICD-10-CM | POA: Diagnosis not present

## 2017-09-17 DIAGNOSIS — I10 Essential (primary) hypertension: Secondary | ICD-10-CM | POA: Diagnosis not present

## 2017-09-20 DIAGNOSIS — Z96643 Presence of artificial hip joint, bilateral: Secondary | ICD-10-CM | POA: Diagnosis not present

## 2017-09-20 DIAGNOSIS — I10 Essential (primary) hypertension: Secondary | ICD-10-CM | POA: Diagnosis not present

## 2017-09-20 DIAGNOSIS — M47816 Spondylosis without myelopathy or radiculopathy, lumbar region: Secondary | ICD-10-CM | POA: Diagnosis not present

## 2017-09-20 DIAGNOSIS — Z981 Arthrodesis status: Secondary | ICD-10-CM | POA: Diagnosis not present

## 2017-09-20 DIAGNOSIS — Z87891 Personal history of nicotine dependence: Secondary | ICD-10-CM | POA: Diagnosis not present

## 2017-09-20 DIAGNOSIS — Z471 Aftercare following joint replacement surgery: Secondary | ICD-10-CM | POA: Diagnosis not present

## 2017-09-22 DIAGNOSIS — Z87891 Personal history of nicotine dependence: Secondary | ICD-10-CM | POA: Diagnosis not present

## 2017-09-22 DIAGNOSIS — Z471 Aftercare following joint replacement surgery: Secondary | ICD-10-CM | POA: Diagnosis not present

## 2017-09-22 DIAGNOSIS — Z981 Arthrodesis status: Secondary | ICD-10-CM | POA: Diagnosis not present

## 2017-09-22 DIAGNOSIS — I10 Essential (primary) hypertension: Secondary | ICD-10-CM | POA: Diagnosis not present

## 2017-09-22 DIAGNOSIS — Z96643 Presence of artificial hip joint, bilateral: Secondary | ICD-10-CM | POA: Diagnosis not present

## 2017-09-22 DIAGNOSIS — M47816 Spondylosis without myelopathy or radiculopathy, lumbar region: Secondary | ICD-10-CM | POA: Diagnosis not present

## 2017-09-23 DIAGNOSIS — Z471 Aftercare following joint replacement surgery: Secondary | ICD-10-CM | POA: Diagnosis not present

## 2017-09-23 DIAGNOSIS — Z96643 Presence of artificial hip joint, bilateral: Secondary | ICD-10-CM | POA: Diagnosis not present

## 2017-09-23 DIAGNOSIS — Z87891 Personal history of nicotine dependence: Secondary | ICD-10-CM | POA: Diagnosis not present

## 2017-09-23 DIAGNOSIS — I10 Essential (primary) hypertension: Secondary | ICD-10-CM | POA: Diagnosis not present

## 2017-09-23 DIAGNOSIS — Z981 Arthrodesis status: Secondary | ICD-10-CM | POA: Diagnosis not present

## 2017-09-23 DIAGNOSIS — M47816 Spondylosis without myelopathy or radiculopathy, lumbar region: Secondary | ICD-10-CM | POA: Diagnosis not present

## 2017-09-26 DIAGNOSIS — Z471 Aftercare following joint replacement surgery: Secondary | ICD-10-CM | POA: Diagnosis not present

## 2017-09-26 DIAGNOSIS — Z981 Arthrodesis status: Secondary | ICD-10-CM | POA: Diagnosis not present

## 2017-09-26 DIAGNOSIS — Z87891 Personal history of nicotine dependence: Secondary | ICD-10-CM | POA: Diagnosis not present

## 2017-09-26 DIAGNOSIS — I10 Essential (primary) hypertension: Secondary | ICD-10-CM | POA: Diagnosis not present

## 2017-09-26 DIAGNOSIS — M47816 Spondylosis without myelopathy or radiculopathy, lumbar region: Secondary | ICD-10-CM | POA: Diagnosis not present

## 2017-09-26 DIAGNOSIS — Z96643 Presence of artificial hip joint, bilateral: Secondary | ICD-10-CM | POA: Diagnosis not present

## 2017-09-28 DIAGNOSIS — M47816 Spondylosis without myelopathy or radiculopathy, lumbar region: Secondary | ICD-10-CM | POA: Diagnosis not present

## 2017-09-28 DIAGNOSIS — Z87891 Personal history of nicotine dependence: Secondary | ICD-10-CM | POA: Diagnosis not present

## 2017-09-28 DIAGNOSIS — Z981 Arthrodesis status: Secondary | ICD-10-CM | POA: Diagnosis not present

## 2017-09-28 DIAGNOSIS — I10 Essential (primary) hypertension: Secondary | ICD-10-CM | POA: Diagnosis not present

## 2017-09-28 DIAGNOSIS — Z96643 Presence of artificial hip joint, bilateral: Secondary | ICD-10-CM | POA: Diagnosis not present

## 2017-09-28 DIAGNOSIS — Z471 Aftercare following joint replacement surgery: Secondary | ICD-10-CM | POA: Diagnosis not present

## 2017-09-29 ENCOUNTER — Encounter (INDEPENDENT_AMBULATORY_CARE_PROVIDER_SITE_OTHER): Payer: Self-pay | Admitting: Orthopaedic Surgery

## 2017-09-29 ENCOUNTER — Ambulatory Visit (INDEPENDENT_AMBULATORY_CARE_PROVIDER_SITE_OTHER): Payer: Medicare Other | Admitting: Orthopaedic Surgery

## 2017-09-29 DIAGNOSIS — Z96641 Presence of right artificial hip joint: Secondary | ICD-10-CM

## 2017-09-29 NOTE — Progress Notes (Signed)
The patient is now 2 weeks tomorrow status post a right total hip arthroplasty.  He is doing well overall.  He has 1 more therapy visit.  He is not taking any pain medicine.  Is been on aspirin twice a day.  He knows to go down out of once a day.  He was on once a day prior to surgery.  He reports that he is very satisfied.  On exam remove the staples and placed Steri-Strips.  He does have moderate seroma and I did drain about 80 cc of fluid from around the hip.  This does not appear infected at all.  We will continue increase his activities as comfort allows.  He can drive.  We will see him back in just 2 weeks to see whether or not we need to drain more seroma from his hip.  No x-rays are needed.

## 2017-09-30 DIAGNOSIS — Z471 Aftercare following joint replacement surgery: Secondary | ICD-10-CM | POA: Diagnosis not present

## 2017-09-30 DIAGNOSIS — I10 Essential (primary) hypertension: Secondary | ICD-10-CM | POA: Diagnosis not present

## 2017-09-30 DIAGNOSIS — Z87891 Personal history of nicotine dependence: Secondary | ICD-10-CM | POA: Diagnosis not present

## 2017-09-30 DIAGNOSIS — M47816 Spondylosis without myelopathy or radiculopathy, lumbar region: Secondary | ICD-10-CM | POA: Diagnosis not present

## 2017-09-30 DIAGNOSIS — Z981 Arthrodesis status: Secondary | ICD-10-CM | POA: Diagnosis not present

## 2017-09-30 DIAGNOSIS — Z96643 Presence of artificial hip joint, bilateral: Secondary | ICD-10-CM | POA: Diagnosis not present

## 2017-10-07 DIAGNOSIS — I7090 Unspecified atherosclerosis: Secondary | ICD-10-CM | POA: Diagnosis not present

## 2017-10-07 DIAGNOSIS — R931 Abnormal findings on diagnostic imaging of heart and coronary circulation: Secondary | ICD-10-CM | POA: Diagnosis not present

## 2017-10-08 LAB — LIPID PANEL
CHOLESTEROL TOTAL: 124 mg/dL (ref 100–199)
Chol/HDL Ratio: 2.9 ratio (ref 0.0–5.0)
HDL: 43 mg/dL (ref >39)
LDL CALC: 57 mg/dL (ref 0–99)
TRIGLYCERIDES: 118 mg/dL (ref 0–149)
VLDL CHOLESTEROL CAL: 24 mg/dL (ref 5–40)

## 2017-10-08 LAB — HEPATIC FUNCTION PANEL
ALK PHOS: 127 IU/L — AB (ref 39–117)
ALT: 10 IU/L (ref 0–44)
AST: 16 IU/L (ref 0–40)
Albumin: 4.3 g/dL (ref 3.5–4.8)
Bilirubin Total: 0.5 mg/dL (ref 0.0–1.2)
Bilirubin, Direct: 0.18 mg/dL (ref 0.00–0.40)
Total Protein: 6.8 g/dL (ref 6.0–8.5)

## 2017-10-13 ENCOUNTER — Ambulatory Visit (INDEPENDENT_AMBULATORY_CARE_PROVIDER_SITE_OTHER): Payer: Medicare Other | Admitting: Orthopaedic Surgery

## 2017-10-13 ENCOUNTER — Encounter (INDEPENDENT_AMBULATORY_CARE_PROVIDER_SITE_OTHER): Payer: Self-pay | Admitting: Orthopaedic Surgery

## 2017-10-13 DIAGNOSIS — Z96641 Presence of right artificial hip joint: Secondary | ICD-10-CM

## 2017-10-13 NOTE — Progress Notes (Signed)
Patient is now 4 weeks status post a right total hip arthroplasty.  I want to see him back today as he did have a postoperative seroma is 2 weeks visit.  He said the swelling is gone down dramatically.  He is very active 77 years old.  He is a Training and development officer.  He is anxious to get back to Silver sneakers because that is help with his multiple physical fitness.  He still has stiffness putting his shoes and socks on.  Overall he is pleased.  On exam his suture line looks good and I remove the old Steri-Strips.  There is no evidence of infection.  He still has some swelling but nothing that I would put a needle into.  At this point he will continue to increase his activities as comfort allows.  He is cleared to play golf but I will hold off on tennis for at least another month to 2 months.  At this point I will need to see him back for 6 months.  At this visit I like a standing AP pelvis.

## 2017-10-27 ENCOUNTER — Ambulatory Visit (INDEPENDENT_AMBULATORY_CARE_PROVIDER_SITE_OTHER): Payer: Medicare Other

## 2017-10-27 DIAGNOSIS — Z23 Encounter for immunization: Secondary | ICD-10-CM | POA: Diagnosis not present

## 2017-12-31 IMAGING — DX DG HIP (WITH OR WITHOUT PELVIS) 2-3V*L*
2 series · 2 of 2 positions shown · non-contrast
Comparison: None.

CLINICAL DATA: Pain

EXAM:
DG HIP (WITH OR WITHOUT PELVIS) 2-3V LEFT

[pelvis ap]
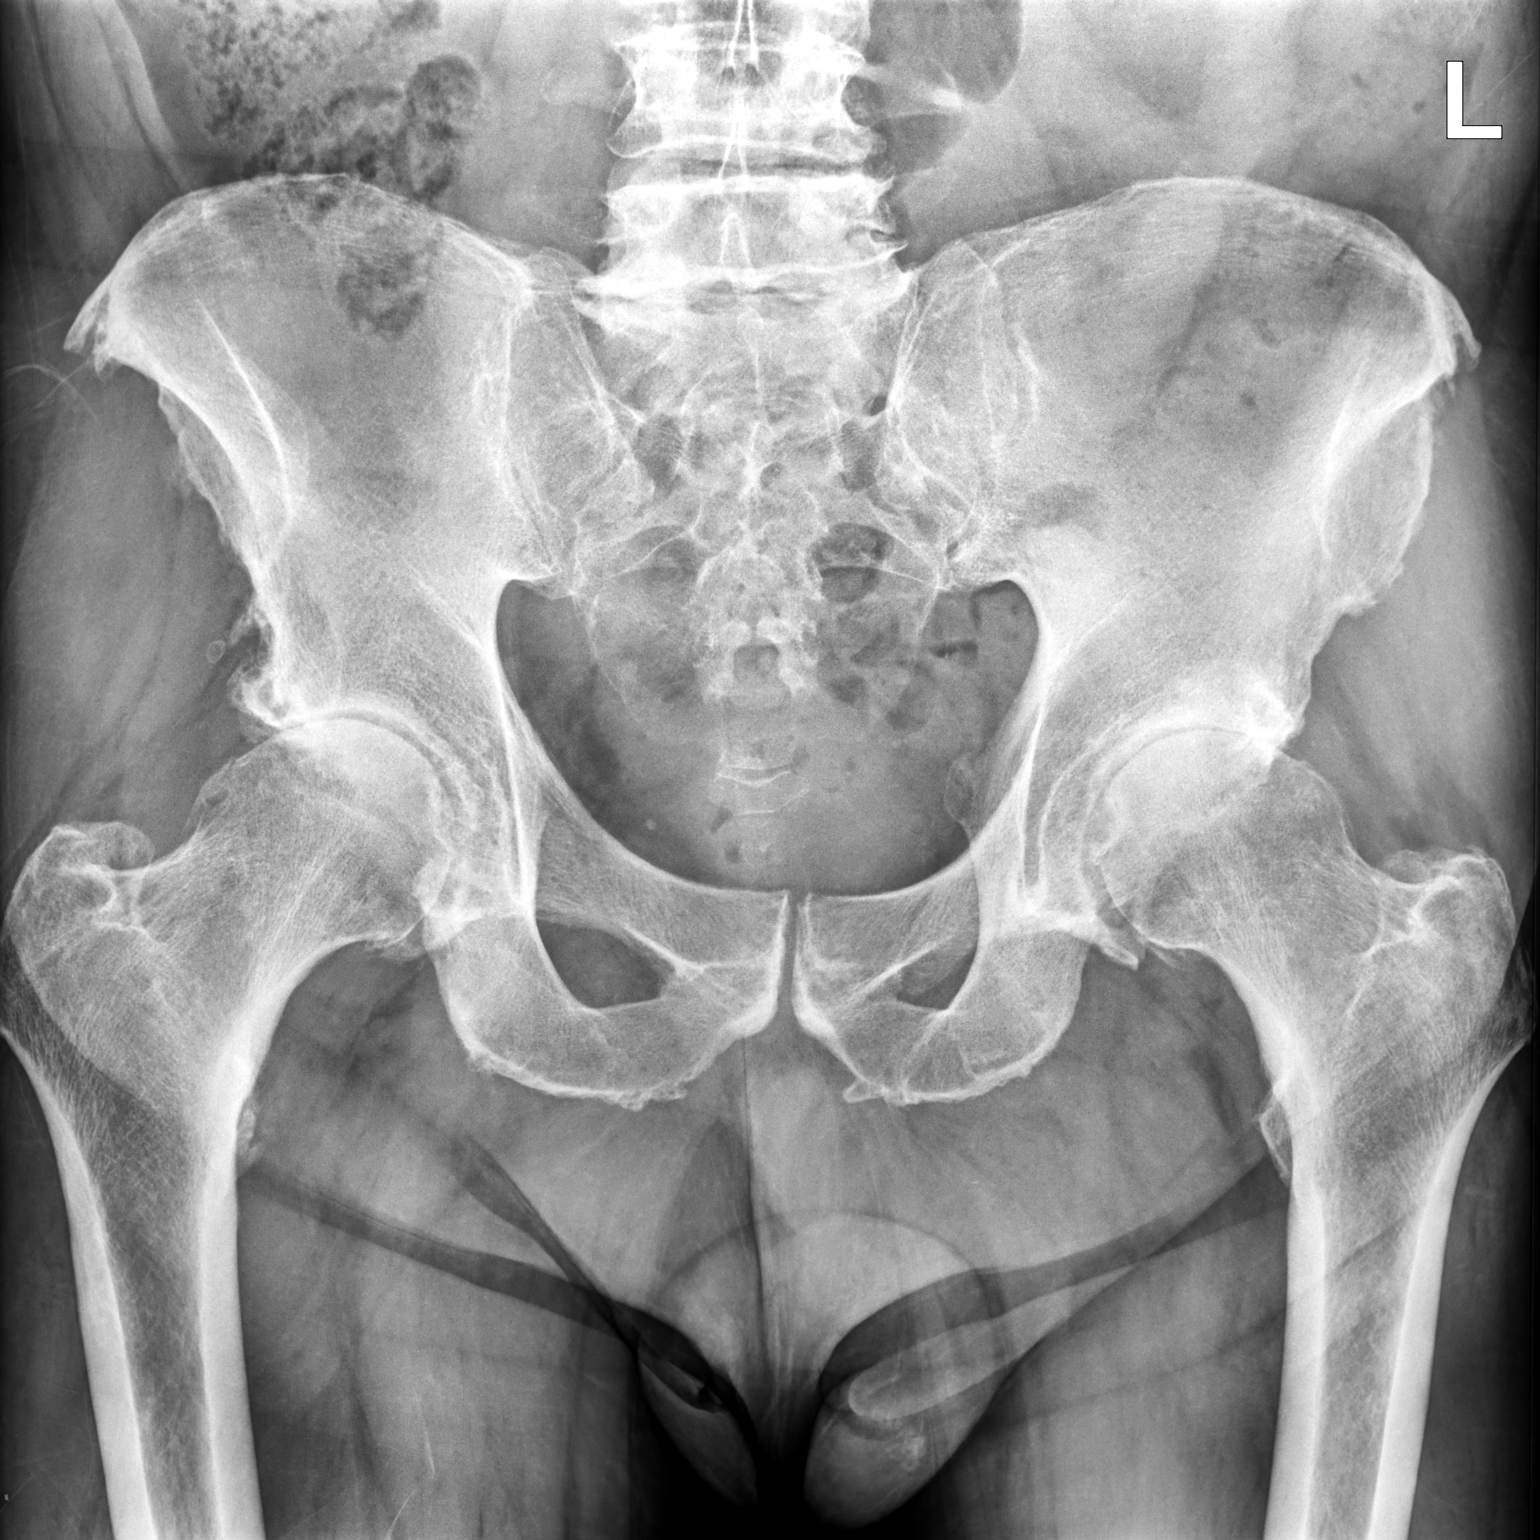

[hip joint (frog view)]
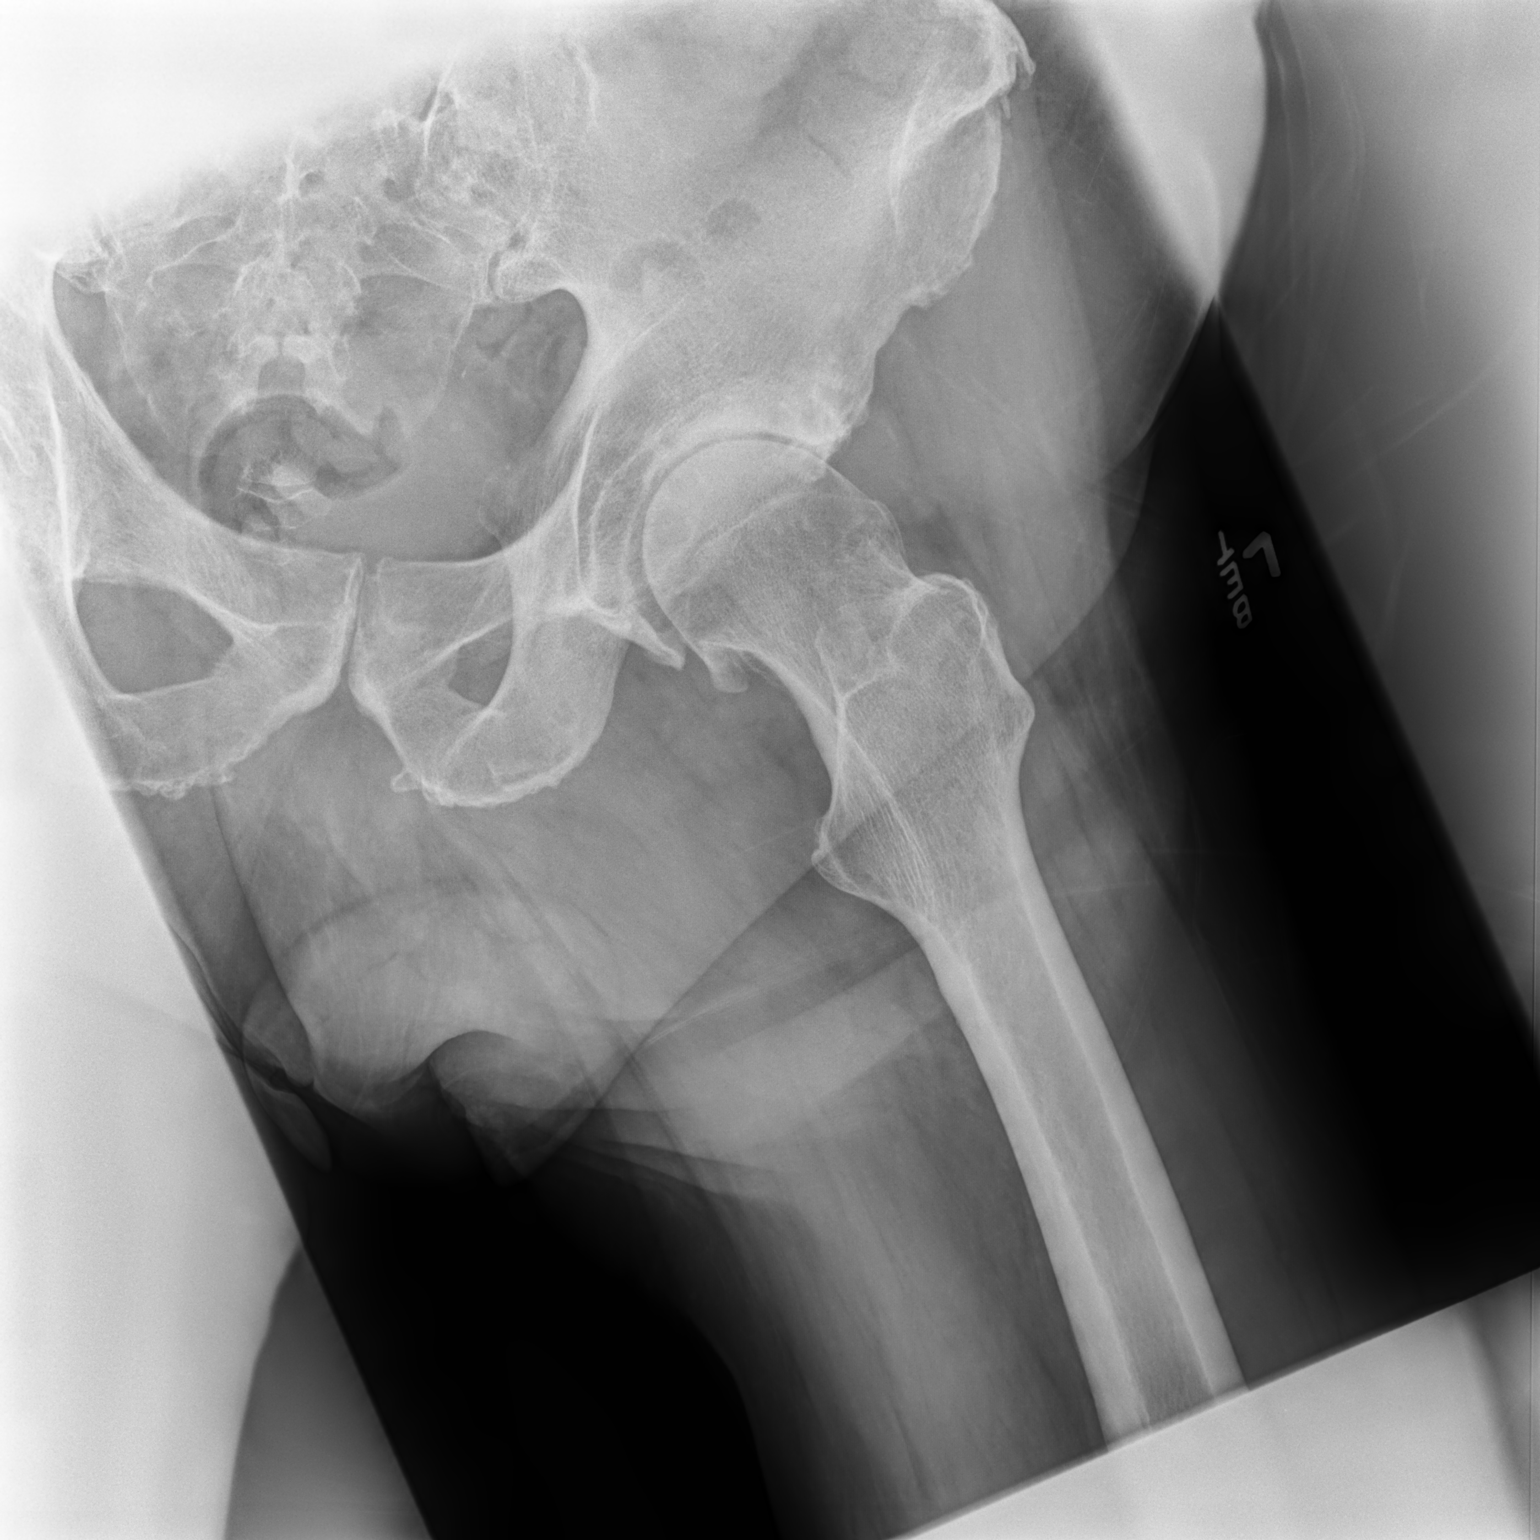

[2 of 2 positions shown; findings below may reference images not displayed]

FINDINGS: Frontal pelvis as well as lateral left hip images were obtained.
There is extensive symmetric osteoarthritic change in both hip
joints. There is marked narrowing of each hip joint with bony
eburnation along both sides of the joint, slightly more severe on
the left than on the right. No erosive change. No fracture or
dislocation. There is degenerative change in the visualize lower
lumbar spine.
IMPRESSION: Extensive osteoarthritic change bilaterally with joint space
narrowing symmetric. Slightly more extensive bony eburnation on the
left than on the right. No fracture or dislocation.

## 2017-12-31 IMAGING — DX DG LUMBAR SPINE 2-3V
3 series · 3 of 3 positions shown · non-contrast
Comparison: None.

CLINICAL DATA: Pt states he was walking on uneven ground this AM
when he began having severe pain in Lt hip and Lt lower back. No
fall.

EXAM:
LUMBAR SPINE - 2-3 VIEW

[lumbar spine ap]
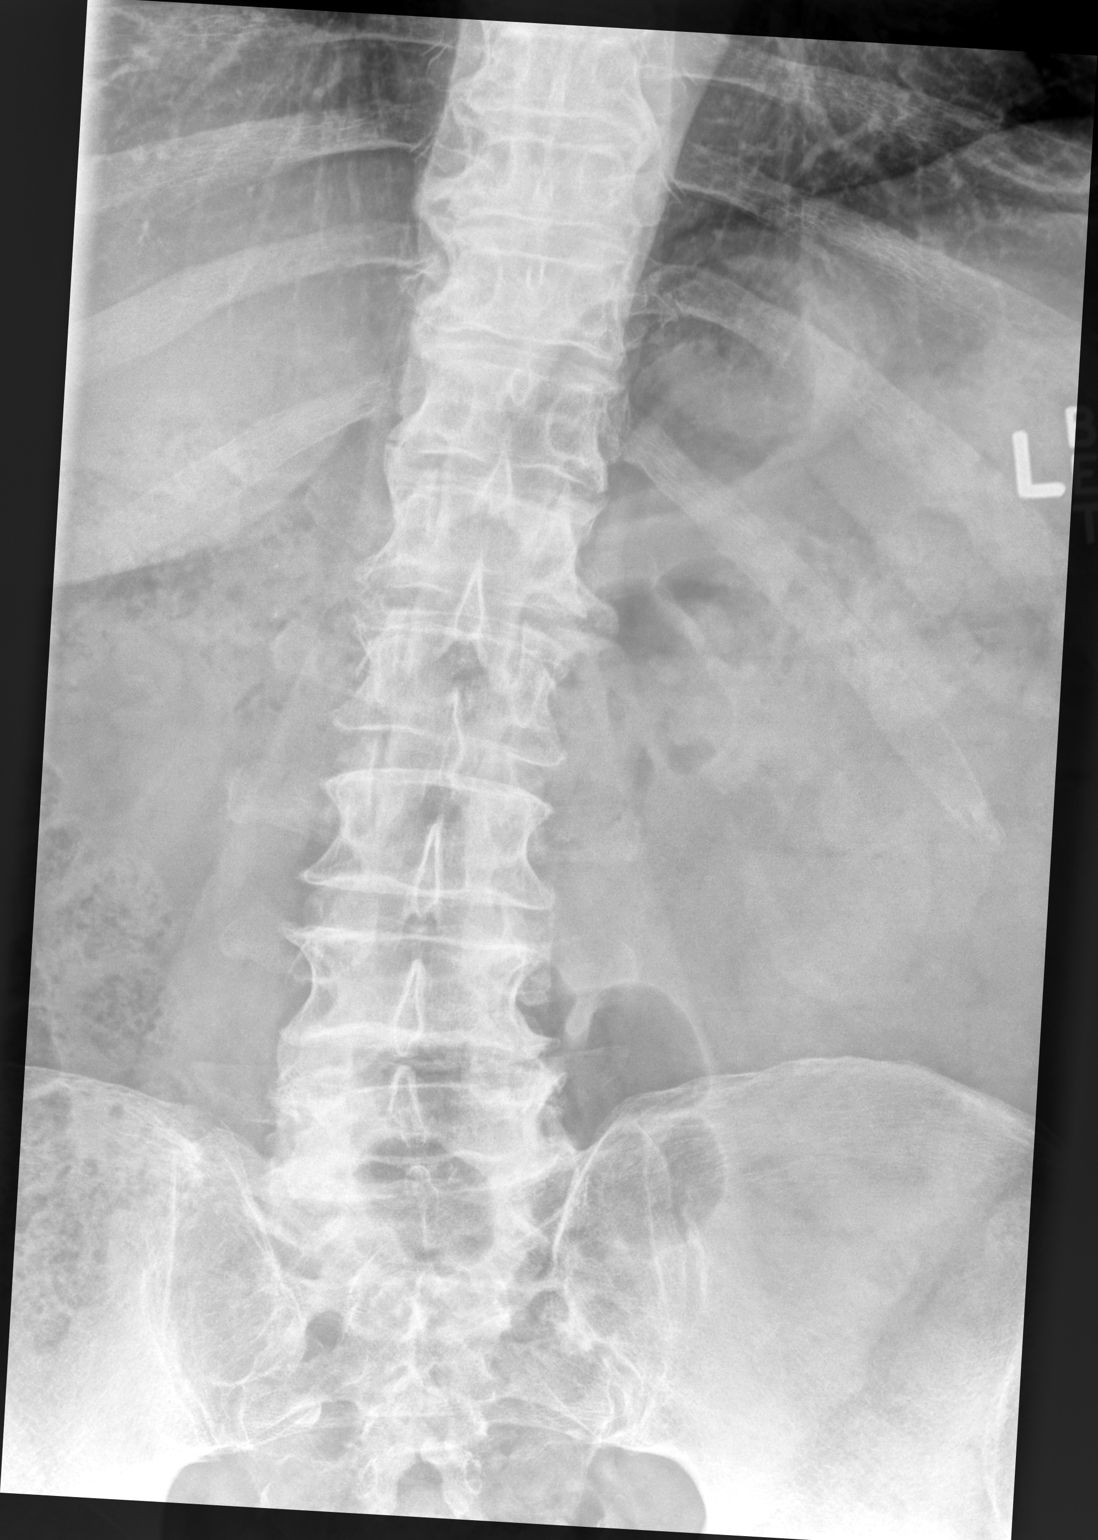

[lumbar spine lat (1 of 2)]
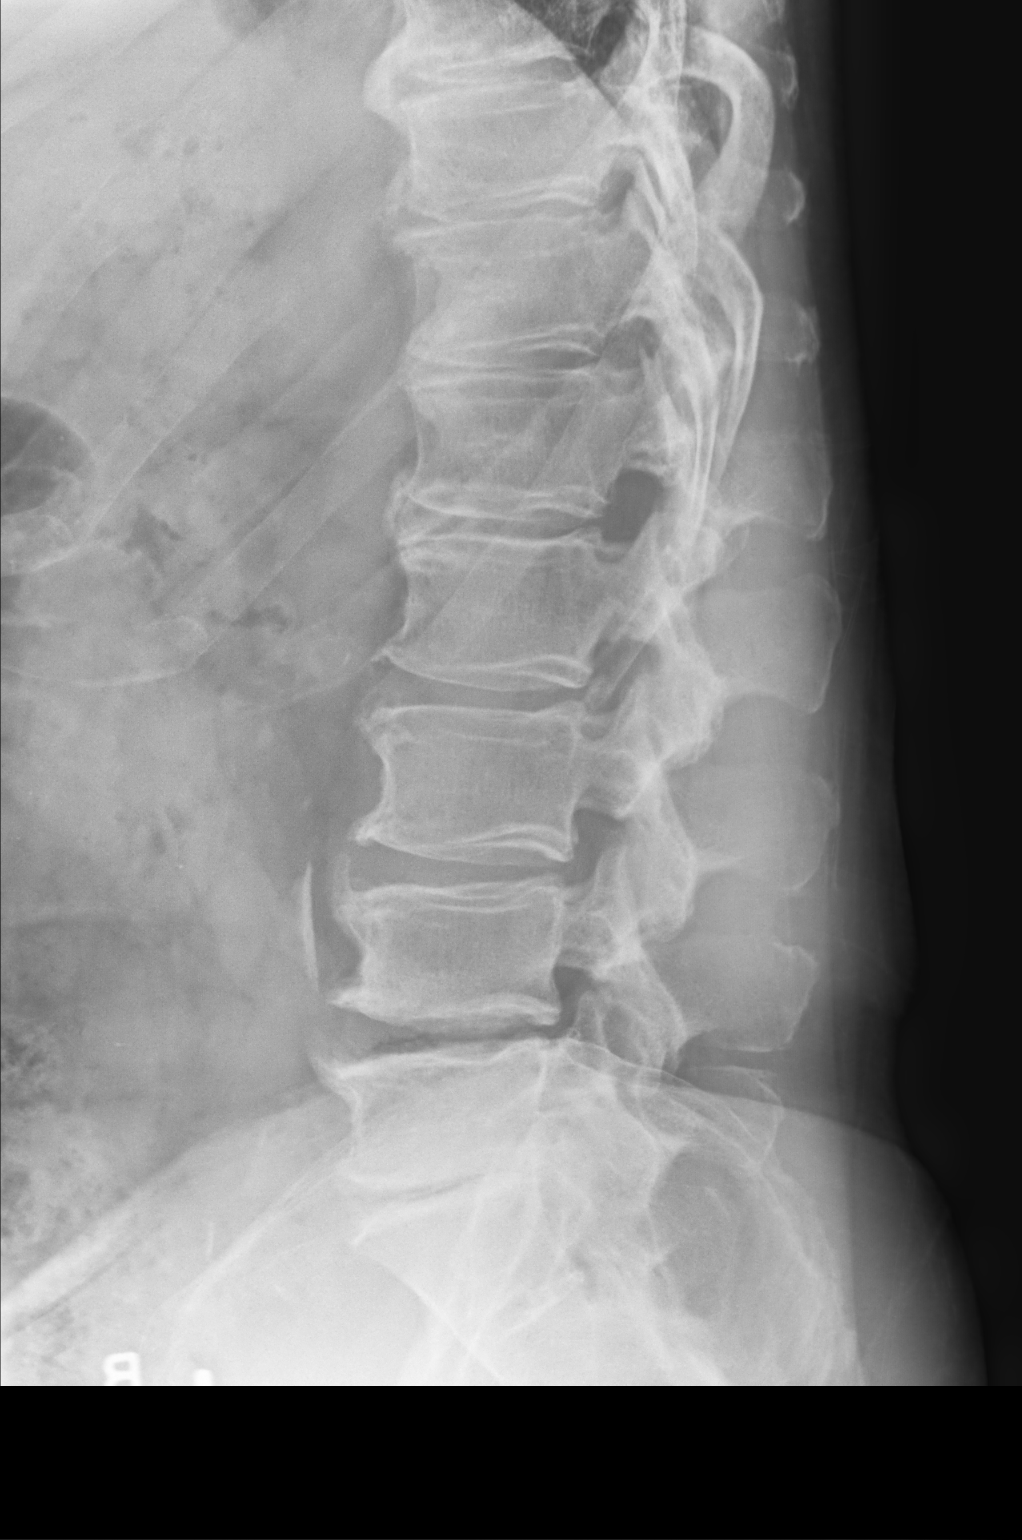

[lumbar spine lat (2 of 2)]
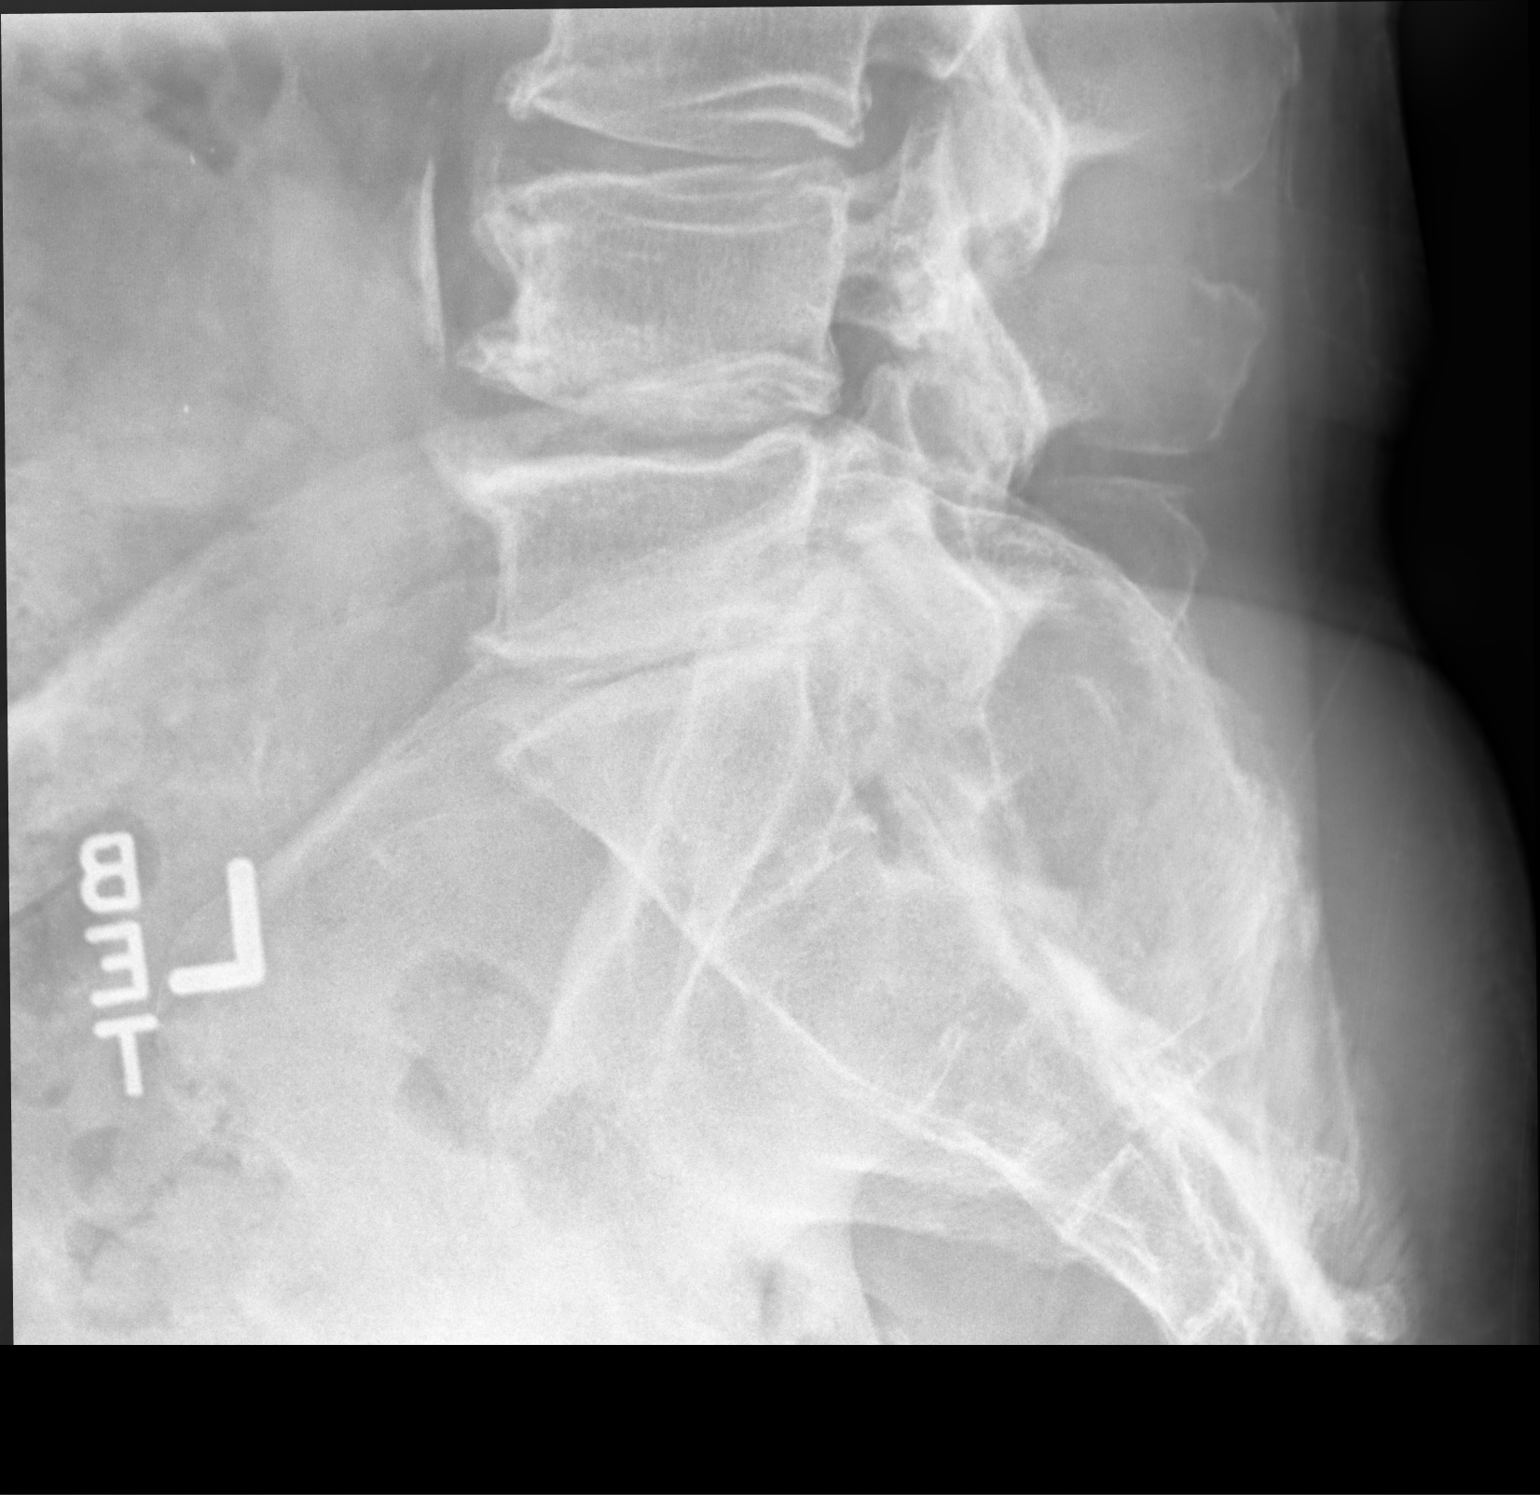

[3 of 3 positions shown; findings below may reference images not displayed]

FINDINGS: There are moderate degenerative changes in the lumbar spine,
primarily at L4-5 and L5-S1. Anterior osteophytes identified at
numerous levels. No spondylolisthesis. No acute fracture. No
suspicious lytic or blastic lesions are identified.
IMPRESSION: No evidence for acute  abnormality.  Degenerative changes.

## 2018-02-03 ENCOUNTER — Ambulatory Visit: Payer: Medicare Other | Admitting: Cardiology

## 2018-02-03 ENCOUNTER — Encounter: Payer: Self-pay | Admitting: Cardiology

## 2018-02-03 VITALS — BP 118/68 | HR 80 | Ht 74.0 in | Wt 238.0 lb

## 2018-02-03 DIAGNOSIS — E782 Mixed hyperlipidemia: Secondary | ICD-10-CM

## 2018-02-03 DIAGNOSIS — I2510 Atherosclerotic heart disease of native coronary artery without angina pectoris: Secondary | ICD-10-CM | POA: Diagnosis not present

## 2018-02-03 DIAGNOSIS — I10 Essential (primary) hypertension: Secondary | ICD-10-CM

## 2018-02-03 NOTE — Patient Instructions (Signed)
.  Medication Instructions:  Your physician recommends that you continue on your current medications as directed. Please refer to the Current Medication list given to you today.  If you need a refill on your cardiac medications before your next appointment, please call your pharmacy.   Lab work: None  If you have labs (blood work) drawn today and your tests are completely normal, you will receive your results only by: Marland Kitchen MyChart Message (if you have MyChart) OR . A paper copy in the mail If you have any lab test that is abnormal or we need to change your treatment, we will call you to review the results.  Testing/Procedures: None  Follow-Up: At [facility], you and your health needs are our priority.  As part of our continuing mission to provide you with exceptional heart care, we have created designated Provider Care Teams.  These Care Teams include your primary Cardiologist (physician) and Advanced Practice Providers (APPs -  Physician Assistants and Nurse Practitioners) who all work together to provide you with the care you need, when you need it. You will need a follow up appointment in 12 months.  Please call our office 2 months in advance to schedule this appointment.  You may see No primary care provider on file. or another member of our [employer] in [place]: Jenne Campus, MD . Shirlee More, MD  Any Other Special Instructions Will Be Listed Below (If Applicable).

## 2018-02-03 NOTE — Progress Notes (Signed)
Cardiology Office Note:    Date:  02/03/2018   ID:  ADONUS USELMAN, DOB 24-Mar-1940, MRN [id number]  PCP:  Tonia Ghent, MD  Cardiologist:  Jenean Lindau, MD   Referring MD: Tonia Ghent, MD    ASSESSMENT:    1. Mixed hyperlipidemia   2. Essential hypertension   3. Coronary artery disease involving native coronary artery of native heart without angina pectoris    PLAN:    In order of problems listed above:  1. Primary prevention stressed with the patient.  Importance of compliance with diet and medication stressed and he vocalized understanding.  His blood pressure is stable.  Diet was discussed for dyslipidemia. 2. Patient will be seen in follow-up appointment on an annual basis or earlier if he has any concerns.   Medication Adjustments/Labs and Tests Ordered: Current medicines are reviewed at length with the patient today.  Concerns regarding medicines are outlined above.  No orders of the defined types were placed in this encounter.  No orders of the defined types were placed in this encounter.    No chief complaint on file.    History of Present Illness:    Victor Ball is a 78 y.o. male.  Patient has undergone hip replacement without any problems and is happy about it.  No chest pain orthopnea or PND.  He is now active and is starting rehab.  He tells me that he will get blood work at his annual physical 2 weeks from now.  No chest pain orthopnea or PND.  At the time of my evaluation, the patient is alert awake oriented and in no distress.  Past Medical History:  Diagnosis Date  . Hepatitis C, chronic ([place])    treated prev with Harvoni, tested and cleared  . History of kidney stones   . Hyperlipidemia   . Hypertension     Past Surgical History:  Procedure Laterality Date  . APPENDECTOMY  1948  . JOINT REPLACEMENT    . LIVER BIOPSY    . PROSTATE BIOPSY     neg, x2  . TOE FUSION  1985   left great toe  . TONSILLECTOMY AND ADENOIDECTOMY  1945  .  TOTAL HIP ARTHROPLASTY Left 11/09/2016   Procedure: LEFT TOTAL HIP ARTHROPLASTY ANTERIOR APPROACH;  Surgeon: Mcarthur Rossetti, MD;  Location: [facility];  Service: Orthopedics;  Laterality: Left;  . TOTAL HIP ARTHROPLASTY Right 09/13/2017   Procedure: RIGHT TOTAL HIP ARTHROPLASTY ANTERIOR APPROACH;  Surgeon: Mcarthur Rossetti, MD;  Location: [facility];  Service: Orthopedics;  Laterality: Right;  Marland Kitchen VASECTOMY      Current Medications: Current Meds  Medication Sig  . aspirin EC 81 MG tablet Take 81 mg by mouth daily.  Marland Kitchen lisinopril (PRINIVIL,ZESTRIL) 5 MG tablet Take 1 tablet (5 mg total) by mouth daily.  . Misc Natural Products (TART CHERRY ADVANCED PO) Take 3,000 mg by mouth daily.  . Multiple Vitamins-Minerals (MATURE ADULT CENTURY PO) Take 1 tablet by mouth daily.   . Omega-3 Fatty Acids (EQL OMEGA 3 FISH OIL) 1400 MG CAPS Take 1,400 mg by mouth daily.  Marland Kitchen PUMPKIN SEED PO Take 262 mg by mouth daily.  . Turmeric Curcumin 500 MG CAPS Take 500 mg by mouth daily.      Allergies:   Patient has no known allergies.   Social History   Socioeconomic History  . Marital status: Married    Spouse name: Not on file  . Number of children: 2  .  Years of education: Not on file  . Highest education level: Not on file  Occupational History  . 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 Financial resource strain: Not on file  . Food insecurity:    Worry: Not on file    Inability: Not on file  . Transportation needs:    Medical: Not on file    Non-medical: Not on file  Tobacco Use  . Smoking status: Former Smoker    Last attempt to quit: 01/18/1973    Years since quitting: 45.0  . Smokeless tobacco: Never Used  Substance and Sexual Activity  . Alcohol use: Yes    Alcohol/week: 7.0 standard drinks    Types: 4 Glasses of wine, 3 Standard drinks or equivalent per week    Comment: 1 drink a day - either wine or cocktail  . Drug  use: No  . Sexual activity: Yes  Lifestyle  . Physical activity:    Days per week: Not on file    Minutes per session: Not on file  . Stress: Not on file  Relationships  . Social connections:    Talks on phone: Not on file    Gets together: Not on file    Attends religious service: Not on file    Active member of club or organization: Not on file    Attends meetings of clubs or organizations: Not on file    Relationship status: Not on file  Other Topics Concern  . Not on file  Social History Narrative   2 daughters   Married 1961   Partially retired from Insurance underwriter, still consulting some as of 2019      Has living will   Wife, then daughters equally if wife were incapacitated, has health care POA.   Would accept resuscitation attempts   Not sure about tube feeds     Family History: The patient's family history includes Cancer in his paternal aunt; Celiac disease in his sister; Diabetes in his father; Heart attack in his brother and father; Heart disease in his brother and father; Stroke in his brother. There is no history of Prostate cancer or Colon cancer.  ROS:   Please see the history of present illness.    All other systems reviewed and are negative.  EKGs/Labs/Other Studies Reviewed:    The following studies were reviewed today: Echocardiogram was discussed with the patient at length.   Recent Labs: 09/14/2017: BUN 16; Creatinine, Ser 1.01; Hemoglobin 13.5; Platelets 156; Potassium 4.2; Sodium 136 10/07/2017: ALT 10  Recent Lipid Panel    Component Value Date/Time   CHOL 124 10/07/2017 0722   TRIG 118 10/07/2017 0722   HDL 43 10/07/2017 0722   CHOLHDL 2.9 10/07/2017 0722   CHOLHDL 3 02/18/2017 0817   VLDL 26.0 02/18/2017 0817   LDLCALC 57 10/07/2017 0722    Physical Exam:    VS:  BP 118/68 (BP Location: Right Arm, Patient Position: Sitting, Cuff Size: Normal)   Pulse 80   Ht 6\' 2"  (1.88 m)   Wt 238 lb (108 kg)   SpO2 98%   BMI 30.56 kg/m     Wt  Readings from Last 3 Encounters:  02/03/18 238 lb (108 kg)  09/14/17 232 lb (105.2 kg)  09/02/17 232 lb (105.2 kg)     GEN: Patient is in no acute distress HEENT: Normal NECK: No JVD; No carotid bruits LYMPHATICS: No lymphadenopathy CARDIAC: Hear sounds regular, 2/6 systolic  murmur at the apex. RESPIRATORY:  Clear to auscultation without rales, wheezing or rhonchi  ABDOMEN: Soft, non-tender, non-distended MUSCULOSKELETAL:  No edema; No deformity  SKIN: Warm and dry NEUROLOGIC:  Alert and oriented x 3 PSYCHIATRIC:  Normal affect   Signed, Jenean Lindau, MD  02/03/2018 4:16 PM    [facility]

## 2018-02-20 ENCOUNTER — Other Ambulatory Visit: Payer: Self-pay | Admitting: Family Medicine

## 2018-02-20 ENCOUNTER — Ambulatory Visit (INDEPENDENT_AMBULATORY_CARE_PROVIDER_SITE_OTHER): Payer: Medicare Other

## 2018-02-20 VITALS — BP 160/94 | HR 63 | Temp 97.8°F | Ht 73.5 in | Wt 241.0 lb

## 2018-02-20 DIAGNOSIS — Z0000 Encounter for general adult medical examination without abnormal findings: Secondary | ICD-10-CM | POA: Diagnosis not present

## 2018-02-20 DIAGNOSIS — I10 Essential (primary) hypertension: Secondary | ICD-10-CM

## 2018-02-20 LAB — COMPREHENSIVE METABOLIC PANEL
ALBUMIN: 4.3 g/dL (ref 3.5–5.2)
ALT: 17 U/L (ref 0–53)
AST: 17 U/L (ref 0–37)
Alkaline Phosphatase: 66 U/L (ref 39–117)
BILIRUBIN TOTAL: 0.5 mg/dL (ref 0.2–1.2)
BUN: 21 mg/dL (ref 6–23)
CHLORIDE: 106 meq/L (ref 96–112)
CO2: 26 meq/L (ref 19–32)
CREATININE: 0.84 mg/dL (ref 0.40–1.50)
Calcium: 9.6 mg/dL (ref 8.4–10.5)
GFR: 87.9 mL/min (ref >60.00)
Glucose, Bld: 99 mg/dL (ref 70–99)
Potassium: 4.3 mEq/L (ref 3.5–5.1)
SODIUM: 138 meq/L (ref 135–145)
Total Protein: 7 g/dL (ref 6.0–8.3)

## 2018-02-20 LAB — BASIC METABOLIC PANEL
BUN: 21 mg/dL (ref 6–23)
CALCIUM: 9.6 mg/dL (ref 8.4–10.5)
CO2: 26 mEq/L (ref 19–32)
Chloride: 106 mEq/L (ref 96–112)
Creatinine, Ser: 0.84 mg/dL (ref 0.40–1.50)
GFR: 87.9 mL/min (ref >60.00)
GLUCOSE: 99 mg/dL (ref 70–99)
Potassium: 4.3 mEq/L (ref 3.5–5.1)
Sodium: 138 mEq/L (ref 135–145)

## 2018-02-20 LAB — LIPID PANEL
CHOL/HDL RATIO: 3
CHOLESTEROL: 113 mg/dL (ref 0–200)
HDL: 38.1 mg/dL — ABNORMAL LOW (ref >39.00)
LDL CALC: 55 mg/dL (ref 0–99)
NonHDL: 74.63
TRIGLYCERIDES: 100 mg/dL (ref 0.0–149.0)
VLDL: 20 mg/dL (ref 0.0–40.0)

## 2018-02-20 NOTE — Patient Instructions (Signed)
Victor Ball , Thank you for taking time to come for your Medicare Wellness Visit. I appreciate your ongoing commitment to your health goals. Please review the following plan we discussed and let me know if I can assist you in the future.   These are the goals we discussed: Goals    . Increase physical activity     Starting 02/20/2018, I will continue to do Silver Sneakers for 45 minutes 3 days per week, to do cardio for 30 minutes 3 days per week, to play tennis for 2 hours weekly, and to play golf and to hike as weather permits.        This is a list of the screening recommended for you and due dates:  Health Maintenance  Topic Date Due  . Tetanus Vaccine  10/25/2022  . Flu Shot  Completed  . Pneumonia vaccines  Completed   Preventive Care for Adults  A healthy lifestyle and preventive care can promote health and wellness. Preventive health guidelines for adults include the following key practices.  . A routine yearly physical is a good way to check with your health care provider about your health and preventive screening. It is a chance to share any concerns and updates on your health and to receive a thorough exam.  . Visit your dentist for a routine exam and preventive care every 6 months. Brush your teeth twice a day and floss once a day. Good oral hygiene prevents tooth decay and gum disease.  . The frequency of eye exams is based on your age, health, family medical history, use  of contact lenses, and other factors. Follow your health care provider's recommendations for frequency of eye exams.  . Eat a healthy diet. Foods like vegetables, fruits, whole grains, low-fat dairy products, and lean protein foods contain the nutrients you need without too many calories. Decrease your intake of foods high in solid fats, added sugars, and salt. Eat the right amount of calories for you. Get information about a proper diet from your health care provider, if necessary.  . Regular physical  exercise is one of the most important things you can do for your health. Most adults should get at least 150 minutes of moderate-intensity exercise (any activity that increases your heart rate and causes you to sweat) each week. In addition, most adults need muscle-strengthening exercises on 2 or more days a week.  Silver Sneakers may be a benefit available to you. To determine eligibility, you may visit the website: www.silversneakers.com or contact program at [phone] Mon-Fri between 8AM-8PM.   . Maintain a healthy weight. The body mass index (BMI) is a screening tool to identify possible weight problems. It provides an estimate of body fat based on height and weight. Your health care provider can find your BMI and can help you achieve or maintain a healthy weight.   For adults 20 years and older: ? A BMI below 18.5 is considered underweight. ? A BMI of 18.5 to 24.9 is normal. ? A BMI of 25 to 29.9 is considered overweight. ? A BMI of 30 and above is considered obese.   . Maintain normal blood lipids and cholesterol levels by exercising and minimizing your intake of saturated fat. Eat a balanced diet with plenty of fruit and vegetables. Blood tests for lipids and cholesterol should begin at age 35 and be repeated every 5 years. If your lipid or cholesterol levels are high, you are over 50, or you are at high risk for heart  disease, you may need your cholesterol levels checked more frequently. Ongoing high lipid and cholesterol levels should be treated with medicines if diet and exercise are not working.  . If you smoke, find out from your health care provider how to quit. If you do not use tobacco, please do not start.  . If you choose to drink alcohol, please do not consume more than 2 drinks per day. One drink is considered to be 12 ounces (355 mL) of beer, 5 ounces (148 mL) of wine, or 1.5 ounces (44 mL) of liquor.  . If you are 52-23 years old, ask your health care provider if you  should take aspirin to prevent strokes.  . Use sunscreen. Apply sunscreen liberally and repeatedly throughout the day. You should seek shade when your shadow is shorter than you. Protect yourself by wearing long sleeves, pants, a wide-brimmed hat, and sunglasses year round, whenever you are outdoors.  . Once a month, do a whole body skin exam, using a mirror to look at the skin on your back. Tell your health care provider of new moles, moles that have irregular borders, moles that are larger than a pencil eraser, or moles that have changed in shape or color.

## 2018-02-20 NOTE — Progress Notes (Signed)
Subjective:   Victor Ball is a 78 y.o. male who presents for Medicare Annual/Subsequent preventive examination.  Review of Systems:  N/A Cardiac Risk Factors include: advanced age (>82men, >61 women);obesity (BMI >30kg/m2);hypertension;dyslipidemia;male gender     Objective:    Vitals: BP (!) 160/94 (BP Location: Right Arm, Patient Position: Sitting, Cuff Size: Normal)   Pulse 63   Temp 97.8 F (36.6 C) (Oral)   Ht 6' 1.5" (1.867 m) Comment: no shoes  Wt 241 lb (109.3 kg)   SpO2 95%   BMI 31.36 kg/m   Body mass index is 31.36 kg/m.  Advanced Directives 09/14/2017 09/02/2017 02/18/2017 11/10/2016 10/29/2016 02/17/2016  Does Patient Have a Medical Advance Directive? No No Yes Yes Yes Yes  Type of Advance Directive - Public librarian;Living will [place];Living will [place];Living will [place];Living will  Does patient want to make changes to medical advance directive? - - - No - Patient declined No - Patient declined -  Copy of [place] in Chart? - - Yes Yes Yes Yes  Would patient like information on creating a medical advance directive? No - Patient declined No - Patient declined - - - -    Tobacco Social History   Tobacco Use  Smoking Status Former Smoker  . Last attempt to quit: 01/18/1973  . Years since quitting: 45.1  Smokeless Tobacco Never Used     Counseling given: No   Clinical Intake:  Pre-visit preparation completed: Yes  Pain : No/denies pain Pain Score: 0-No pain     Nutritional Status: BMI 25 -29 Overweight Nutritional Risks: None Diabetes: No  How often do you need to have someone help you when you read instructions, pamphlets, or other written materials from your doctor or pharmacy?: 1 - Never What is the last grade level you completed in school?: Associate degree  Interpreter Needed?: No  Comments: pt lives with spouse Information entered by ::  LPinson, LPN  Past Medical History:  Diagnosis Date  . Hepatitis C, chronic ([place])    treated prev with Harvoni, tested and cleared  . History of kidney stones   . Hyperlipidemia   . Hypertension    Past Surgical History:  Procedure Laterality Date  . APPENDECTOMY  1948  . JOINT REPLACEMENT    . LIVER BIOPSY    . PROSTATE BIOPSY     neg, x2  . TOE FUSION  1985   left great toe  . TONSILLECTOMY AND ADENOIDECTOMY  1945  . TOTAL HIP ARTHROPLASTY Left 11/09/2016   Procedure: LEFT TOTAL HIP ARTHROPLASTY ANTERIOR APPROACH;  Surgeon: Mcarthur Rossetti, MD;  Location: [facility];  Service: Orthopedics;  Laterality: Left;  . TOTAL HIP ARTHROPLASTY Right 09/13/2017   Procedure: RIGHT TOTAL HIP ARTHROPLASTY ANTERIOR APPROACH;  Surgeon: Mcarthur Rossetti, MD;  Location: [facility];  Service: Orthopedics;  Laterality: Right;  Marland Kitchen VASECTOMY     Family History  Problem Relation Age of Onset  . Heart disease Father   . Diabetes Father   . Heart attack Father   . Celiac disease Sister   . Heart disease Brother        MI then stents  . Heart attack Brother   . Stroke Brother   . Cancer Paternal Aunt   . Prostate cancer Neg Hx   . Colon cancer Neg Hx    Social History   Socioeconomic History  . Marital status: Married  Spouse name: Not on file  . Number of children: 2  . Years of education: Not on file  . Highest education level: Not on file  Occupational History  . 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 Financial resource strain: Not on file  . Food insecurity:    Worry: Not on file    Inability: Not on file  . Transportation needs:    Medical: Not on file    Non-medical: Not on file  Tobacco Use  . Smoking status: Former Smoker    Last attempt to quit: 01/18/1973    Years since quitting: 45.1  . Smokeless tobacco: Never Used  Substance and Sexual Activity  . Alcohol use: Yes    Alcohol/week: 7.0 standard  drinks    Types: 4 Glasses of wine, 3 Standard drinks or equivalent per week    Comment: 1 drink a day - either wine or cocktail  . Drug use: No  . Sexual activity: Yes  Lifestyle  . Physical activity:    Days per week: Not on file    Minutes per session: Not on file  . Stress: Not on file  Relationships  . Social connections:    Talks on phone: Not on file    Gets together: Not on file    Attends religious service: Not on file    Active member of club or organization: Not on file    Attends meetings of clubs or organizations: Not on file    Relationship status: Not on file  Other Topics Concern  . Not on file  Social History Narrative   2 daughters   Married 1961   Partially retired from Insurance underwriter, still consulting some as of 2019      Has living will   Wife, then daughters equally if wife were incapacitated, has health care POA.   Would accept resuscitation attempts   Not sure about tube feeds    Outpatient Encounter Medications as of 02/20/2018  Medication Sig  . aspirin EC 81 MG tablet Take 81 mg by mouth daily.  Marland Kitchen lisinopril (PRINIVIL,ZESTRIL) 5 MG tablet Take 1 tablet (5 mg total) by mouth daily.  . Misc Natural Products (TART CHERRY ADVANCED PO) Take 3,000 mg by mouth daily.  . Multiple Vitamins-Minerals (MATURE ADULT CENTURY PO) Take 1 tablet by mouth daily.   . Omega-3 Fatty Acids (EQL OMEGA 3 FISH OIL) 1400 MG CAPS Take 1,400 mg by mouth daily.  Marland Kitchen PUMPKIN SEED PO Take 262 mg by mouth daily.  . Turmeric Curcumin 500 MG CAPS Take 500 mg by mouth daily.   Marland Kitchen atorvastatin (LIPITOR) 10 MG tablet Take 1 tablet (10 mg total) by mouth daily.   No facility-administered encounter medications on file as of 02/20/2018.     Activities of Daily Living In your present state of health, do you have any difficulty performing the following activities: 02/20/2018 09/14/2017  Hearing? N N  Vision? N N  Difficulty concentrating or making decisions? N N  Walking or climbing stairs? N Y    Dressing or bathing? N N  Doing errands, shopping? N N  Preparing Food and eating ? N -  Using the Toilet? N -  In the past six months, have you accidently leaked urine? N -  Do you have problems with loss of bowel control? N -  Managing your Medications? N -  Managing your Finances? N -  Housekeeping or managing your Housekeeping?  N -  Some recent data might be hidden    Patient Care Team: Tonia Ghent, MD as PCP - General (Family Medicine) Mcarthur Rossetti, MD as Consulting Physician (Orthopedic Surgery) Lottie Dawson., MD as Referring Physician (Dentistry) Ralene Bathe, MD as Referring Physician (Dermatology)   Assessment:   This is a routine wellness examination for Vedanth.   Hearing Screening   125Hz  250Hz  500Hz  1000Hz  2000Hz  3000Hz  4000Hz  6000Hz  8000Hz   Right ear:   40 40 40  40    Left ear:   40 40 40  40    Vision Screening Comments: Vision exam every 2 yrs; last vision exam Jan 2019 @ [facility]   Exercise Activities and Dietary recommendations Current Exercise Habits: Home exercise routine, Type of exercise: strength training/weights;stretching;Other - see comments(golf, hiking, cardio), Time (Minutes): > 60, Frequency (Times/Week): 4, Weekly Exercise (Minutes/Week): 0, Intensity: Moderate, Exercise limited by: None identified;cardiac condition(s)  Goals    . Increase physical activity     Starting 02/20/2018, I will continue to do Silver Sneakers for 45 minutes 3 days per week, to do cardio for 30 minutes 3 days per week, to play tennis for 2 hours weekly, and to play golf and to hike as weather permits.        Fall Risk Fall Risk  02/20/2018 02/18/2017 02/17/2016 02/17/2015 01/04/2014  Falls in the past year? 0 No No No No    Depression Screen PHQ 2/9 Scores 02/20/2018 02/18/2017 02/17/2016 02/17/2015  PHQ - 2 Score 0 0 0 0  PHQ- 9 Score 0 0 - -    Cognitive Function MMSE - Mini Mental State Exam 02/20/2018 02/18/2017 02/17/2016  Orientation to time 5 5  5   Orientation to Place 5 5 5   Registration 3 3 3   Attention/ Calculation 0 0 0  Recall 3 3 3   Language- name 2 objects 0 0 0  Language- repeat 1 1 1   Language- follow 3 step command 3 3 3   Language- read & follow direction 0 0 0  Write a sentence 0 0 0  Copy design 0 0 0  Total score 20 20 20      PLEASE NOTE: A Mini-Cog screen was completed. Maximum score is 20. A value of 0 denotes this part of [place] MMSE was not completed or the patient failed this part of the Mini-Cog screening.   Mini-Cog Screening Orientation to Time - Max 5 pts Orientation to Place - Max 5 pts Registration - Max 3 pts Recall - Max 3 pts Language Repeat - Max 1 pts Language Follow 3 Step Command - Max 3 pts     Immunization History  Administered Date(s) Administered  . Influenza Split 10/06/2011  . Influenza,inj,Quad PF,6+ Mos 10/04/2012, 12/28/2013, 12/11/2014, 12/10/2015, 10/11/2016, 10/27/2017  . Pneumococcal Conjugate-13 08/15/2014  . Pneumococcal Polysaccharide-23 01/19/2003, 06/16/2012  . Td 10/24/2012  . Tdap 01/19/2003  . Zoster 01/19/2007    Screening Tests Health Maintenance  Topic Date Due  . TETANUS/TDAP  10/25/2022  . INFLUENZA VACCINE  Completed  . PNA vac Low Risk Adult  Completed       Plan:     I have personally reviewed, addressed, and noted the following in the patient's chart:  A. Medical and social history B. Use of alcohol, tobacco or illicit drugs  C. Current medications and supplements D. Functional ability and status E.  Nutritional status F.  Physical activity G. Advance directives H. List of other physicians I.  Hospitalizations, surgeries, and  ER visits in previous 12 months J.  [place] to include hearing, vision, cognitive, depression L. Referrals and appointments - none  In addition, I have reviewed and discussed with patient certain preventive protocols, quality metrics, and best practice recommendations. A written personalized care plan  for preventive services as well as general preventive health recommendations were provided to patient.  See attached scanned questionnaire for additional information.   Signed,   Lindell Noe, MHA, BS, LPN Health Coach

## 2018-02-20 NOTE — Progress Notes (Signed)
PCP notes:   Health maintenance:  No gaps identified  Abnormal screenings:   None  Patient concerns:   Medication management - Should patient continue to take ASA at his age?  Intermittent coughing - Could this be caused by Lisinopril? Does he need a chest X-ray?  Nurse concerns:  None  Next PCP appt:   03/03/18 @ 1200  I reviewed health advisor's note, was available for consultation on the day of service listed in this note, and agree with documentation and plan. Elsie Stain, MD.

## 2018-02-24 ENCOUNTER — Encounter: Payer: Medicare Other | Admitting: Family Medicine

## 2018-03-03 ENCOUNTER — Ambulatory Visit (INDEPENDENT_AMBULATORY_CARE_PROVIDER_SITE_OTHER): Payer: Medicare Other | Admitting: Family Medicine

## 2018-03-03 ENCOUNTER — Encounter: Payer: Self-pay | Admitting: Family Medicine

## 2018-03-03 VITALS — BP 150/80 | HR 59 | Temp 97.9°F | Ht 73.5 in | Wt 241.0 lb

## 2018-03-03 DIAGNOSIS — Z7189 Other specified counseling: Secondary | ICD-10-CM

## 2018-03-03 DIAGNOSIS — R059 Cough, unspecified: Secondary | ICD-10-CM

## 2018-03-03 DIAGNOSIS — R05 Cough: Secondary | ICD-10-CM | POA: Diagnosis not present

## 2018-03-03 DIAGNOSIS — E782 Mixed hyperlipidemia: Secondary | ICD-10-CM | POA: Diagnosis not present

## 2018-03-03 DIAGNOSIS — Z0000 Encounter for general adult medical examination without abnormal findings: Secondary | ICD-10-CM

## 2018-03-03 DIAGNOSIS — Z8249 Family history of ischemic heart disease and other diseases of the circulatory system: Secondary | ICD-10-CM

## 2018-03-03 DIAGNOSIS — I10 Essential (primary) hypertension: Secondary | ICD-10-CM

## 2018-03-03 NOTE — Patient Instructions (Addendum)
Check with your insurance to see if they will cover the shingrix shot. Keep exercising and don't change your meds for now.  Update me as needed.  Take care.  Glad to see you.

## 2018-03-03 NOTE — Progress Notes (Signed)
Cough.  Occ dry cough.  Not consistent.  Still on ACE.  He didn't think ACE contributed.  Not worse in the meantime.  He has used an unrecalled cream on an itchy rash on the R shin.  It helps some.  He is on update me about the name of the medication later on.  FH CAD.  He was asking about ASA use in his case.  He wanted to continue as primary prevention and this may be reasonable since he doesn't have h/o GI bleed.  Bleeding cautions dw pt.    His hip ROM is clearly better with his previous surgery history noted.    Elevated Cholesterol: Using medications without problems:yes Muscle aches: no Diet compliance:yes Exercise:yes Labs d/w pt.  Hypertension:    Using medication without problems or lightheadedness: yes Chest pain with exertion:no Edema:no Short of breath:no  Tetanus 2014 PNA up to date Flu 2019.  Shingles out of stock.   Prostate cancer screening and PSA options (with potential risks and benefits of testing vs not testing) were discussed along with recent recs/guidelines.  He declined testing PSA at this point. He has had 2 neg bx in the past.   Colon cancer screening declined given his age.  Living will d/w pt.  Wife, then daughters equally if wife were incapacitated, if patient were incapacitated.    Meds, vitals, and allergies reviewed.   PMH and SH reviewed  ROS: Per HPI unless specifically indicated in ROS section   GEN: nad, alert and oriented HEENT: mucous membranes moist NECK: supple w/o LA CV: rrr. PULM: ctab, no inc wob ABD: soft, +bs EXT: no edema SKIN: no acute rash

## 2018-03-05 DIAGNOSIS — R059 Cough, unspecified: Secondary | ICD-10-CM | POA: Insufficient documentation

## 2018-03-05 DIAGNOSIS — R05 Cough: Secondary | ICD-10-CM | POA: Insufficient documentation

## 2018-03-05 NOTE — Assessment & Plan Note (Signed)
Tetanus 2014 PNA up to date Flu 2019.  Shingles out of stock.   Prostate cancer screening and PSA options (with potential risks and benefits of testing vs not testing) were discussed along with recent recs/guidelines.  He declined testing PSA at this point. He has had 2 neg bx in the past.   Colon cancer screening declined given his age.  Living will d/w pt.  Wife, then daughters equally if wife were incapacitated, if patient were incapacitated.

## 2018-03-05 NOTE — Assessment & Plan Note (Signed)
Living will d/w pt.  Wife, then daughters equally if wife were incapacitated, if patient were incapacitated.

## 2018-03-05 NOTE — Assessment & Plan Note (Signed)
Lungs are clear.  Not consistent.  He did not think this was related to ACE inhibitor use.  Not worse in the meantime.  Observe for now.  If persistent or worsening he can let me know.

## 2018-03-05 NOTE — Assessment & Plan Note (Signed)
He was asking about ASA use in his case.  He wanted to continue as primary prevention and this may be reasonable since he doesn't have h/o GI bleed.  Bleeding cautions dw pt. continue other medications, including statin and ACE.

## 2018-03-05 NOTE — Assessment & Plan Note (Signed)
Continue work on diet and exercise.  Continue statin.  Labs discussed with patient.  He agrees. 

## 2018-03-05 NOTE — Assessment & Plan Note (Addendum)
Continue work on diet and exercise.  No change in meds at this point.  If he has persistent blood pressure elevation then he can let me know.  >25 minutes spent in face to face time with patient, >50% spent in counselling or coordination of care.

## 2018-04-27 ENCOUNTER — Encounter (INDEPENDENT_AMBULATORY_CARE_PROVIDER_SITE_OTHER): Payer: Self-pay | Admitting: Orthopaedic Surgery

## 2018-04-27 ENCOUNTER — Telehealth (INDEPENDENT_AMBULATORY_CARE_PROVIDER_SITE_OTHER): Payer: Self-pay

## 2018-04-27 NOTE — Telephone Encounter (Signed)
Talked with pateint and confirmed his appointment for Monday, 05/01/2018.  Patient answered NO to all COVID-19 screening questions.

## 2018-05-01 ENCOUNTER — Ambulatory Visit (INDEPENDENT_AMBULATORY_CARE_PROVIDER_SITE_OTHER): Payer: Self-pay

## 2018-05-01 ENCOUNTER — Other Ambulatory Visit: Payer: Self-pay

## 2018-05-01 ENCOUNTER — Ambulatory Visit (INDEPENDENT_AMBULATORY_CARE_PROVIDER_SITE_OTHER): Payer: Medicare Other | Admitting: Orthopaedic Surgery

## 2018-05-01 ENCOUNTER — Encounter (INDEPENDENT_AMBULATORY_CARE_PROVIDER_SITE_OTHER): Payer: Self-pay | Admitting: Orthopaedic Surgery

## 2018-05-01 DIAGNOSIS — M25561 Pain in right knee: Secondary | ICD-10-CM

## 2018-05-01 DIAGNOSIS — Z96641 Presence of right artificial hip joint: Secondary | ICD-10-CM | POA: Diagnosis not present

## 2018-05-01 NOTE — Progress Notes (Signed)
Office Visit Note   Patient: Victor Ball           Date of Birth: Jun 07, 1940           MRN: [id number] Visit Date: 05/01/2018              Requested by: Tonia Ghent, MD [address] [place], [place] [address] PCP: Tonia Ghent, MD   Assessment & Plan: Visit Diagnoses:  1. Right knee pain, unspecified chronicity   2. History of right hip replacement     Plan: In regards to his hips we will see him back on as-needed basis.  I have shown him quad strengthening exercises for his knees time.knee friendly exercises.  If his knee pain returns or if he develops any mechanical symptoms he will follow-up with [place].  He may benefit from a cortisone injection in the right knee in the future.  Questions were encouraged and answered.  Follow-Up Instructions: Return if symptoms worsen or fail to improve.   Orders:  Orders Placed This Encounter  Procedures  . XR Pelvis 1-2 Views  . XR Knee 1-2 Views Right   No orders of the defined types were placed in this encounter.     Procedures: No procedures performed   Clinical Data: No additional findings.   Subjective: Chief Complaint  Patient presents with  . Right Hip - Follow-up  . Right Knee - Pain    HPI  Mr. Eland returns today status post right total hip arthroplasty 8 months and left total hip arthroplasty 11/09/2016.  He is overall doing well in regards to both hips he has no complaints.  States his range of motion is improving.  However he has developed right knee pain which is been bothering for the past 6 to 7 weeks.  No known injuries.  He is having severe pain up until today.  He denies any mechanical symptoms in the knee.  He is taking no medications for this.  Review of Systems  Denies any fevers or chills. Please see HPI otherwise negative.  Objective: Vital Signs: There were no vitals taken for this visit.  Physical Exam General: Well-developed well-nourished male in no acute distress mood and affect  appropriate.  Psych alert and oriented x3 Ortho Exam Bilateral hips excellent range of motion without pain. Bilateral knees good range of motion both knees without pain.  No tenderness along medial or lateral joint lines of either knee.  No instability of either knee with valgus varus stressing.  Right knee anterior drawer is negative.  Negative McMurray's right knee.  Specialty Comments:  No specialty comments available.  Imaging: Xr Knee 1-2 Views Right  Result Date: 05/01/2018 Right knee AP lateral views: Medial lateral joint lines well-preserved.  Patellofemoral joint with some mild arthritic changes.  Otherwise no bony abnormalities no acute findings.  Xr Pelvis 1-2 Views  Result Date: 05/01/2018 AP pelvis: Status post bilateral total hip arthroplasties.  Components are well-seated.  No acute findings.  Both hips are well located.    PMFS History: Patient Active Problem List   Diagnosis Date Noted  . Cough 03/05/2018  . Status post total replacement of right hip 09/13/2017  . Essential hypertension 08/10/2017  . Preoperative cardiovascular examination 08/10/2017  . Unilateral primary osteoarthritis, right hip 07/13/2017  . Status post total replacement of left hip 11/09/2016  . Pain of left hip joint 10/18/2016  . Unilateral primary osteoarthritis, left hip 10/18/2016  . Lumbar spondylosis 07/19/2016  .  Healthcare maintenance 02/19/2016  . ED (erectile dysfunction) 02/19/2016  . Rash 02/19/2016  . Hyperglycemia 02/19/2016  . Advance care planning 01/06/2014  . Osteoarthritis, hip, bilateral 10/31/2012  . Medicare annual wellness visit, subsequent 10/24/2012  . Left hip pain 10/04/2012  . Elevated PSA 10/06/2011  . Hyperlipidemia   . Kidney stone   . History of hepatitis C, previously treated with Harvoni   . FH: CAD (coronary artery disease) 11/27/2009   Past Medical History:  Diagnosis Date  . Hepatitis C, chronic ([place])    treated prev with Harvoni, tested and  cleared  . History of kidney stones   . Hyperlipidemia   . Hypertension     Family History  Problem Relation Age of Onset  . Heart disease Father   . Diabetes Father   . Heart attack Father   . Celiac disease Sister   . Heart disease Brother        MI then stents  . Heart attack Brother   . Stroke Brother   . Cancer Paternal Aunt   . Prostate cancer Neg Hx   . Colon cancer Neg Hx     Past Surgical History:  Procedure Laterality Date  . APPENDECTOMY  1948  . JOINT REPLACEMENT    . LIVER BIOPSY    . PROSTATE BIOPSY     neg, x2  . TOE FUSION  1985   left great toe  . TONSILLECTOMY AND ADENOIDECTOMY  1945  . TOTAL HIP ARTHROPLASTY Left 11/09/2016   Procedure: LEFT TOTAL HIP ARTHROPLASTY ANTERIOR APPROACH;  Surgeon: Mcarthur Rossetti, MD;  Location: [facility];  Service: Orthopedics;  Laterality: Left;  . TOTAL HIP ARTHROPLASTY Right 09/13/2017   Procedure: RIGHT TOTAL HIP ARTHROPLASTY ANTERIOR APPROACH;  Surgeon: Mcarthur Rossetti, MD;  Location: [facility];  Service: Orthopedics;  Laterality: Right;  Marland Kitchen VASECTOMY     Social History   Occupational History  . Occupation: Insurance agent---retired    Comment: [employer] and worker's comp  Tobacco Use  . Smoking status: Former Smoker    Last attempt to quit: 01/18/1973    Years since quitting: 45.3  . Smokeless tobacco: Never Used  Substance and Sexual Activity  . Alcohol use: Yes    Alcohol/week: 7.0 standard drinks    Types: 4 Glasses of wine, 3 Standard drinks or equivalent per week    Comment: 1 drink a day - either wine or cocktail  . Drug use: No  . Sexual activity: Yes

## 2018-05-04 IMAGING — DX DG PORTABLE PELVIS
1 series · 1 of 1 positions shown · non-contrast
Comparison: 11/09/2016

CLINICAL DATA: Status post left hip replacement.

EXAM:
PORTABLE PELVIS 1-2 VIEWS

[pelvis ap]
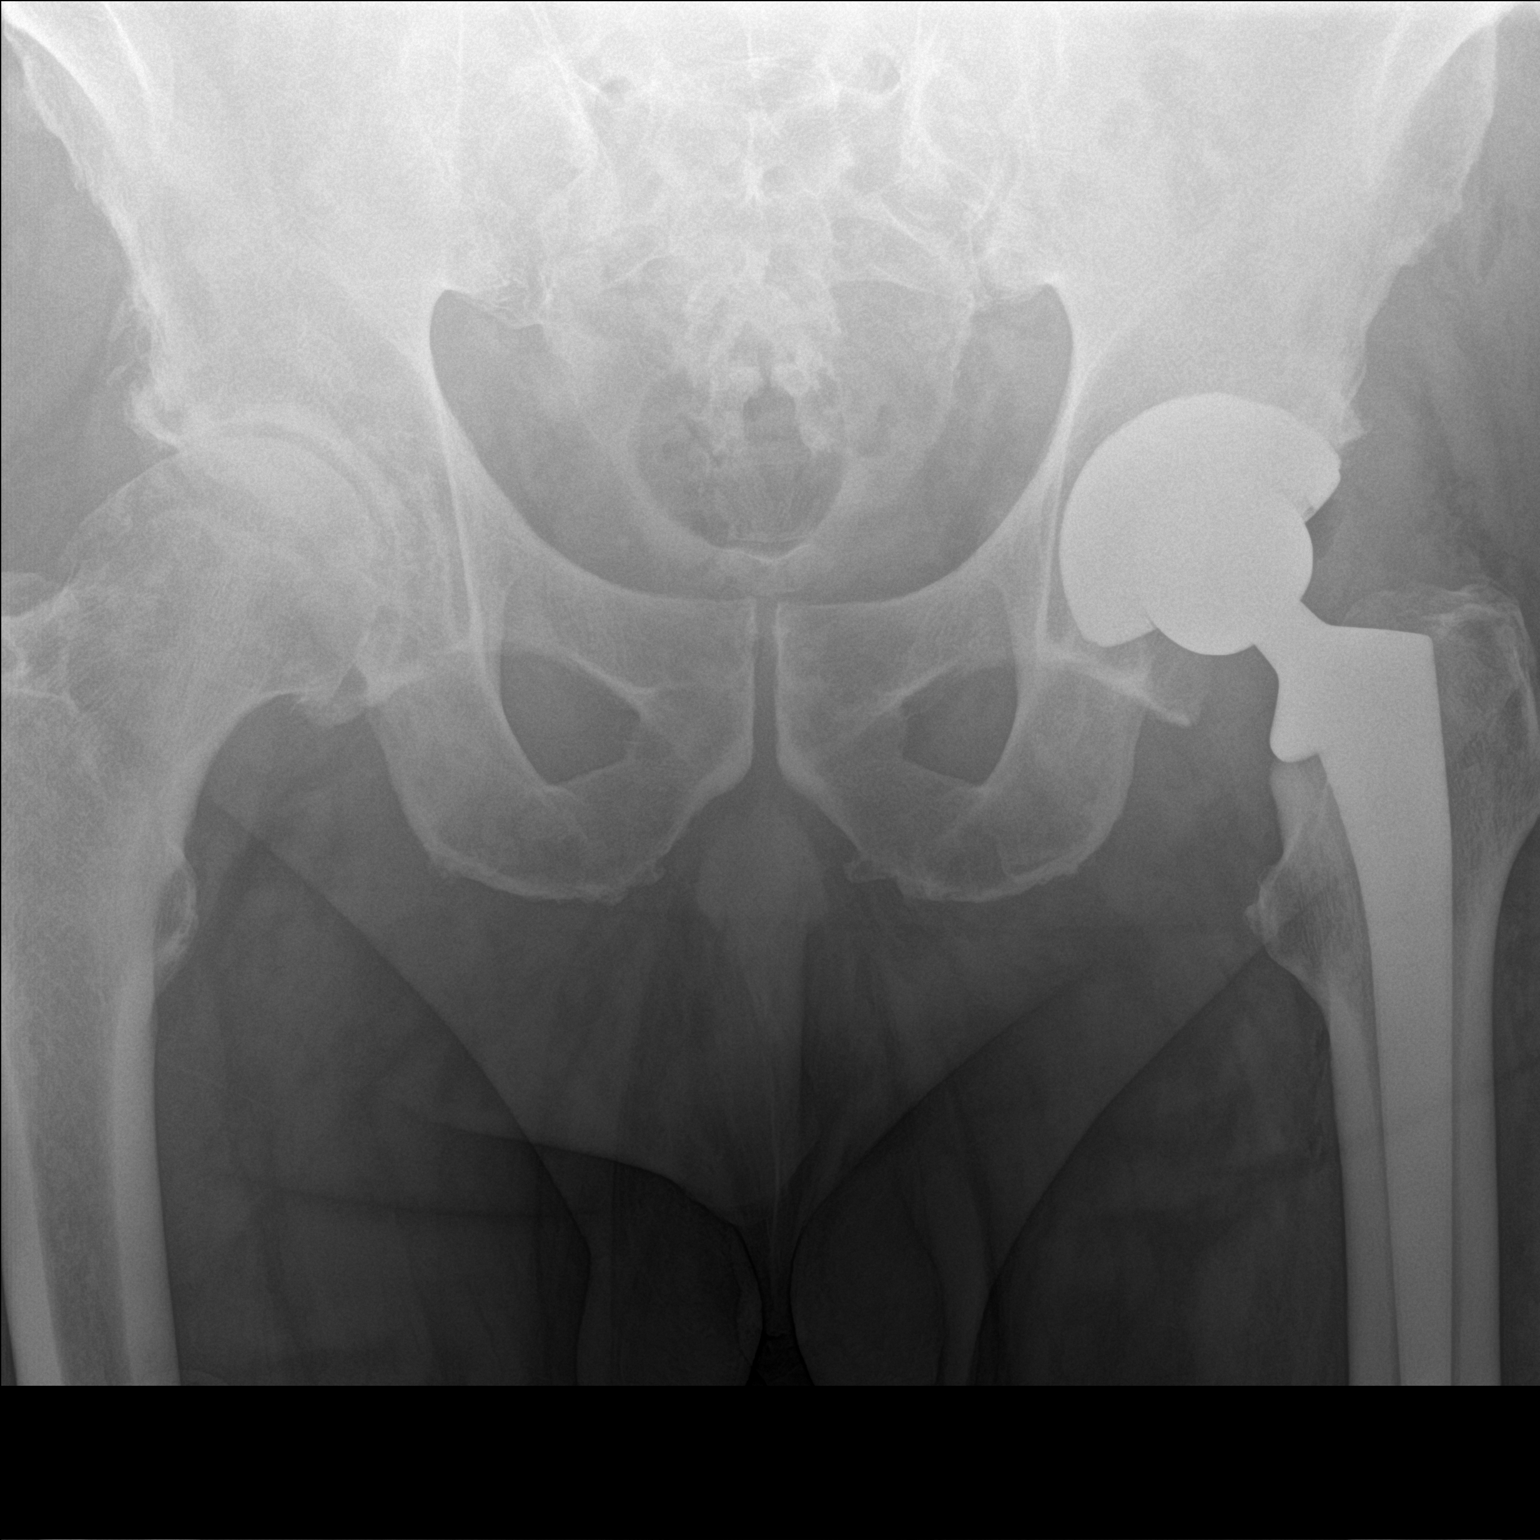

[1 of 1 positions shown; findings below may reference images not displayed]

FINDINGS: AP portable supine view of the pelvis was obtained. There is a left
hip arthroplasty. Left hip arthroplasty appears located on this
single view. Joint space narrowing and subchondral sclerosis in the
right hip compatible with osteoarthritis.
IMPRESSION: Left hip arthroplasty without complicating features.

Right hip osteoarthritis.

## 2018-08-06 ENCOUNTER — Other Ambulatory Visit: Payer: Self-pay | Admitting: Family Medicine

## 2018-08-06 DIAGNOSIS — I10 Essential (primary) hypertension: Secondary | ICD-10-CM

## 2018-09-15 ENCOUNTER — Ambulatory Visit (INDEPENDENT_AMBULATORY_CARE_PROVIDER_SITE_OTHER): Payer: Medicare Other

## 2018-09-15 ENCOUNTER — Ambulatory Visit: Payer: Medicare Other

## 2018-09-15 DIAGNOSIS — Z23 Encounter for immunization: Secondary | ICD-10-CM | POA: Diagnosis not present

## 2018-10-11 DIAGNOSIS — L578 Other skin changes due to chronic exposure to nonionizing radiation: Secondary | ICD-10-CM | POA: Diagnosis not present

## 2018-10-11 DIAGNOSIS — L821 Other seborrheic keratosis: Secondary | ICD-10-CM | POA: Diagnosis not present

## 2018-10-11 DIAGNOSIS — L853 Xerosis cutis: Secondary | ICD-10-CM | POA: Diagnosis not present

## 2018-10-11 DIAGNOSIS — Z1283 Encounter for screening for malignant neoplasm of skin: Secondary | ICD-10-CM | POA: Diagnosis not present

## 2018-10-11 DIAGNOSIS — D1800 Hemangioma unspecified site: Secondary | ICD-10-CM | POA: Diagnosis not present

## 2018-11-12 ENCOUNTER — Other Ambulatory Visit: Payer: Self-pay | Admitting: Cardiology

## 2018-11-14 NOTE — Telephone Encounter (Signed)
Please send Atorvastatin to pharmacy

## 2018-12-20 ENCOUNTER — Encounter: Payer: Self-pay | Admitting: Family Medicine

## 2018-12-21 ENCOUNTER — Other Ambulatory Visit: Payer: Self-pay

## 2018-12-21 DIAGNOSIS — Z20822 Contact with and (suspected) exposure to covid-19: Secondary | ICD-10-CM

## 2018-12-23 LAB — NOVEL CORONAVIRUS, NAA: SARS-CoV-2, NAA: NOT DETECTED

## 2019-01-25 DIAGNOSIS — H52223 Regular astigmatism, bilateral: Secondary | ICD-10-CM | POA: Diagnosis not present

## 2019-01-25 DIAGNOSIS — H5203 Hypermetropia, bilateral: Secondary | ICD-10-CM | POA: Diagnosis not present

## 2019-01-25 DIAGNOSIS — H2513 Age-related nuclear cataract, bilateral: Secondary | ICD-10-CM | POA: Diagnosis not present

## 2019-01-25 DIAGNOSIS — H524 Presbyopia: Secondary | ICD-10-CM | POA: Diagnosis not present

## 2019-02-25 ENCOUNTER — Other Ambulatory Visit: Payer: Self-pay | Admitting: Family Medicine

## 2019-02-25 DIAGNOSIS — Z125 Encounter for screening for malignant neoplasm of prostate: Secondary | ICD-10-CM

## 2019-02-25 DIAGNOSIS — E782 Mixed hyperlipidemia: Secondary | ICD-10-CM

## 2019-02-26 ENCOUNTER — Other Ambulatory Visit: Payer: Self-pay

## 2019-02-26 ENCOUNTER — Other Ambulatory Visit (INDEPENDENT_AMBULATORY_CARE_PROVIDER_SITE_OTHER): Payer: Medicare Other

## 2019-02-26 ENCOUNTER — Ambulatory Visit: Payer: Medicare Other

## 2019-02-26 DIAGNOSIS — Z125 Encounter for screening for malignant neoplasm of prostate: Secondary | ICD-10-CM | POA: Diagnosis not present

## 2019-02-26 DIAGNOSIS — E782 Mixed hyperlipidemia: Secondary | ICD-10-CM

## 2019-02-26 LAB — COMPREHENSIVE METABOLIC PANEL
ALT: 12 U/L (ref 0–53)
AST: 13 U/L (ref 0–37)
Albumin: 4.1 g/dL (ref 3.5–5.2)
Alkaline Phosphatase: 72 U/L (ref 39–117)
BUN: 22 mg/dL (ref 6–23)
CO2: 30 mEq/L (ref 19–32)
Calcium: 9.2 mg/dL (ref 8.4–10.5)
Chloride: 107 mEq/L (ref 96–112)
Creatinine, Ser: 0.85 mg/dL (ref 0.40–1.50)
GFR: 86.49 mL/min (ref >60.00)
Glucose, Bld: 96 mg/dL (ref 70–99)
Potassium: 4.8 mEq/L (ref 3.5–5.1)
Sodium: 143 mEq/L (ref 135–145)
Total Bilirubin: 0.4 mg/dL (ref 0.2–1.2)
Total Protein: 6.4 g/dL (ref 6.0–8.3)

## 2019-02-26 LAB — LIPID PANEL
Cholesterol: 112 mg/dL (ref 0–200)
HDL: 43.2 mg/dL (ref >39.00)
LDL Cholesterol: 59 mg/dL (ref 0–99)
NonHDL: 68.66
Total CHOL/HDL Ratio: 3
Triglycerides: 48 mg/dL (ref 0.0–149.0)
VLDL: 9.6 mg/dL (ref 0.0–40.0)

## 2019-02-26 LAB — PSA, MEDICARE: PSA: 9.73 ng/ml — ABNORMAL HIGH (ref 0.10–4.00)

## 2019-02-27 ENCOUNTER — Other Ambulatory Visit: Payer: Self-pay

## 2019-02-27 ENCOUNTER — Ambulatory Visit (INDEPENDENT_AMBULATORY_CARE_PROVIDER_SITE_OTHER): Payer: Medicare Other

## 2019-02-27 DIAGNOSIS — Z0000 Encounter for general adult medical examination without abnormal findings: Secondary | ICD-10-CM | POA: Diagnosis not present

## 2019-02-27 NOTE — Progress Notes (Signed)
Subjective:   Victor Ball is a 79 y.o. male who presents for Medicare Annual/Subsequent preventive examination.  Review of Systems: N/A   This visit is being conducted through telemedicine via telephone at the nurse health advisor's home address due to the COVID-19 pandemic. This patient has given me verbal consent via doximity to conduct this visit, patient states they are participating from their home address. Patient and myself are on the telephone call. There is no referral for this visit. Some vital signs may be absent or patient reported.    Patient identification: identified by name, DOB, and current address   Cardiac Risk Factors include: advanced age (>61men, >52 women);hypertension;dyslipidemia;male gender     Objective:    Vitals: There were no vitals taken for this visit.  There is no height or weight on file to calculate BMI.  Advanced Directives 02/27/2019 02/20/2018 09/14/2017 09/02/2017 02/18/2017 11/10/2016 10/29/2016  Does Patient Have a Medical Advance Directive? Yes Yes No No Yes Yes Yes  Type of Paramedic of [place];Living will [place];Living will - - [place];Living will [place];Living will [place];Living will  Does patient want to make changes to medical advance directive? - - - - - No - Patient declined No - Patient declined  Copy of [place] in Chart? Yes - validated most recent copy scanned in chart (See row information) Yes - validated most recent copy scanned in chart (See row information) - - Yes Yes Yes  Would patient like information on creating a medical advance directive? - - No - Patient declined No - Patient declined - - -    Tobacco Social History   Tobacco Use  Smoking Status Former Smoker  . Quit date: 01/18/1973  . Years since quitting: 46.1  Smokeless Tobacco Never Used     Counseling given: Not Answered   Clinical  Intake:  Pre-visit preparation completed: Yes  Pain : No/denies pain     Nutritional Risks: None Diabetes: No  How often do you need to have someone help you when you read instructions, pamphlets, or other written materials from your doctor or pharmacy?: 1 - Never What is the last grade level you completed in school?: 2 years of college  Interpreter Needed?: No  Information entered by :: CJohnson, LPN  Past Medical History:  Diagnosis Date  . Hepatitis C, chronic ([place])    treated prev with Harvoni, tested and cleared  . History of kidney stones   . Hyperlipidemia   . Hypertension    Past Surgical History:  Procedure Laterality Date  . APPENDECTOMY  1948  . JOINT REPLACEMENT    . LIVER BIOPSY    . PROSTATE BIOPSY     neg, x2  . TOE FUSION  1985   left great toe  . TONSILLECTOMY AND ADENOIDECTOMY  1945  . TOTAL HIP ARTHROPLASTY Left 11/09/2016   Procedure: LEFT TOTAL HIP ARTHROPLASTY ANTERIOR APPROACH;  Surgeon: Mcarthur Rossetti, MD;  Location: [facility];  Service: Orthopedics;  Laterality: Left;  . TOTAL HIP ARTHROPLASTY Right 09/13/2017   Procedure: RIGHT TOTAL HIP ARTHROPLASTY ANTERIOR APPROACH;  Surgeon: Mcarthur Rossetti, MD;  Location: [facility];  Service: Orthopedics;  Laterality: Right;  Marland Kitchen VASECTOMY     Family History  Problem Relation Age of Onset  . Heart disease Father   . Diabetes Father   . Heart attack Father   . Celiac disease Sister   .  Heart disease Brother        MI then stents  . Heart attack Brother   . Stroke Brother   . Cancer Paternal Aunt   . Prostate cancer Neg Hx   . Colon cancer Neg Hx    Social History   Socioeconomic History  . Marital status: Married    Spouse name: Not on file  . Number of children: 2  . Years of education: Not on file  . Highest education level: Not on file  Occupational History  . Occupation: Insurance agent---retired    Comment: [employer] and worker's comp  Tobacco Use  . Smoking  status: Former Smoker    Quit date: 01/18/1973    Years since quitting: 46.1  . Smokeless tobacco: Never Used  Substance and Sexual Activity  . Alcohol use: Yes    Alcohol/week: 7.0 standard drinks    Types: 4 Glasses of wine, 3 Standard drinks or equivalent per week    Comment: 1 drink a day - either wine or cocktail  . Drug use: No  . Sexual activity: Yes  Other Topics Concern  . Not on file  Social History Narrative   2 daughters   Married 1961   Partially retired from Insurance underwriter, still consulting some as of 2020      Has living will   Wife, then daughters equally if wife were incapacitated, has health care POA.   Would accept resuscitation attempts   Not sure about tube feeds   Social Determinants of Health   Financial Resource Strain: Low Risk   . Difficulty of Paying Living Expenses: Not hard at all  Food Insecurity: No Food Insecurity  . Worried About Charity fundraiser in the Last Year: Never true  . Ran Out of Food in the Last Year: Never true  Transportation Needs: No Transportation Needs  . Lack of Transportation (Medical): No  . Lack of Transportation (Non-Medical): No  Physical Activity: Sufficiently Active  . Days of Exercise per Week: 3 days  . Minutes of Exercise per Session: 60 min  Stress: No Stress Concern Present  . Feeling of Stress : Not at all  Social Connections:   . Frequency of Communication with Friends and Family: Not on file  . Frequency of Social Gatherings with Friends and Family: Not on file  . Attends Religious Services: Not on file  . Active Member of Clubs or Organizations: Not on file  . Attends Archivist Meetings: Not on file  . Marital Status: Not on file    Outpatient Encounter Medications as of 02/27/2019  Medication Sig  . aspirin EC 81 MG tablet Take 81 mg by mouth daily.  Marland Kitchen atorvastatin (LIPITOR) 10 MG tablet TAKE ONE TABLET BY MOUTH DAILY  . lisinopril (ZESTRIL) 5 MG tablet TAKE ONE TABLET BY MOUTH DAILY  . Misc  Natural Products (TART CHERRY ADVANCED PO) Take 3,000 mg by mouth daily.  . Multiple Vitamins-Minerals (MATURE ADULT CENTURY PO) Take 1 tablet by mouth daily.   . Omega-3 Fatty Acids (EQL OMEGA 3 FISH OIL) 1400 MG CAPS Take 1,400 mg by mouth daily.  . Turmeric Curcumin 500 MG CAPS Take 500 mg by mouth daily.   Marland Kitchen PUMPKIN SEED PO Take 262 mg by mouth daily.   No facility-administered encounter medications on file as of 02/27/2019.    Activities of Daily Living In your present state of health, do you have any difficulty performing the following activities: 02/27/2019  Hearing? N  Vision? N  Difficulty concentrating or making decisions? N  Walking or climbing stairs? N  Dressing or bathing? N  Doing errands, shopping? N  Preparing Food and eating ? N  Using the Toilet? N  In the past six months, have you accidently leaked urine? N  Do you have problems with loss of bowel control? N  Managing your Medications? N  Managing your Finances? N  Housekeeping or managing your Housekeeping? N  Some recent data might be hidden    Patient Care Team: Tonia Ghent, MD as PCP - General (Family Medicine) Mcarthur Rossetti, MD as Consulting Physician (Orthopedic Surgery) Lottie Dawson., MD as Referring Physician (Dentistry) Ralene Bathe, MD as Referring Physician (Dermatology)   Assessment:   This is a routine wellness examination for [place].  Exercise Activities and Dietary recommendations Current Exercise Habits: Structured exercise class, Type of exercise: strength training/weights;treadmill, Time (Minutes): 60, Frequency (Times/Week): 3, Weekly Exercise (Minutes/Week): 180, Intensity: Moderate, Exercise limited by: None identified  Goals    . Increase physical activity     Starting 02/20/2018, I will continue to do Silver Sneakers for 45 minutes 3 days per week, to do cardio for 30 minutes 3 days per week, to play tennis for 2 hours weekly, and to play golf and to hike as  weather permits.     . Patient Stated     02/27/2019, I will continue exercising at the hospital gym 3 times a week for at least 60 minutes.       Fall Risk Fall Risk  02/27/2019 02/20/2018 02/18/2017 02/17/2016 02/17/2015  Falls in the past year? 0 0 No No No  Number falls in past yr: 0 - - - -  Injury with Fall? 0 - - - -  Risk for fall due to : No Fall Risks - - - -  Follow up Falls evaluation completed;Falls prevention discussed - - - -   Is the patient's home free of loose throw rugs in walkways, pet beds, electrical cords, etc?   yes      Grab bars in the bathroom? no      Handrails on the stairs?   yes      Adequate lighting?   yes  Timed Get Up and Go Performed: N/A  Depression Screen PHQ 2/9 Scores 02/27/2019 02/20/2018 02/18/2017 02/17/2016  PHQ - 2 Score 0 0 0 0  PHQ- 9 Score 0 0 0 -    Cognitive Function MMSE - Mini Mental State Exam 02/27/2019 02/20/2018 02/18/2017 02/17/2016  Orientation to time 5 5 5 5   Orientation to Place 5 5 5 5   Registration 3 3 3 3   Attention/ Calculation 5 0 0 0  Recall 3 3 3 3   Language- name 2 objects - 0 0 0  Language- repeat 1 1 1 1   Language- follow 3 step command - 3 3 3   Language- read & follow direction - 0 0 0  Write a sentence - 0 0 0  Copy design - 0 0 0  Total score - 20 20 20   Mini Cog  Mini-Cog screen was completed. Maximum score is 22. A value of 0 denotes this part of the MMSE was not completed or the patient failed this part of the Mini-Cog screening.       Immunization History  Administered Date(s) Administered  . Fluad Quad(high Dose 65+) 09/15/2018  . Influenza Split 10/06/2011  . Influenza,inj,Quad PF,6+ Mos 10/04/2012, 12/28/2013, 12/11/2014, 12/10/2015, 10/11/2016, 10/27/2017  .  Influenza-Unspecified 11/04/2008, 12/02/2010  . Pneumococcal Conjugate-13 08/15/2014  . Pneumococcal Polysaccharide-23 01/19/2003, 07/15/2003, 06/16/2012  . Td 07/15/2003, 10/24/2012  . Tdap 01/19/2003  . Zoster 01/19/2007, 10/25/2007     Qualifies for Shingles Vaccine? Yes  Screening Tests Health Maintenance  Topic Date Due  . TETANUS/TDAP  10/25/2022  . INFLUENZA VACCINE  Completed  . PNA vac Low Risk Adult  Completed   Cancer Screenings: Lung: Low Dose CT Chest recommended if Age 69-80 years, 30 pack-year currently smoking OR have quit w/in 15years. Patient does not qualify. Colorectal: no longer required  Additional Screenings:  Hepatitis C Screening: N/A      Plan:   Patient will continue exercising at the hospital gym 3 times a week for at least 60 minutes.  I have personally reviewed and noted the following in the patient's chart:   . Medical and social history . Use of alcohol, tobacco or illicit drugs  . Current medications and supplements . Functional ability and status . Nutritional status . Physical activity . Advanced directives . List of other physicians . Hospitalizations, surgeries, and ER visits in previous 12 months . Vitals . Screenings to include cognitive, depression, and falls . Referrals and appointments  In addition, I have reviewed and discussed with patient certain preventive protocols, quality metrics, and best practice recommendations. A written personalized care plan for preventive services as well as general preventive health recommendations were provided to patient.     Andrez Grime, LPN  [id number]

## 2019-02-27 NOTE — Patient Instructions (Signed)
Victor Ball , Thank you for taking time to come for your Medicare Wellness Visit. I appreciate your ongoing commitment to your health goals. Please review the following plan we discussed and let me know if I can assist you in the future.   Screening recommendations/referrals: Colonoscopy: no longer required Recommended yearly ophthalmology/optometry visit for glaucoma screening and checkup Recommended yearly dental visit for hygiene and checkup  Vaccinations: Influenza vaccine: Up to date, completed 09/15/2018 Pneumococcal vaccine: Completed series Tdap vaccine: Up to date, completed 10/24/2012 Shingles vaccine: discussed    Advanced directives: copy in chart per patient   Conditions/risks identified: hypertension, hyperlipidemia  Next appointment: 03/05/2019 @ 12 pm   Preventive Care 49 Years and Older, Male Preventive care refers to lifestyle choices and visits with your health care provider that can promote health and wellness. What does preventive care include?  A yearly physical exam. This is also called an annual well check.  Dental exams once or twice a year.  Routine eye exams. Ask your health care provider how often you should have your eyes checked.  Personal lifestyle choices, including:  Daily care of your teeth and gums.  Regular physical activity.  Eating a healthy diet.  Avoiding tobacco and drug use.  Limiting alcohol use.  Practicing safe sex.  Taking low doses of aspirin every day.  Taking vitamin and mineral supplements as recommended by your health care provider. What happens during an annual well check? The services and screenings done by your health care provider during your annual well check will depend on your age, overall health, lifestyle risk factors, and family history of disease. Counseling  Your health care provider may ask you questions about your:  Alcohol use.  Tobacco use.  Drug use.  Emotional well-being.  Home and relationship  well-being.  Sexual activity.  Eating habits.  History of falls.  Memory and ability to understand (cognition).  Work and work Statistician. Screening  You may have the following tests or measurements:  Height, weight, and BMI.  Blood pressure.  Lipid and cholesterol levels. These may be checked every 5 years, or more frequently if you are over 74 years old.  Skin check.  Lung cancer screening. You may have this screening every year starting at age 62 if you have a 30-pack-year history of smoking and currently smoke or have quit within the past 15 years.  Fecal occult blood test (FOBT) of the stool. You may have this test every year starting at age 73.  Flexible sigmoidoscopy or colonoscopy. You may have a sigmoidoscopy every 5 years or a colonoscopy every 10 years starting at age 59.  Prostate cancer screening. Recommendations will vary depending on your family history and other risks.  Hepatitis C blood test.  Hepatitis B blood test.  Sexually transmitted disease (STD) testing.  Diabetes screening. This is done by checking your blood sugar (glucose) after you have not eaten for a while (fasting). You may have this done every 1-3 years.  Abdominal aortic aneurysm (AAA) screening. You may need this if you are a current or former smoker.  Osteoporosis. You may be screened starting at age 47 if you are at high risk. Talk with your health care provider about your test results, treatment options, and if necessary, the need for more tests. Vaccines  Your health care provider may recommend certain vaccines, such as:  Influenza vaccine. This is recommended every year.  Tetanus, diphtheria, and acellular pertussis (Tdap, Td) vaccine. You may need a Td booster  every 10 years.  Zoster vaccine. You may need this after age 18.  Pneumococcal 13-valent conjugate (PCV13) vaccine. One dose is recommended after age 67.  Pneumococcal polysaccharide (PPSV23) vaccine. One dose is  recommended after age 15. Talk to your health care provider about which screenings and vaccines you need and how often you need them. This information is not intended to replace advice given to you by your health care provider. Make sure you discuss any questions you have with your health care provider. Document Released: 01/31/2015 Document Revised: 09/24/2015 Document Reviewed: 11/05/2014 Elsevier Interactive Patient Education  2017 [place] Prevention in the Home Falls can cause injuries. They can happen to people of all ages. There are many things you can do to make your home safe and to help prevent falls. What can I do on the outside of my home?  Regularly fix the edges of walkways and driveways and fix any cracks.  Remove anything that might make you trip as you walk through a door, such as a raised step or threshold.  Trim any bushes or trees on the path to your home.  Use bright outdoor lighting.  Clear any walking paths of anything that might make someone trip, such as rocks or tools.  Regularly check to see if handrails are loose or broken. Make sure that both sides of any steps have handrails.  Any raised decks and porches should have guardrails on the edges.  Have any leaves, snow, or ice cleared regularly.  Use sand or salt on walking paths during winter.  Clean up any spills in your garage right away. This includes oil or grease spills. What can I do in the bathroom?  Use night lights.  Install grab bars by the toilet and in the tub and shower. Do not use towel bars as grab bars.  Use non-skid mats or decals in the tub or shower.  If you need to sit down in the shower, use a plastic, non-slip stool.  Keep the floor dry. Clean up any water that spills on the floor as soon as it happens.  Remove soap buildup in the tub or shower regularly.  Attach bath mats securely with double-sided non-slip rug tape.  Do not have throw rugs and other things on  the floor that can make you trip. What can I do in the bedroom?  Use night lights.  Make sure that you have a light by your bed that is easy to reach.  Do not use any sheets or blankets that are too big for your bed. They should not hang down onto the floor.  Have a firm chair that has side arms. You can use this for support while you get dressed.  Do not have throw rugs and other things on the floor that can make you trip. What can I do in the kitchen?  Clean up any spills right away.  Avoid walking on wet floors.  Keep items that you use a lot in easy-to-reach places.  If you need to reach something above you, use a strong step stool that has a grab bar.  Keep electrical cords out of the way.  Do not use floor polish or wax that makes floors slippery. If you must use wax, use non-skid floor wax.  Do not have throw rugs and other things on the floor that can make you trip. What can I do with my stairs?  Do not leave any items on the stairs.  Make  sure that there are handrails on both sides of the stairs and use them. Fix handrails that are broken or loose. Make sure that handrails are as long as the stairways.  Check any carpeting to make sure that it is firmly attached to the stairs. Fix any carpet that is loose or worn.  Avoid having throw rugs at the top or bottom of the stairs. If you do have throw rugs, attach them to the floor with carpet tape.  Make sure that you have a light switch at the top of the stairs and the bottom of the stairs. If you do not have them, ask someone to add them for you. What else can I do to help prevent falls?  Wear shoes that:  Do not have high heels.  Have rubber bottoms.  Are comfortable and fit you well.  Are closed at the toe. Do not wear sandals.  If you use a stepladder:  Make sure that it is fully opened. Do not climb a closed stepladder.  Make sure that both sides of the stepladder are locked into place.  Ask someone to  hold it for you, if possible.  Clearly mark and make sure that you can see:  Any grab bars or handrails.  First and last steps.  Where the edge of each step is.  Use tools that help you move around (mobility aids) if they are needed. These include:  Canes.  Walkers.  Scooters.  Crutches.  Turn on the lights when you go into a dark area. Replace any light bulbs as soon as they burn out.  Set up your furniture so you have a clear path. Avoid moving your furniture around.  If any of your floors are uneven, fix them.  If there are any pets around you, be aware of where they are.  Review your medicines with your doctor. Some medicines can make you feel dizzy. This can increase your chance of falling. Ask your doctor what other things that you can do to help prevent falls. This information is not intended to replace advice given to you by your health care provider. Make sure you discuss any questions you have with your health care provider. Document Released: 10/31/2008 Document Revised: 06/12/2015 Document Reviewed: 02/08/2014 Elsevier Interactive Patient Education  2017 [employer].

## 2019-02-27 NOTE — Progress Notes (Signed)
PCP notes:  Health Maintenance: No gaps noted   Abnormal Screenings: none   Patient concerns: Discuss PSA levels Discuss having Cologuard done   Nurse concerns: none   Next PCP appt.: 03/05/2019 @ 12 pm

## 2019-03-05 ENCOUNTER — Ambulatory Visit (INDEPENDENT_AMBULATORY_CARE_PROVIDER_SITE_OTHER): Payer: Medicare Other | Admitting: Family Medicine

## 2019-03-05 ENCOUNTER — Other Ambulatory Visit: Payer: Self-pay

## 2019-03-05 ENCOUNTER — Encounter: Payer: Self-pay | Admitting: Family Medicine

## 2019-03-05 VITALS — BP 128/82 | HR 57 | Temp 96.3°F | Ht 73.0 in | Wt 209.1 lb

## 2019-03-05 DIAGNOSIS — Z7189 Other specified counseling: Secondary | ICD-10-CM

## 2019-03-05 DIAGNOSIS — R1310 Dysphagia, unspecified: Secondary | ICD-10-CM

## 2019-03-05 DIAGNOSIS — I779 Disorder of arteries and arterioles, unspecified: Secondary | ICD-10-CM | POA: Diagnosis not present

## 2019-03-05 DIAGNOSIS — Z0000 Encounter for general adult medical examination without abnormal findings: Secondary | ICD-10-CM

## 2019-03-05 DIAGNOSIS — R972 Elevated prostate specific antigen [PSA]: Secondary | ICD-10-CM

## 2019-03-05 DIAGNOSIS — E782 Mixed hyperlipidemia: Secondary | ICD-10-CM

## 2019-03-05 DIAGNOSIS — I10 Essential (primary) hypertension: Secondary | ICD-10-CM

## 2019-03-05 MED ORDER — LISINOPRIL 5 MG PO TABS: 5.0000 mg | ORAL_TABLET | Freq: Every day | ORAL | 3 refills | Status: DC

## 2019-03-05 MED ORDER — ATORVASTATIN CALCIUM 10 MG PO TABS: 10.0000 mg | ORAL_TABLET | Freq: Every day | ORAL | 3 refills | Status: DC

## 2019-03-05 NOTE — Progress Notes (Signed)
This visit occurred during the SARS-CoV-2 public health emergency.  Safety protocols were in place, including screening questions prior to the visit, additional usage of staff PPE, and extensive cleaning of exam room while observing appropriate contact time as indicated for disinfecting solutions.  He has intentional weight loss with diet and exercise.  Lower carb diet.  He cut out bread and rice.  He replaced with healthy foods. He is exercising on a treadmill and doing barbells/leg presses.  He has gotten down to 202 in the AM. No "B" sx.    Tetanus 2014 PNA up to date Flu 2020 Shingles deferred, dw pt.    [place], [place], 2021.   PSA elevation.  Prev neg bx x2.  He had friends with prostate cancer.  Prev 6.29 in 2012.  Currently 9.7.   Colon cancer screening 2012.   Living will d/w pt. Wife, then daughters equally if wife were incapacitated, if patient were incapacitated  Hypertension:  Using medication without problems or lightheadedness:yes Chest pain with exertion:no Edema:no Short of breath:no Average home BPs: 120/70-80s.   Recheck 128/82.    Elevated Cholesterol: Using medications without problems: yes Muscle aches: no Diet compliance: yes Exercise:yes Labs d/w pt.   In the last 90 days he had trouble with swallowing some of his larger pills.  He is eating food and liquids well.  No vomiting. Some feeling of throat irritation.    Coronary calcium score of 543. This was 59% percentile for age and sex matched control.  Reasonable to continue statin for risk factor reduction.  He has history of mild aortic root dilatation 3.9 cm.  PMH and SH reviewed  ROS: Per HPI unless specifically indicated in ROS section   Meds, vitals, and allergies reviewed.   GEN: nad, alert and oriented HEENT: ncat NECK: supple w/o LA CV: rrr. PULM: ctab, no inc wob ABD: soft, +bs EXT: no edema SKIN: no acute rash Defer DRE

## 2019-03-05 NOTE — Patient Instructions (Addendum)
We'll call about seeing urology and GI.  Don't change your meds for now.  Take care.  Glad to see you. Thanks for your effort.

## 2019-03-07 DIAGNOSIS — R1310 Dysphagia, unspecified: Secondary | ICD-10-CM | POA: Insufficient documentation

## 2019-03-07 DIAGNOSIS — I779 Disorder of arteries and arterioles, unspecified: Secondary | ICD-10-CM | POA: Insufficient documentation

## 2019-03-07 DIAGNOSIS — R1319 Other dysphagia: Secondary | ICD-10-CM | POA: Insufficient documentation

## 2019-03-07 NOTE — Assessment & Plan Note (Signed)
Coronary calcium score of 543. This was 59% percentile for age and sex matched control.  Reasonable to continue statin for risk factor reduction.  He has history of mild aortic root dilatation 3.9 cm.  See follow-up imaging orders to reevaluate his aortic root.

## 2019-03-07 NOTE — Assessment & Plan Note (Signed)
PSA elevation.  Prev neg bx x2.  He had friends with prostate cancer.  Prev 6.29 in 2012.  Currently 9.7.   I think it makes sense to talk to urology.  He agrees.  Refer.

## 2019-03-07 NOTE — Assessment & Plan Note (Signed)
Refer to GI 

## 2019-03-07 NOTE — Assessment & Plan Note (Signed)
Tetanus 2014 PNA up to date Flu 2020 Shingles deferred, dw pt.    [place], [place], 2021.   PSA elevation.  Prev neg bx x2.  He had friends with prostate cancer.  Prev 6.29 in 2012.  Currently 9.7.   Colon cancer screening 2012.   Living will d/w pt. Wife, then daughters equally if wife were incapacitated, if patient were incapacitated

## 2019-03-07 NOTE — Assessment & Plan Note (Signed)
Recheck 128/82.   No change in meds at this point.  Continue as is.  He agrees.

## 2019-03-07 NOTE — Assessment & Plan Note (Signed)
Living will d/w pt. Wife, then daughters equally if wife were incapacitated, if patient were incapacitated

## 2019-03-07 NOTE — Assessment & Plan Note (Signed)
Continue Lipitor.  Continue work on diet and exercise.  Intentional weight loss noted.

## 2019-03-08 IMAGING — RF DG HIP (WITH PELVIS) OPERATIVE*R*
1 series · 8 of 8 positions shown · non-contrast
Comparison: Limited views of the pelvis and both hips from a study
July 13, 2017

CLINICAL DATA: Status post anterior approach right total hip
arthroplasty.

EXAM:
OPERATIVE right HIP (WITH PELVIS IF PERFORMED) 6 VIEWS
TECHNIQUE: Fluoroscopic spot image(s) were submitted for interpretation
post-operatively. Reported fluoro time is 41 seconds.

[Series 1: run · 8 of 8 slices shown]
[im 1/8]
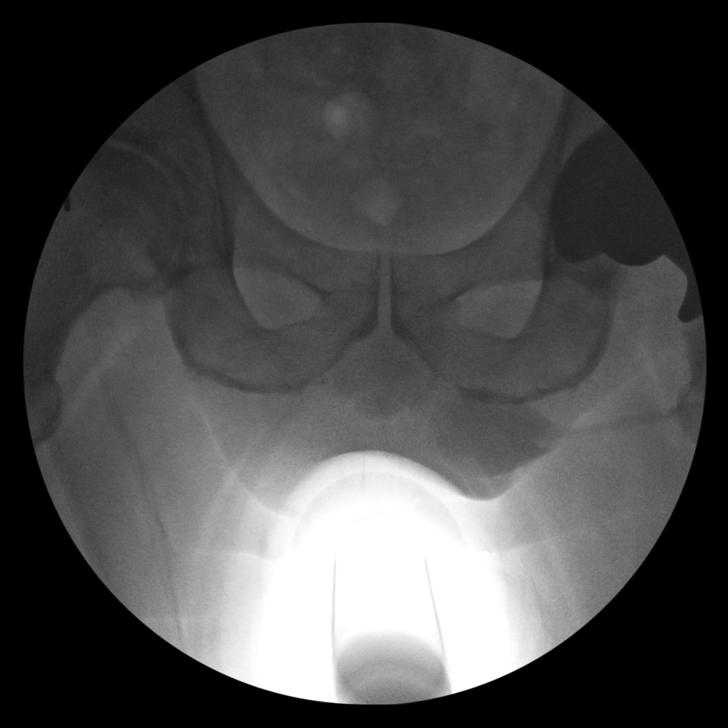
[im 2/8]
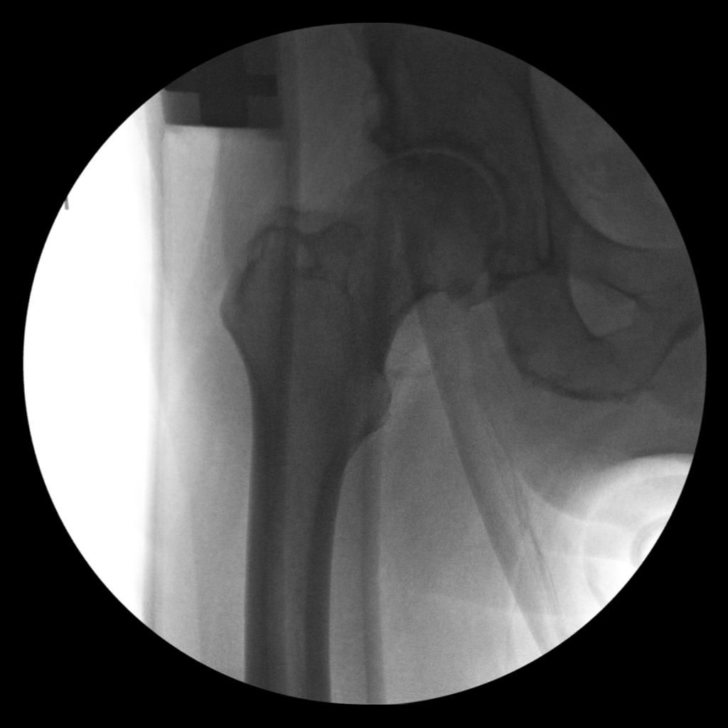
[im 3/8]
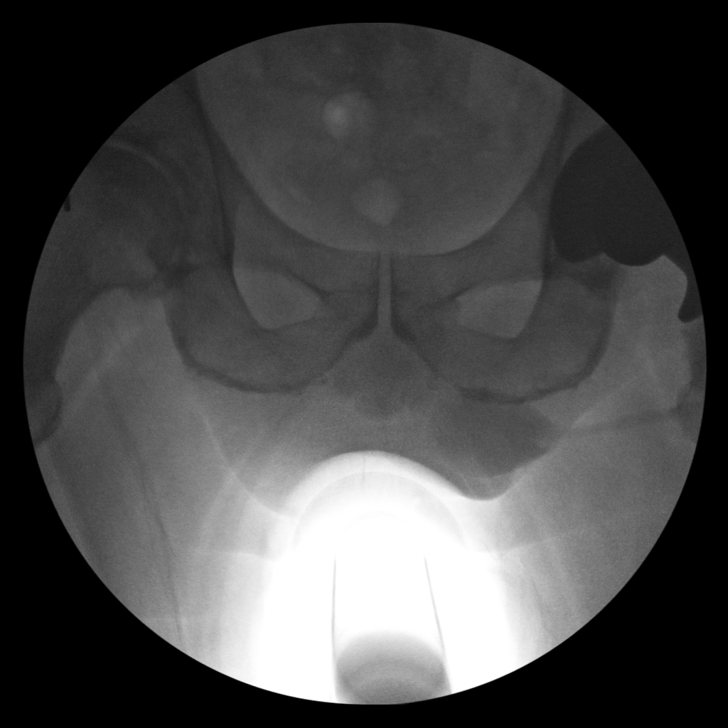
[im 4/8]
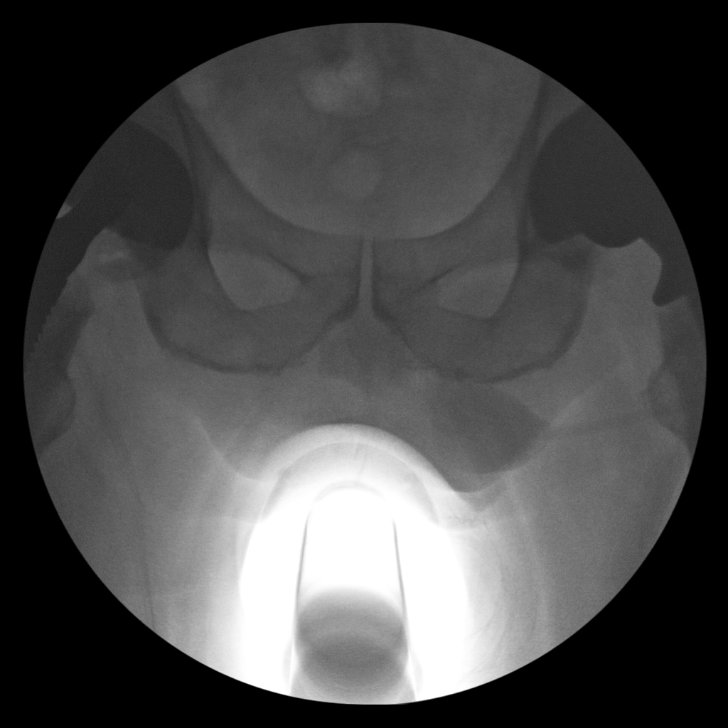
[im 5/8]
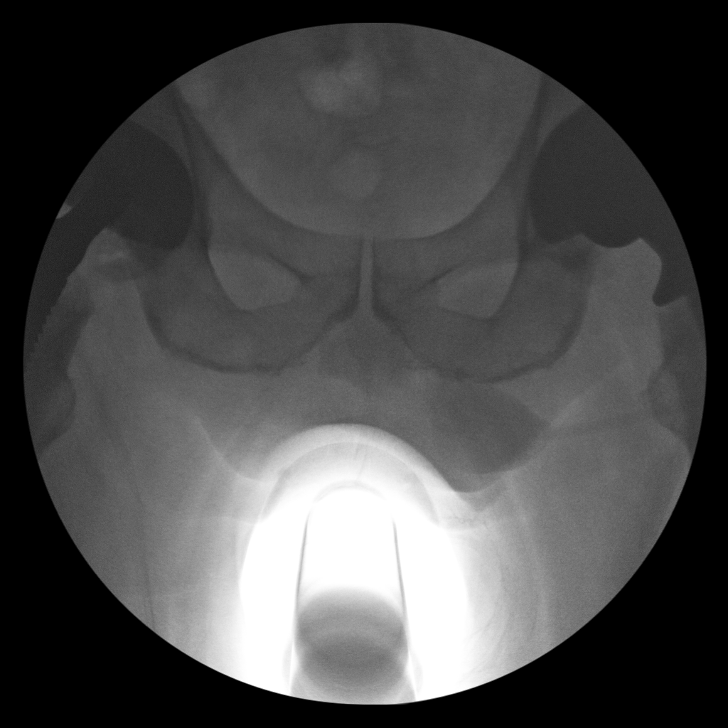
[im 6/8]
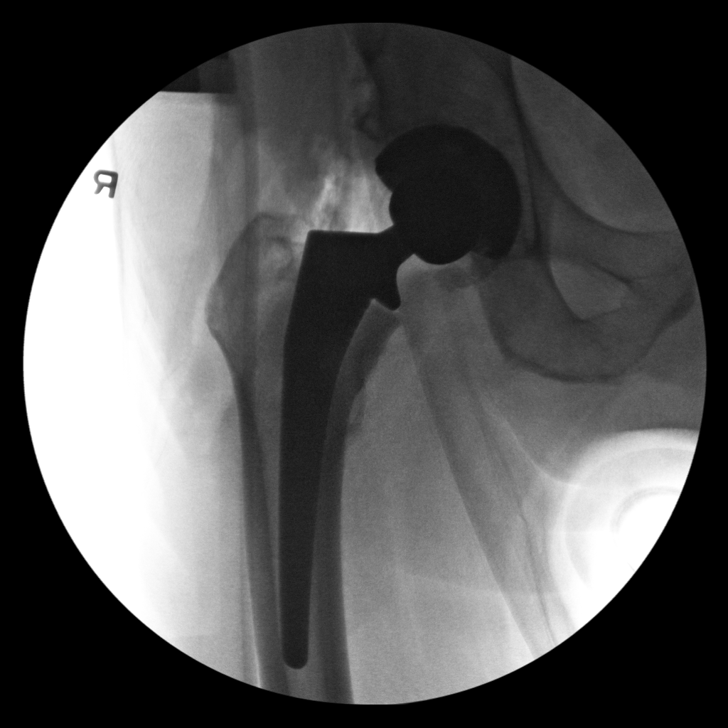
[im 7/8]
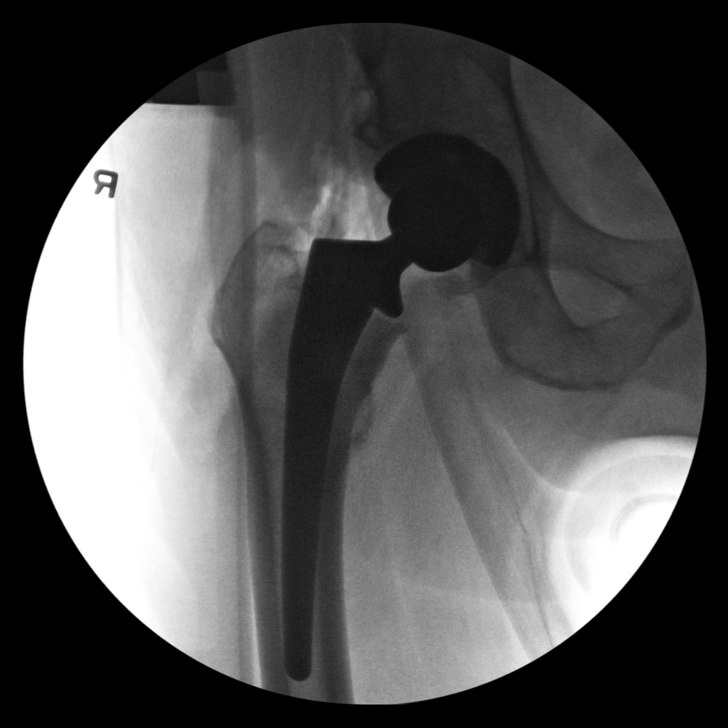
[im 8/8]
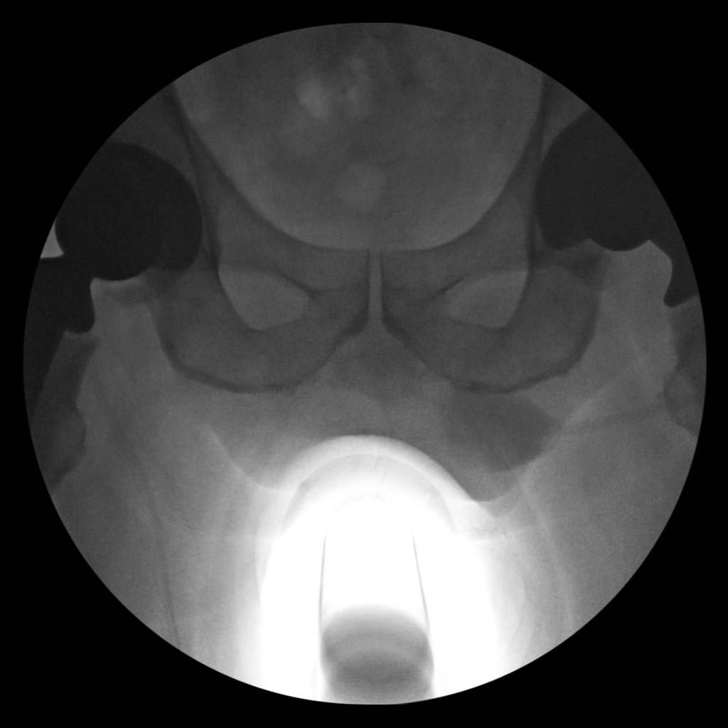

[8 of 8 positions shown; findings below may reference images not displayed]

FINDINGS: The images reveal a pre-existing left hip prosthesis. On the right
the images demonstrate the removal of the native femoral head and
neck and placement of the prosthetic components. Radiographic
positioning of the prosthetic components is good.
IMPRESSION: Intraoperative fluoro spot series revealing anterior approach right
total hip joint prosthesis placement. There is no immediate
postprocedure complication.

## 2019-03-15 ENCOUNTER — Ambulatory Visit (INDEPENDENT_AMBULATORY_CARE_PROVIDER_SITE_OTHER): Payer: Medicare Other | Admitting: Gastroenterology

## 2019-03-15 ENCOUNTER — Encounter: Payer: Self-pay | Admitting: Gastroenterology

## 2019-03-15 DIAGNOSIS — R0989 Other specified symptoms and signs involving the circulatory and respiratory systems: Secondary | ICD-10-CM

## 2019-03-15 DIAGNOSIS — B1920 Unspecified viral hepatitis C without hepatic coma: Secondary | ICD-10-CM

## 2019-03-15 DIAGNOSIS — R09A2 Foreign body sensation, throat: Secondary | ICD-10-CM

## 2019-03-15 MED ORDER — LANSOPRAZOLE 15 MG PO TBDD: 15.0000 mg | DELAYED_RELEASE_TABLET | Freq: Every morning | ORAL | 0 refills | Status: DC

## 2019-03-15 NOTE — Progress Notes (Signed)
Victor Ball [address]  [place]  [place], [place] [address]  Main: [phone]  Fax: [phone]   Gastroenterology Consultation  Referring Provider:     Tonia Ghent, MD Primary Care Physician:  Tonia Ghent, MD Reason for Consultation:   Dysphagia        HPI:   Virtual Visit via Video Note  I connected with patient on 03/15/19 at 11:30 AM EST by video ([email or web address]) and verified that I am speaking with the correct person using two identifiers.   I discussed the limitations, risks, security and privacy concerns of performing an evaluation and management service by video and the availability of in person appointments. I also discussed with the patient that there may be a patient responsible charge related to this service. The patient expressed understanding and agreed to proceed.  Location of the patient: Home Location of provider: Home Participating persons: Patient and provider only (Nursing staff checked in patient via phone but were not physically involved in the video interaction - see their notes)   History of Present Illness: Chief Complaint  Patient presents with  . New Patient (Initial Visit)  . Dysphagia    Only has trouble with large pills started 1-2 months ago. Patient states food and drinking are fine. Patient states he will have some soreness in throat some mornings.     Victor Ball is a 79 y.o. y/o male referred for consultation & management  by Dr. Damita Dunnings, Elveria Rising, MD.  Patient reports 38-month history of dysphagia to pills only.  Takes 2 prescription pills a day.  No dysphagia to solids or liquids whatsoever.  Has been on a diet for weight loss, which has included using vinegar multiple times a day.  Has had intentional 35 pound weight loss due to this.  Started the diet back in August 2020.  Denies any heartburn sensation.  Does report globus sensation however.  No prior upper endoscopy.  No nausea vomiting, altered bowel habits.  Last  colonoscopy 2012 was unremarkable  Also reports previous history of hepatitis C that was treated 3 to 4 years ago in [place] with Harvoni.  We do not have the records.  Past Medical History:  Diagnosis Date  . Hepatitis C, chronic ([place])    treated prev with Harvoni, tested and cleared  . History of kidney stones   . Hyperlipidemia   . Hypertension     Past Surgical History:  Procedure Laterality Date  . APPENDECTOMY  1948  . JOINT REPLACEMENT    . LIVER BIOPSY    . PROSTATE BIOPSY     neg, x2  . TOE FUSION  1985   left great toe  . TONSILLECTOMY AND ADENOIDECTOMY  1945  . TOTAL HIP ARTHROPLASTY Left 11/09/2016   Procedure: LEFT TOTAL HIP ARTHROPLASTY ANTERIOR APPROACH;  Surgeon: Mcarthur Rossetti, MD;  Location: [facility];  Service: Orthopedics;  Laterality: Left;  . TOTAL HIP ARTHROPLASTY Right 09/13/2017   Procedure: RIGHT TOTAL HIP ARTHROPLASTY ANTERIOR APPROACH;  Surgeon: Mcarthur Rossetti, MD;  Location: [facility];  Service: Orthopedics;  Laterality: Right;  Marland Kitchen VASECTOMY      Prior to Admission medications   Medication Sig Start Date End Date Taking? Authorizing Provider  Apple Cider Vinegar 600 MG CAPS Take 2,400 mg by mouth.   Yes [provider]  Ascorbic Acid (VITAMIN C) 1000 MG tablet Take 1,000 mg by mouth daily.   Yes [provider]  aspirin EC 81 MG  tablet Take 81 mg by mouth daily.   Yes [provider]  atorvastatin (LIPITOR) 10 MG tablet Take 1 tablet (10 mg total) by mouth daily. 03/05/19  Yes Tonia Ghent, MD  Cholecalciferol (VITAMIN D) 50 MCG (2000 UT) CAPS Take 4,000 Units by mouth.   Yes [provider]  lisinopril (ZESTRIL) 5 MG tablet Take 1 tablet (5 mg total) by mouth daily. 03/05/19  Yes Tonia Ghent, MD  Misc Natural Products (TART CHERRY ADVANCED PO) Take 3,000 mg by mouth daily.   Yes [provider]  Multiple Vitamins-Minerals (MATURE ADULT CENTURY PO) Take 1 tablet by mouth daily.    Yes  [provider]  Omega-3 Fatty Acids (EQL OMEGA 3 FISH OIL) 1400 MG CAPS Take 1,400 mg by mouth daily.   Yes [provider]  Turmeric Curcumin 500 MG CAPS Take 500 mg by mouth daily.    Yes [provider]  lansoprazole (PREVACID SOLUTAB) 15 MG disintegrating tablet Take 1 tablet (15 mg total) by mouth every morning. 03/15/19 04/14/19  Virgel Manifold, MD    Family History  Problem Relation Age of Onset  . Heart disease Father   . Diabetes Father   . Heart attack Father   . Celiac disease Sister   . Heart disease Brother        MI then stents  . Heart attack Brother   . Stroke Brother   . Cancer Paternal Aunt   . Prostate cancer Neg Hx   . Colon cancer Neg Hx      Social History   Tobacco Use  . Smoking status: Former Smoker    Quit date: 01/18/1973    Years since quitting: 46.1  . Smokeless tobacco: Never Used  Substance Use Topics  . Alcohol use: Yes    Alcohol/week: 7.0 standard drinks    Types: 4 Glasses of wine, 3 Standard drinks or equivalent per week    Comment: 1 drink a day - either wine or cocktail  . Drug use: No    Allergies as of 03/15/2019  . (No Known Allergies)    Review of Systems:    All systems reviewed and negative except where noted in HPI.   Observations/Objective:  Labs: CBC    Component Value Date/Time   WBC 9.8 09/14/2017 0401   RBC 4.31 09/14/2017 0401   HGB 13.5 09/14/2017 0401   HCT 40.0 09/14/2017 0401   PLT 156 09/14/2017 0401   MCV 92.8 09/14/2017 0401   MCH 31.3 09/14/2017 0401   MCHC 33.8 09/14/2017 0401   RDW 13.2 09/14/2017 0401   LYMPHSABS 2.1 12/28/2013 0817   MONOABS 0.7 12/28/2013 0817   EOSABS 0.1 12/28/2013 0817   BASOSABS 0.1 12/28/2013 0817   CMP     Component Value Date/Time   NA 143 02/26/2019 0820   K 4.8 02/26/2019 0820   CL 107 02/26/2019 0820   CO2 30 02/26/2019 0820   GLUCOSE 96 02/26/2019 0820   BUN 22 02/26/2019 0820   CREATININE 0.85 02/26/2019 0820   CALCIUM 9.2  02/26/2019 0820   PROT 6.4 02/26/2019 0820   PROT 6.8 10/07/2017 0722   ALBUMIN 4.1 02/26/2019 0820   ALBUMIN 4.3 10/07/2017 0722   AST 13 02/26/2019 0820   ALT 12 02/26/2019 0820   ALKPHOS 72 02/26/2019 0820   BILITOT 0.4 02/26/2019 0820   BILITOT 0.5 10/07/2017 0722   GFRNONAA >60 09/14/2017 0401   GFRAA >60 09/14/2017 0401    Imaging  Studies: No results found.  Assessment and Plan:   Victor Ball is a 79 y.o. y/o male has been referred for dysphagia  Assessment and Plan: Patient reports dysphagia to pills only and not to solid food or liquid foods.  Therefore I do not think that he is having true dysphagia, but rather globus sensation.  His pills do not get stuck to where he has to bring them back up.  He is able to swallow them down with water.  I do suspect that this is a result of his new diet with use of vinegar multiple times a day.  I have asked him to decrease this at least for the next 3 to 4 weeks to see if it helps with her symptoms.  We will also concurrently start a disintegrating oral liquid PPI for short-term, 3 to 4 weeks.  (Risks of PPI use were discussed with patient including bone loss, C. Diff diarrhea, pneumonia, infections, CKD, electrolyte abnormalities.  If clinically possible based on symptoms, goal would be to maintain patient on the lowest dose possible, or discontinue the medication with institution of acid reflux lifestyle modifications over time. Pt. Verbalizes understanding and chooses to continue the medication.)  I discussed the option of endoscopy to rule out any obstructive lesions, and we discussed the risks and benefits in detail.  Based on this discussion, patient agrees to let us know if symptoms are not better in 3 to 4 weeks and reassess at next appointment and if symptoms continue, consider upper endoscopy.  No alarm symptoms present at this time to indicate urgent procedure  We will also check hep C viral load once a year.  Has not had  this checked in the last year.  We will also try to obtain his previous records about his hep C treatment  Follow Up Instructions: 4 weeks  I discussed the assessment and treatment plan with the patient. The patient was provided an opportunity to ask questions and all were answered. The patient agreed with the plan and demonstrated an understanding of the instructions.   The patient was advised to call back or seek an in-person evaluation if the symptoms worsen or if the condition fails to improve as anticipated.  I provided 17 minutes of face-to-face time via video software during this encounter.  Additional time was spent in reviewing patient's chart, placing orders etc.   Virgel Manifold, MD  Speech recognition software was used to dictate the above note.

## 2019-03-15 NOTE — Addendum Note (Signed)
Addended by: [name] on: 03/15/2019 12:14 PM   Modules accepted: Orders

## 2019-03-16 NOTE — Addendum Note (Signed)
Addended by: Ulyess Blossom L on: 03/16/2019 09:40 AM   Modules accepted: Orders

## 2019-03-19 ENCOUNTER — Telehealth: Payer: Self-pay

## 2019-03-19 NOTE — Telephone Encounter (Signed)
Patient is calling because insurance approved the Lansoprazole 15 dissolve tablets. He states his copay is 140 dollars and can not afford that. Patient states he would be able to swallow the pill if the regular lansoprazole can be called in for him

## 2019-03-19 NOTE — Telephone Encounter (Signed)
Did PA on Lansoprazole 15mg  DR Dispersible tablets through cover my meds

## 2019-03-20 ENCOUNTER — Ambulatory Visit
Admission: RE | Admit: 2019-03-20 | Discharge: 2019-03-20 | Disposition: A | Discharge status: Home / Self Care | Payer: Medicare Other | Source: Ambulatory Visit | Attending: Family Medicine | Admitting: Family Medicine

## 2019-03-20 ENCOUNTER — Other Ambulatory Visit: Payer: Self-pay

## 2019-03-20 DIAGNOSIS — I359 Nonrheumatic aortic valve disorder, unspecified: Secondary | ICD-10-CM | POA: Diagnosis not present

## 2019-03-20 DIAGNOSIS — I700 Atherosclerosis of aorta: Secondary | ICD-10-CM | POA: Diagnosis not present

## 2019-03-20 DIAGNOSIS — I779 Disorder of arteries and arterioles, unspecified: Secondary | ICD-10-CM | POA: Diagnosis not present

## 2019-03-20 MED ORDER — IOHEXOL 350 MG/ML SOLN
75.0000 mL | Freq: Once | INTRAVENOUS | Status: AC | PRN
  Administered 2019-03-20: 75 mL via INTRAVENOUS

## 2019-03-20 NOTE — Telephone Encounter (Signed)
Patient verbalized understanding and will get this over the counter

## 2019-03-23 ENCOUNTER — Encounter: Payer: Self-pay | Admitting: Family Medicine

## 2019-04-04 ENCOUNTER — Ambulatory Visit: Payer: Self-pay | Admitting: Urology

## 2019-04-10 LAB — HCV RNA QUANT: Hepatitis C Quantitation: NOT DETECTED IU/mL

## 2019-04-10 NOTE — Progress Notes (Signed)
04/11/19 4:20 PM   Victor Ball [birth year] [id number]  Referring provider: Tonia Ghent, MD [address] [place],  [place] [address]  Chief Complaint  Patient presents with  . Elevated PSA    HPI: Victor Ball is a 79 y.o. white M who presents today for the evaluation and management of elevated PSA.   He had 2 negative prostate biopsies in the past with his first prostate bx being 19-20 years ago and second one performed in [place] 10 years ago. He reports PSA of 5 during prostate biopsy however he is not completely sure.   He had 3 close friends with prostate cancer so he requested a PSA test. Most recent PSA 9.7 from previous PSA of 6.29 in 2012.   He reports of no bothersome urinary symptoms including infections and gross hematuria. He denies bone pain. He lost 30 Ibs on a slow carb diet. He believes his life expectancy is 81 y/o based on insurance claims.   Former smoker. He quit smoking 46 years ago.   PMH: Past Medical History:  Diagnosis Date  . Hepatitis C, chronic ([facility])    treated prev with Harvoni, tested and cleared  . History of kidney stones   . Hyperlipidemia   . Hypertension     Surgical History: Past Surgical History:  Procedure Laterality Date  . APPENDECTOMY  1948  . JOINT REPLACEMENT    . LIVER BIOPSY    . PROSTATE BIOPSY     neg, x2  . TOE FUSION  1985   left great toe  . TONSILLECTOMY AND ADENOIDECTOMY  1945  . TOTAL HIP ARTHROPLASTY Left 11/09/2016   Procedure: LEFT TOTAL HIP ARTHROPLASTY ANTERIOR APPROACH;  Surgeon: Mcarthur Rossetti, MD;  Location: [facility];  Service: Orthopedics;  Laterality: Left;  . TOTAL HIP ARTHROPLASTY Right 09/13/2017   Procedure: RIGHT TOTAL HIP ARTHROPLASTY ANTERIOR APPROACH;  Surgeon: Mcarthur Rossetti, MD;  Location: [facility];  Service: Orthopedics;  Laterality: Right;  Marland Kitchen VASECTOMY      Home Medications:  Allergies as of 04/11/2019   No Known Allergies     Medication List       Accurate  as of April 11, 2019 11:59 PM. If you have any questions, ask your nurse or doctor.        Apple Cider Vinegar 600 MG Caps Take 2,400 mg by mouth.   aspirin EC 81 MG tablet Take 81 mg by mouth daily.   atorvastatin 10 MG tablet Commonly known as: LIPITOR Take 1 tablet (10 mg total) by mouth daily.   EQL Omega 3 Fish Oil 1400 MG Caps Take 1,400 mg by mouth daily.   lansoprazole 15 MG disintegrating tablet Commonly known as: PREVACID SOLUTAB Take 1 tablet (15 mg total) by mouth every morning.   lisinopril 5 MG tablet Commonly known as: ZESTRIL Take 1 tablet (5 mg total) by mouth daily.   MATURE ADULT CENTURY PO Take 1 tablet by mouth daily.   TART CHERRY ADVANCED PO Take 3,000 mg by mouth daily.   Turmeric Curcumin 500 MG Caps Take 500 mg by mouth daily.   vitamin C 1000 MG tablet Take 1,000 mg by mouth daily.   Vitamin D 50 MCG (2000 UT) Caps Take 4,000 Units by mouth.       Allergies: No Known Allergies  Family History: Family History  Problem Relation Age of Onset  . Heart disease Father   . Diabetes Father   . Heart attack  Father   . Celiac disease Sister   . Heart disease Brother        MI then stents  . Heart attack Brother   . Stroke Brother   . Cancer Paternal Aunt   . Prostate cancer Neg Hx   . Colon cancer Neg Hx     Social History:  reports that he quit smoking about 46 years ago. He has never used smokeless tobacco. He reports current alcohol use of about 7.0 standard drinks of alcohol per week. He reports that he does not use drugs.   Physical Exam: BP (!) 143/78   Pulse 64   Ht 6\' 1"  (1.854 m)   Wt 209 lb (94.8 kg)   BMI 27.57 kg/m   Constitutional:  Alert and oriented, No acute distress. HEENT: [place] AT, moist mucus membranes.  Trachea midline, no masses. Cardiovascular: No clubbing, cyanosis, or edema. Respiratory: Normal respiratory effort, no increased work of breathing. Rectal: External hemorrhoid. Normal sphincter tone, 50+ g  prostate with prominent lateral lobes, slightly firm but asymmetric, no nodules and tenderness.  Skin: No rashes, bruises or suspicious lesions. Neurologic: Grossly intact, no focal deficits, moving all 4 extremities. Psychiatric: Normal mood and affect.  Assessment & Plan:    1. Elevated PSA  Repeated PSA today, will call with results   We reviewed the implications of an elevated PSA and the uncertainty surrounding it. In general, a man's PSA increases with age and is produced by both normal and cancerous prostate tissue. Differential for elevated PSA is BPH, prostate cancer, infection, recent intercourse/ejaculation, prostate infarction, recent urethroscopic manipulation (foley placement/cystoscopy) and prostatitis. Management of an elevated PSA can include observation or prostate biopsy and wediscussed this in detail.  We discussed that indications for prostate biopsy are defined by age and race specific PSA cutoffs as well as a PSA velocity of 0.75/year.  PSA velocity of 3 for 10 years which is reassuring   Based on glances, PSA may be appropriate  Discussed active surveillance vs prostate MRI followed by fusion biopsy if needed Follow up with PSA in 6 months   Repeat pending today, as long as this is the same or lower, we will plan for surveillance.  If it is rising, consider MRI.  2. BPH w/o LUTS DRE today indicated prostatomegaly otherwise asymptomatic  Will get records form previous provider    F/u 6 months with psa prior  [facility] [address], [place],  [address] [phone]  I, Lucas Mallow, am acting as a scribe for Dr. Hollice Espy,  I have reviewed the above documentation for accuracy and completeness, and I agree with the above.   Hollice Espy, MD

## 2019-04-11 ENCOUNTER — Encounter: Payer: Self-pay | Admitting: Urology

## 2019-04-11 ENCOUNTER — Other Ambulatory Visit: Payer: Self-pay

## 2019-04-11 ENCOUNTER — Ambulatory Visit: Payer: Medicare Other | Admitting: Urology

## 2019-04-11 VITALS — BP 143/78 | HR 64 | Ht 73.0 in | Wt 209.0 lb

## 2019-04-11 DIAGNOSIS — R972 Elevated prostate specific antigen [PSA]: Secondary | ICD-10-CM | POA: Diagnosis not present

## 2019-04-12 ENCOUNTER — Encounter: Payer: Self-pay | Admitting: Urology

## 2019-04-12 ENCOUNTER — Telehealth: Payer: Self-pay | Admitting: *Deleted

## 2019-04-12 ENCOUNTER — Other Ambulatory Visit: Payer: Self-pay | Admitting: Urology

## 2019-04-12 DIAGNOSIS — R972 Elevated prostate specific antigen [PSA]: Secondary | ICD-10-CM

## 2019-04-12 LAB — PSA: Prostate Specific Ag, Serum: 12 ng/mL — ABNORMAL HIGH (ref 0.0–4.0)

## 2019-04-12 NOTE — Telephone Encounter (Addendum)
Patient informed, voiced understanding. Scheduled virtual visit.   ----- Message from Hollice Espy, MD sent at 04/12/2019  2:54 PM EDT ----- PSA is actually up to 12 on repeat.  This is concerning.  I think we should go ahead and pursue MRI.  I placed the order.  Please let him know.  Also, can you arrange a virtual visit follow-up after MRI?  Hollice Espy, MD

## 2019-04-25 ENCOUNTER — Ambulatory Visit: Payer: Medicare Other | Admitting: Gastroenterology

## 2019-04-26 ENCOUNTER — Other Ambulatory Visit: Payer: Self-pay

## 2019-04-26 ENCOUNTER — Ambulatory Visit (INDEPENDENT_AMBULATORY_CARE_PROVIDER_SITE_OTHER): Payer: Medicare Other | Admitting: Gastroenterology

## 2019-04-26 ENCOUNTER — Encounter: Payer: Self-pay | Admitting: Gastroenterology

## 2019-04-26 ENCOUNTER — Ambulatory Visit: Payer: Medicare Other | Admitting: Gastroenterology

## 2019-04-26 DIAGNOSIS — R1310 Dysphagia, unspecified: Secondary | ICD-10-CM

## 2019-04-26 NOTE — Progress Notes (Signed)
[name], MD [address]  [place]  [place], [place] [address]  Main: [phone]  Fax: [phone]   Primary Care Physician: Tonia Ghent, MD  Virtual Visit via Video Note  I connected with patient on 04/26/19 at 11:15 AM EDT by video (using [email or web address]) and verified that I am speaking with the correct person using two identifiers.   I discussed the limitations, risks, security and privacy concerns of performing an evaluation and management service by video and the availability of in person appointments. I also discussed with the patient that there may be a patient responsible charge related to this service. The patient expressed understanding and agreed to proceed.  Location of Patient: Home Location of Provider: Home Persons involved: Patient and provider only (Nursing staff checked in patient via phone but were not physically involved in the video interaction - see their notes)   History of Present Illness: Chief Complaint  Patient presents with  . globus sensation    Patient is having no symptoms     HPI: Victor Ball is a 79 y.o. male following up for dysphagia.  Symptoms are with pills only.  No dysphagia to solids or liquid foods.  On last visit he was advised to discontinue or cut down on putting vinegar in his food as he was doing that multiple times a day as part of a new diet for weight loss.  Symptoms were considered to be from globus sensation, and reflux from increased vinegar use.  He was also asked to start taking disintegrating Prevacid and states with that his globus sensation completely resolved.  However, yesterday morning after he swallowed his pills, he started having symptoms again which she describes as "feeling raw in his throat".  However, he continues to be able to swallow solids and liquids without difficulty since then.  No nausea or vomiting.  No altered bowel habits.  He has tried drinking lots of water, and warm tea but continues  to have sensation and difficulty swallowing pills that restarted yesterday  Previous records regarding his hepatitis C treatment obtained and is available in media.  Treated with Harvoni and obtain SVR.  Fibrosis score of 0 to F1 on elastography  Current Outpatient Medications  Medication Sig Dispense Refill  . Ascorbic Acid (VITAMIN C) 1000 MG tablet Take 1,000 mg by mouth daily.    Marland Kitchen aspirin EC 81 MG tablet Take 81 mg by mouth daily.    Marland Kitchen atorvastatin (LIPITOR) 10 MG tablet Take 1 tablet (10 mg total) by mouth daily. 90 tablet 3  . Cholecalciferol (VITAMIN D) 50 MCG (2000 UT) CAPS Take 4,000 Units by mouth.    . lansoprazole (PREVACID SOLUTAB) 15 MG disintegrating tablet Take 1 tablet (15 mg total) by mouth every morning. 30 tablet 0  . lisinopril (ZESTRIL) 5 MG tablet Take 1 tablet (5 mg total) by mouth daily. 90 tablet 3  . Misc Natural Products (TART CHERRY ADVANCED PO) Take 3,000 mg by mouth daily.    . Multiple Vitamins-Minerals (MATURE ADULT CENTURY PO) Take 1 tablet by mouth daily.     . Omega-3 Fatty Acids (EQL OMEGA 3 FISH OIL) 1400 MG CAPS Take 1,400 mg by mouth daily.    . Turmeric Curcumin 500 MG CAPS Take 500 mg by mouth daily.      No current facility-administered medications for this visit.    Allergies as of 04/26/2019  . (No Known Allergies)    Review of Systems:  All systems reviewed and negative except where noted in HPI.   Observations/Objective:  Labs: CMP     Component Value Date/Time   NA 143 02/26/2019 0820   K 4.8 02/26/2019 0820   CL 107 02/26/2019 0820   CO2 30 02/26/2019 0820   GLUCOSE 96 02/26/2019 0820   BUN 22 02/26/2019 0820   CREATININE 0.85 02/26/2019 0820   CALCIUM 9.2 02/26/2019 0820   PROT 6.4 02/26/2019 0820   PROT 6.8 10/07/2017 0722   ALBUMIN 4.1 02/26/2019 0820   ALBUMIN 4.3 10/07/2017 0722   AST 13 02/26/2019 0820   ALT 12 02/26/2019 0820   ALKPHOS 72 02/26/2019 0820   BILITOT 0.4 02/26/2019 0820   BILITOT 0.5 10/07/2017  0722   GFRNONAA >60 09/14/2017 0401   GFRAA >60 09/14/2017 0401   Lab Results  Component Value Date   WBC 9.8 09/14/2017   HGB 13.5 09/14/2017   HCT 40.0 09/14/2017   MCV 92.8 09/14/2017   PLT 156 09/14/2017    Imaging Studies: No results found.  Assessment and Plan:   Victor Ball is a 79 y.o. y/o male here for follow-up of dysphagia to pills only  Assessment and Plan: Patient symptoms had initially resolved after he cut down on the amount of vinegar he was using, and had started disintegrating Prevacid daily.    Is having dysphagia to pills again starting yesterday.  This could just be pill esophagitis.  However, since symptoms have returned despite him making diet changes and being on Prevacid daily, EGD indicated for further evaluation  Continue to not use vinegar  Continue disintegrating Prevacid    Follow Up Instructions:    I discussed the assessment and treatment plan with the patient. The patient was provided an opportunity to ask questions and all were answered. The patient agreed with the plan and demonstrated an understanding of the instructions.   The patient was advised to call back or seek an in-person evaluation if the symptoms worsen or if the condition fails to improve as anticipated.  I provided 15 minutes of face-to-face time via video software during this encounter. Additional time was spent in reviewing patient's chart, placing orders etc.   Virgel Manifold, MD  Speech recognition software was used to dictate this note.

## 2019-05-02 ENCOUNTER — Ambulatory Visit: Payer: Medicare Other | Admitting: Gastroenterology

## 2019-05-11 ENCOUNTER — Ambulatory Visit
Admission: RE | Admit: 2019-05-11 | Discharge: 2019-05-11 | Disposition: A | Discharge status: Home / Self Care | Payer: Medicare Other | Source: Ambulatory Visit | Attending: Urology | Admitting: Urology

## 2019-05-11 ENCOUNTER — Other Ambulatory Visit: Payer: Self-pay

## 2019-05-11 DIAGNOSIS — R972 Elevated prostate specific antigen [PSA]: Secondary | ICD-10-CM

## 2019-05-11 MED ORDER — GADOBUTROL 1 MMOL/ML IV SOLN
9.0000 mL | Freq: Once | INTRAVENOUS | Status: AC | PRN
  Administered 2019-05-11: 9 mL via INTRAVENOUS

## 2019-05-14 ENCOUNTER — Encounter: Payer: Self-pay | Admitting: Urology

## 2019-05-15 NOTE — Progress Notes (Signed)
Virtual Visit via Video Note  I connected with Victor Ball on 05/16/19 at  4:00 PM EDT by a video enabled telemedicine application and verified that I am speaking with the correct person using two identifiers.  Location: Patient: home Provider: office   I discussed the limitations of evaluation and management by telemedicine and the availability of in person appointments. The patient expressed understanding and agreed to proceed.   History of Present Illness: Victor Ball is a 80 y.o. M who returns today for the evaluation and management of elevated PSA.   PSA in 2010 was 6 w/ negative prostate bx. Prostate bx in 2003 was also negative. He reports of bringing in all these records at next visit.  He had 2 negative prostate biopsies in the past with his first prostate bx being 19-20 years ago and second one performed in [place] 10 years ago. He reports PSA of 5 during prostate biopsy however he is not completely sure.   He had 3 close friends with prostate cancer so he requested a PSA test. PSA 9.7 elevated from previous PSA of 6.29 in 2012.   Most recent PSA 12.0 as of 04/11/19.   MRI of prostate showed no sign of high risk lesion. BPH with prostatomegaly also noted.   Former smoker. He quit smoking 46 years ago.  Observations/Objective: Pt is engaged and asking good questions.   Pertinent Imagings:  CLINICAL DATA:  Increasing PSA. History of prior biopsies, most recent 10 years ago. Most recent reported PSA of 9.73.  EXAM: MR PROSTATE WITHOUT AND WITH CONTRAST  TECHNIQUE: Multiplanar multisequence MRI images were obtained of the pelvis centered about the prostate. Pre and post contrast images were obtained.  CONTRAST:  12mL GADAVIST GADOBUTROL 1 MMOL/ML IV SOLN  COMPARISON:  None  FINDINGS: Prostate: Marked BPH. No high-risk lesion in the transitional zone.  Thinning of the peripheral zone without high-risk lesion. Diffusion weighted imaging mildly compromised  secondary to susceptibility artifact from hip replacements. Much of the peripheral zone and the gland in general is well evaluated even despite susceptibility. The LEFT gland and most extreme peripheral RIGHT gland shows limited assessment.  Volume: 7.9 x 6.1 x 6.8 (volume = 170 cc) cm  Transcapsular spread:  Absent  Seminal vesicle involvement: Absent  Neurovascular bundle involvement: Absent  Pelvic adenopathy: Absent  Bone metastasis: Absent  Other findings: None  IMPRESSION: No sign of high-risk lesion.  Study mildly compromised by bilateral hip replacements.  Marked BPH with prostatomegaly.   Electronically Signed   By: Zetta Bills M.D.   On: 05/11/2019 12:20  I have personally reviewed the images and agree with radiologist interpretation.   Assessment and Plan:  1. Elevated PSA  Most recent PSA elevated to 12 as of 04/11/19 from 9.7  MRI of prostate benign, noted BPH w/ prostatomegaly  Repeat PSA in 4 months w/ virtual visit If PSA continues to elevate, may be concerning    2. BPH w/o LUTS As above   I discussed the assessment and treatment plan with the patient. The patient was provided an opportunity to ask questions and all were answered. The patient agreed with the plan and demonstrated an understanding of the instructions.   The patient was advised to call back or seek an in-person evaluation if the symptoms worsen or if the condition fails to improve as anticipated.  I provided 14 minutes of non-face-to-face time during this encounter.  Jamas Lav, am acting as a scribe for Dr. Caryl Pina  Erlene Quan,  I have reviewed the above documentation for accuracy and completeness, and I agree with the above.   Hollice Espy, MD

## 2019-05-16 ENCOUNTER — Other Ambulatory Visit: Payer: Self-pay

## 2019-05-16 ENCOUNTER — Telehealth (INDEPENDENT_AMBULATORY_CARE_PROVIDER_SITE_OTHER): Payer: Medicare Other | Admitting: Urology

## 2019-05-16 DIAGNOSIS — R972 Elevated prostate specific antigen [PSA]: Secondary | ICD-10-CM

## 2019-05-31 ENCOUNTER — Other Ambulatory Visit: Payer: Self-pay

## 2019-05-31 ENCOUNTER — Other Ambulatory Visit
Admission: RE | Admit: 2019-05-31 | Discharge: 2019-05-31 | Disposition: A | Discharge status: Home / Self Care | Payer: Medicare Other | Source: Ambulatory Visit | Attending: Gastroenterology | Admitting: Gastroenterology

## 2019-05-31 DIAGNOSIS — Z20822 Contact with and (suspected) exposure to covid-19: Secondary | ICD-10-CM | POA: Insufficient documentation

## 2019-05-31 DIAGNOSIS — Z01812 Encounter for preprocedural laboratory examination: Secondary | ICD-10-CM | POA: Diagnosis not present

## 2019-05-31 LAB — SARS CORONAVIRUS 2 (TAT 6-24 HRS): SARS Coronavirus 2: NEGATIVE

## 2019-06-04 ENCOUNTER — Encounter: Payer: Self-pay | Admitting: Gastroenterology

## 2019-06-04 ENCOUNTER — Ambulatory Visit: Payer: Medicare Other | Admitting: Certified Registered Nurse Anesthetist

## 2019-06-04 ENCOUNTER — Encounter
Admission: RE | Disposition: A | Discharge status: Home / Self Care | Payer: Self-pay | Source: Home / Self Care | Attending: Gastroenterology

## 2019-06-04 ENCOUNTER — Other Ambulatory Visit: Payer: Self-pay

## 2019-06-04 ENCOUNTER — Ambulatory Visit
Admission: RE | Admit: 2019-06-04 | Discharge: 2019-06-04 | Disposition: A | Discharge status: Home / Self Care | Payer: Medicare Other | Attending: Gastroenterology | Admitting: Gastroenterology

## 2019-06-04 DIAGNOSIS — I10 Essential (primary) hypertension: Secondary | ICD-10-CM | POA: Diagnosis not present

## 2019-06-04 DIAGNOSIS — Z87891 Personal history of nicotine dependence: Secondary | ICD-10-CM | POA: Diagnosis not present

## 2019-06-04 DIAGNOSIS — R1310 Dysphagia, unspecified: Secondary | ICD-10-CM | POA: Diagnosis not present

## 2019-06-04 DIAGNOSIS — K3189 Other diseases of stomach and duodenum: Secondary | ICD-10-CM

## 2019-06-04 DIAGNOSIS — R12 Heartburn: Secondary | ICD-10-CM | POA: Insufficient documentation

## 2019-06-04 DIAGNOSIS — Z7982 Long term (current) use of aspirin: Secondary | ICD-10-CM | POA: Diagnosis not present

## 2019-06-04 DIAGNOSIS — E785 Hyperlipidemia, unspecified: Secondary | ICD-10-CM | POA: Diagnosis not present

## 2019-06-04 DIAGNOSIS — K319 Disease of stomach and duodenum, unspecified: Secondary | ICD-10-CM | POA: Insufficient documentation

## 2019-06-04 DIAGNOSIS — K222 Esophageal obstruction: Secondary | ICD-10-CM | POA: Insufficient documentation

## 2019-06-04 DIAGNOSIS — Z87442 Personal history of urinary calculi: Secondary | ICD-10-CM | POA: Diagnosis not present

## 2019-06-04 DIAGNOSIS — K219 Gastro-esophageal reflux disease without esophagitis: Secondary | ICD-10-CM | POA: Diagnosis not present

## 2019-06-04 DIAGNOSIS — K317 Polyp of stomach and duodenum: Secondary | ICD-10-CM | POA: Diagnosis not present

## 2019-06-04 DIAGNOSIS — D132 Benign neoplasm of duodenum: Secondary | ICD-10-CM | POA: Insufficient documentation

## 2019-06-04 DIAGNOSIS — Z79899 Other long term (current) drug therapy: Secondary | ICD-10-CM | POA: Diagnosis not present

## 2019-06-04 DIAGNOSIS — B182 Chronic viral hepatitis C: Secondary | ICD-10-CM | POA: Insufficient documentation

## 2019-06-04 DIAGNOSIS — Z8249 Family history of ischemic heart disease and other diseases of the circulatory system: Secondary | ICD-10-CM | POA: Diagnosis not present

## 2019-06-04 DIAGNOSIS — K2270 Barrett's esophagus without dysplasia: Secondary | ICD-10-CM | POA: Diagnosis not present

## 2019-06-04 DIAGNOSIS — Z96643 Presence of artificial hip joint, bilateral: Secondary | ICD-10-CM | POA: Diagnosis not present

## 2019-06-04 DIAGNOSIS — M1990 Unspecified osteoarthritis, unspecified site: Secondary | ICD-10-CM | POA: Diagnosis not present

## 2019-06-04 HISTORY — PX: ESOPHAGOGASTRODUODENOSCOPY (EGD) WITH PROPOFOL: SHX5813

## 2019-06-04 SURGERY — ESOPHAGOGASTRODUODENOSCOPY (EGD) WITH PROPOFOL
Anesthesia: General

## 2019-06-04 MED ORDER — GLYCOPYRROLATE 0.2 MG/ML IJ SOLN
INTRAMUSCULAR | Status: AC
  Filled 2019-06-04: qty 1

## 2019-06-04 MED ORDER — ONDANSETRON HCL 4 MG/2ML IJ SOLN
INTRAMUSCULAR | Status: AC
  Filled 2019-06-04: qty 2

## 2019-06-04 MED ORDER — PROPOFOL 500 MG/50ML IV EMUL
INTRAVENOUS | Status: DC | PRN
  Administered 2019-06-04: 120 ug/kg/min via INTRAVENOUS
  Administered 2019-06-04: 150 ug/kg/min via INTRAVENOUS

## 2019-06-04 MED ORDER — ONDANSETRON HCL 4 MG/2ML IJ SOLN
INTRAMUSCULAR | Status: DC | PRN
  Administered 2019-06-04: 4 mg via INTRAVENOUS

## 2019-06-04 MED ORDER — PROPOFOL 500 MG/50ML IV EMUL
INTRAVENOUS | Status: AC
  Filled 2019-06-04: qty 50

## 2019-06-04 MED ORDER — LIDOCAINE HCL (PF) 2 % IJ SOLN
INTRAMUSCULAR | Status: AC
  Filled 2019-06-04: qty 10

## 2019-06-04 MED ORDER — GLYCOPYRROLATE 0.2 MG/ML IJ SOLN
INTRAMUSCULAR | Status: DC | PRN
  Administered 2019-06-04: .2 mg via INTRAVENOUS

## 2019-06-04 MED ORDER — SODIUM CHLORIDE 0.9 % IV SOLN: INTRAVENOUS | Status: DC

## 2019-06-04 MED ORDER — LIDOCAINE HCL (CARDIAC) PF 100 MG/5ML IV SOSY
PREFILLED_SYRINGE | INTRAVENOUS | Status: DC | PRN
  Administered 2019-06-04: 60 mg via INTRAVENOUS

## 2019-06-04 MED ORDER — PROPOFOL 10 MG/ML IV BOLUS
INTRAVENOUS | Status: DC | PRN
  Administered 2019-06-04: 100 mg via INTRAVENOUS

## 2019-06-04 NOTE — Anesthesia Preprocedure Evaluation (Signed)
Anesthesia Evaluation  Patient identified by MRN, date of birth, ID band Patient awake    Reviewed: Allergy & Precautions, NPO status , Patient's Chart, lab work & pertinent test results  History of Anesthesia Complications Negative for: history of anesthetic complications  Airway Mallampati: I  TM Distance: >3 FB Neck ROM: Full    Dental  (+) Dental Advisory Given   Pulmonary neg pulmonary ROS, former smoker,    breath sounds clear to auscultation       Cardiovascular hypertension, Pt. on medications (-) angina(-) Past MI and (-) CHF  Rhythm:Regular     Neuro/Psych negative neurological ROS  negative psych ROS   GI/Hepatic negative GI ROS, (+) Hepatitis -, CTreated with Harvani  Dysphagia with pills   Endo/Other    Renal/GU Renal disease     Musculoskeletal  (+) Arthritis ,   Abdominal   Peds  Hematology No blood thinners per patient   Anesthesia Other Findings   Reproductive/Obstetrics                             Anesthesia Physical  Anesthesia Plan  ASA: II  Anesthesia Plan: General   Post-op Pain Management:    Induction: Intravenous  PONV Risk Score and Plan: 2 and Ondansetron, Propofol infusion and TIVA  Airway Management Planned: Nasal Cannula  Additional Equipment: None  Intra-op Plan:   Post-operative Plan:   Informed Consent: I have reviewed the patients History and Physical, chart, labs and discussed the procedure including the risks, benefits and alternatives for the proposed anesthesia with the patient or authorized representative who has indicated his/her understanding and acceptance.     Dental advisory given  Plan Discussed with: CRNA and Surgeon  Anesthesia Plan Comments: (Discussed risks of anesthesia with patient, including possibility of difficulty with spontaneous ventilation under anesthesia necessitating airway intervention, PONV, and rare risks  such as cardiac or respiratory or neurological events. Patient understands.)        Anesthesia Quick Evaluation

## 2019-06-04 NOTE — Anesthesia Postprocedure Evaluation (Signed)
Anesthesia Post Note  Patient: Victor Ball  Procedure(s) Performed: ESOPHAGOGASTRODUODENOSCOPY (EGD) WITH PROPOFOL (N/A )  Patient location during evaluation: Endoscopy Anesthesia Type: General Level of consciousness: awake and alert Pain management: pain level controlled Vital Signs Assessment: post-procedure vital signs reviewed and stable Respiratory status: spontaneous breathing, nonlabored ventilation, respiratory function stable and patient connected to nasal cannula oxygen Cardiovascular status: blood pressure returned to baseline and stable Postop Assessment: no apparent nausea or vomiting Anesthetic complications: no     Last Vitals:  Vitals:   06/04/19 0956 06/04/19 1006  BP: (!) 142/79 (!) 156/89  Pulse: 65 (!) 55  Resp: 14 15  Temp:    SpO2: 99% 100%    Last Pain:  Vitals:   06/04/19 1006  TempSrc:   PainSc: 0-No pain                 Arita Miss

## 2019-06-04 NOTE — Transfer of Care (Signed)
Immediate Anesthesia Transfer of Care Note  Patient: Victor Ball  Procedure(s) Performed: ESOPHAGOGASTRODUODENOSCOPY (EGD) WITH PROPOFOL (N/A )  Patient Location: PACU and Endoscopy Unit  Anesthesia Type:General  Level of Consciousness: awake and alert   Airway & Oxygen Therapy: Patient Spontanous Breathing and Patient connected to nasal cannula oxygen  Post-op Assessment: Report given to RN and Post -op Vital signs reviewed and stable  Post vital signs: Reviewed and stable  Last Vitals:  Vitals Value Taken Time  BP 123/70 06/04/19 0937  Temp 36.2 C 06/04/19 0936  Pulse 35 06/04/19 0940  Resp 9 06/04/19 0937  SpO2 99 % 06/04/19 0940  Vitals shown include unvalidated device data.  Last Pain:  Vitals:   06/04/19 0936  TempSrc: Temporal  PainSc: 0-No pain         Complications: No apparent anesthesia complications

## 2019-06-04 NOTE — Anesthesia Procedure Notes (Signed)
Performed by: Babs Sciara, CRNA Oxygen Delivery Method: Nasal cannula

## 2019-06-04 NOTE — Op Note (Signed)
[facility] Patient Name: Victor Ball Procedure Date: 06/04/2019 8:48 AM MRN: [id number] Account #: [id number] Date of Birth: January 17, 1941 Admit Type: Outpatient Age: 79 Room: [facility] END[facility] ROOM 3 Gender: Male Note Status: Finalized Procedure:             Upper GI endoscopy Indications:           Dysphagia, Heartburn Providers:             Artha Chiasson B. Bonna Gains MD, MD Medicines:             Monitored Anesthesia Care Complications:         No immediate complications. Procedure:             Pre-Anesthesia Assessment:                        - The risks and benefits of the procedure and the                         sedation options and risks were discussed with the                         patient. All questions were answered and informed                         consent was obtained.                        - Patient identification and proposed procedure were                         verified prior to the procedure.                        - ASA Grade Assessment: II - A patient with mild                         systemic disease.                        After obtaining informed consent, the endoscope was                         passed under direct vision. Throughout the procedure,                         the patient's blood pressure, pulse, and oxygen                         saturations were monitored continuously. The Endoscope                         was introduced through the mouth, and advanced to the                         third part of duodenum. The upper GI endoscopy was                         accomplished with ease. The patient tolerated the  procedure well. Findings:      A small area of extrinsic compression was found in the proximal       esophagus. This could represent dysphagia lusoria. It was mobile and       present at 20 cm from the incisors.      Islands of salmon-colored mucosa were present. No other visible        abnormalities were present. The maximum longitudinal extent of these       esophageal mucosal changes was 1 cm in length. Biopsies were taken with       a cold forceps for histology. Only one small island was present, 1 cm       above the Z- line. No tongues or any other abnormalities seen. Biopsies       were obtained from the proximal and distal esophagus with cold forceps       for histology of suspected eosinophilic esophagitis.      The exam of the esophagus was otherwise normal.      Mucosal changes characterized by thickened folds were found in the       gastric antrum. Biopsies were taken with a cold forceps for histology.      A few 3 to 4 mm sessile polyps with no bleeding and no stigmata of       recent bleeding were found in the gastric body. Biopsies were taken with       a cold forceps for histology.      A single 5 mm sessile polyp with no bleeding and no stigmata of recent       bleeding was found in the gastric body. Biopsies were taken with a cold       forceps for histology.      A single 10 mm pedunculated and sessile polyp with no bleeding was found       in the second portion of the duodenum. Biopsies were taken with a cold       forceps for histology.      Localized scalloped mucosa was found in the third portion of the       duodenum. Biopsies were taken with a cold forceps for histology.      The duodenal bulb was normal. Impression:            - Extrinsic compression in the proximal esophagus.                        - Patient's dysphagia symptoms may be due to dysphagia                         lusoria                        - Salmon-colored mucosa suspicious for short-segment                         Barrett's esophagus. Biopsied.                        - Thickened folds mucosa in the antrum. Biopsied.                        - A few gastric polyps. Biopsied.                        -  A single gastric polyp. Biopsied.                        - A single duodenal  polyp. Biopsied.                        - Scalloped mucosa was found in the duodenum. Biopsied.                        - Normal duodenal bulb. Recommendation:        - Await pathology results.                        - After pathology results are reviewed patient may                         need referral for duodenal polyp removal and EUS for                         proximal esophagus. To be discussed and set up at next                         clinic visit                        - Continue present medications.                        - Return to my office in 2 weeks.                        - Return to primary care physician as previously                         scheduled.                        - Follow an antireflux regimen.                        - The findings and recommendations were discussed with                         the patient. Procedure Code(s):     --- Professional ---                        [id number], Esophagogastroduodenoscopy, flexible,                         transoral; with biopsy, single or multiple Diagnosis Code(s):     --- Professional ---                        K22.2, Esophageal obstruction                        K22.8, Other specified diseases of esophagus                        K31.89, Other diseases of stomach and duodenum  K31.7, Polyp of stomach and duodenum                        R13.10, Dysphagia, unspecified                        R12, Heartburn CPT copyright 2019 American Medical Association. All rights reserved. The codes documented in this report are preliminary and upon coder review may  be revised to meet current compliance requirements.  [name], MD Margretta Sidle B. Bonna Gains MD, MD 06/04/2019 9:47:50 AM This report has been signed electronically. Number of Addenda: 0 Note Initiated On: 06/04/2019 8:48 AM Estimated Blood Loss:  Estimated blood loss: none.      [facility]

## 2019-06-04 NOTE — H&P (Signed)
Victor Antigua, MD [address] [place], [place], [place], [address] [id number], [place], [place], [place], [address] Phone: [phone]  Fax: [phone]  Primary Care Physician:  Victor Ghent, MD   Pre-Procedure History & Physical: HPI:  Victor Ball is a 79 y.o. male is here for an EGD.   Past Medical History:  Diagnosis Date  . Hepatitis C, chronic ([place])    treated prev with Harvoni, tested and cleared  . History of kidney stones   . Hyperlipidemia   . Hypertension     Past Surgical History:  Procedure Laterality Date  . APPENDECTOMY  1948  . JOINT REPLACEMENT     bilateral hip replacement  . LIVER BIOPSY    . PROSTATE BIOPSY     neg, x2  . TOE FUSION  1985   left great toe  . TONSILLECTOMY AND ADENOIDECTOMY  1945  . TOTAL HIP ARTHROPLASTY Left 11/09/2016   Procedure: LEFT TOTAL HIP ARTHROPLASTY ANTERIOR APPROACH;  Surgeon: Victor Rossetti, MD;  Location: [place];  Service: Orthopedics;  Laterality: Left;  . TOTAL HIP ARTHROPLASTY Right 09/13/2017   Procedure: RIGHT TOTAL HIP ARTHROPLASTY ANTERIOR APPROACH;  Surgeon: Victor Rossetti, MD;  Location: [place];  Service: Orthopedics;  Laterality: Right;  Marland Kitchen VASECTOMY      Prior to Admission medications   Medication Sig Start Date End Date Taking? Authorizing Provider  Ascorbic Acid (VITAMIN C) 1000 MG tablet Take 1,000 mg by mouth daily.   Yes [provider]  atorvastatin (LIPITOR) 10 MG tablet Take 1 tablet (10 mg total) by mouth daily. 03/05/19  Yes Victor Ghent, MD  lisinopril (ZESTRIL) 5 MG tablet Take 1 tablet (5 mg total) by mouth daily. 03/05/19  Yes Victor Ghent, MD  aspirin EC 81 MG tablet Take 81 mg by mouth daily.    [provider]  Cholecalciferol (VITAMIN D) 50 MCG (2000 UT) CAPS Take 4,000 Units by mouth.    [provider]  lansoprazole (PREVACID SOLUTAB) 15 MG disintegrating tablet Take 1 tablet (15 mg total) by mouth every morning. 03/15/19  04/26/19  Victor Manifold, MD  Misc Natural Products (TART CHERRY ADVANCED PO) Take 3,000 mg by mouth daily.    [provider]  Multiple Vitamins-Minerals (MATURE ADULT CENTURY PO) Take 1 tablet by mouth daily.     [provider]  Omega-3 Fatty Acids (EQL OMEGA 3 FISH OIL) 1400 MG CAPS Take 1,400 mg by mouth daily.    [provider]  Turmeric Curcumin 500 MG CAPS Take 500 mg by mouth daily.     [provider]    Allergies as of 04/26/2019  . (No Known Allergies)    Family History  Problem Relation Age of Onset  . Heart disease Father   . Diabetes Father   . Heart attack Father   . Celiac disease Sister   . Heart disease Brother        MI then stents  . Heart attack Brother   . Stroke Brother   . Cancer Paternal Aunt   . Prostate cancer Neg Hx   . Colon cancer Neg Hx     Social History   Socioeconomic History  . Marital status: Married    Spouse name: Not on file  . Number of children: 2  . Years of education: Not on file  . Highest education level: Not on file  Occupational History  . Occupation: Insurance agent---retired    Comment:  Hospital malpractice and worker's comp  Tobacco Use  . Smoking status: Former Smoker    Quit date: 01/18/1973    Years since quitting: 46.4  . Smokeless tobacco: Never Used  Substance and Sexual Activity  . Alcohol use: Yes    Alcohol/week: 7.0 standard drinks    Types: 4 Glasses of wine, 3 Standard drinks or equivalent per week    Comment: 1 drink a day - either wine or cocktail  . Drug use: No  . Sexual activity: Yes  Other Topics Concern  . Not on file  Social History Narrative   2 daughters   Married 1961   Partially retired from Insurance underwriter, still consulting some as of 2020      Has living will   Wife, then daughters equally if wife were incapacitated, has health care POA.   Would accept resuscitation attempts   Not sure about tube feeds   Social Determinants of Health   Financial  Resource Strain: Low Risk   . Difficulty of Paying Living Expenses: Not hard at all  Food Insecurity: No Food Insecurity  . Worried About Charity fundraiser in the Last Year: Never true  . Ran Out of Food in the Last Year: Never true  Transportation Needs: No Transportation Needs  . Lack of Transportation (Medical): No  . Lack of Transportation (Non-Medical): No  Physical Activity: Sufficiently Active  . Days of Exercise per Week: 3 days  . Minutes of Exercise per Session: 60 min  Stress: No Stress Concern Present  . Feeling of Stress : Not at all  Social Connections:   . Frequency of Communication with Friends and Family:   . Frequency of Social Gatherings with Friends and Family:   . Attends Religious Services:   . Active Member of Clubs or Organizations:   . Attends Archivist Meetings:   Marland Kitchen Marital Status:   Intimate Partner Violence: Not At Risk  . Fear of Current or Ex-Partner: No  . Emotionally Abused: No  . Physically Abused: No  . Sexually Abused: No    Review of Systems: See HPI, otherwise negative ROS  Physical Exam: BP (!) 176/95   Pulse 63   Temp (!) 96.5 F (35.8 C) (Temporal)   Resp 16   Ht 6\' 3"  (1.905 m)   Wt 89.8 kg   SpO2 99%   BMI 24.75 kg/m  General:   Alert,  pleasant and cooperative in NAD Head:  Normocephalic and atraumatic. Neck:  Supple; no masses or thyromegaly. Lungs:  Clear throughout to auscultation, normal respiratory effort.    Heart:  +S1, +S2, Regular rate and rhythm, No edema. Abdomen:  Soft, nontender and nondistended. Normal bowel sounds, without guarding, and without rebound.   Neurologic:  Alert and  oriented x4;  grossly normal neurologically.  Impression/Plan: Victor Ball is here for an EGD for Acid Reflux.  Risks, benefits, limitations, and alternatives regarding the procedure have been reviewed with the patient.  Questions have been answered.  All parties agreeable.   Victor Manifold, MD  06/04/2019, 9:04  AM

## 2019-06-05 LAB — SURGICAL PATHOLOGY

## 2019-06-06 ENCOUNTER — Encounter: Payer: Self-pay | Admitting: *Deleted

## 2019-06-12 ENCOUNTER — Telehealth: Payer: Self-pay

## 2019-06-12 NOTE — Telephone Encounter (Signed)
Patient called stating that he was returning Dr. Michele Mcalpine call. I saw that she wanted to see him back in 2 weeks after his EGD. Patient had an appointment set up already for 06/25/19. Therefore, I told patient that if I did not hear anything from Dr. Bonna Gains prior to his appointment, then we will see him at the [facility] location on 06/25/2019. Patient agreed and had no further questions.

## 2019-06-13 ENCOUNTER — Telehealth: Payer: Self-pay

## 2019-06-13 ENCOUNTER — Other Ambulatory Visit: Payer: Self-pay | Admitting: Gastroenterology

## 2019-06-13 DIAGNOSIS — R1319 Other dysphagia: Secondary | ICD-10-CM

## 2019-06-13 NOTE — Telephone Encounter (Signed)
-----   Message from Virgel Manifold, MD sent at 06/13/2019 11:12 AM EDT ----- Herb Grays I talked to the patient about his results.  I have ordered a CT scan for dysphagia, please schedule.  I also discussed his case with Dr. Rush Landmark and he is willing to see them.  Please give him the number to Dr. Donneta Romberg office and if he does not hear back from them within a week, please ask him to call us.  Please reschedule his appointment from June 7th to 3 months.

## 2019-06-13 NOTE — Telephone Encounter (Signed)
Called patient to let him know that his CT Scan was scheduled to be on 06/22/2019 at 9:15 AM at the [facility] and he was also told not to eat or drink 4 hours prior to his CT. The referral to Dr. Ihor Austin was sent and patient was provided with his number [phone]). His appointment with Dr. Bonna Gains for June 4, 21 will be rescheduled. Patient understood and had no further questions.

## 2019-06-20 ENCOUNTER — Other Ambulatory Visit: Payer: Self-pay

## 2019-06-20 ENCOUNTER — Ambulatory Visit
Admission: RE | Admit: 2019-06-20 | Discharge: 2019-06-20 | Disposition: A | Discharge status: Home / Self Care | Payer: Medicare Other | Source: Ambulatory Visit | Attending: Gastroenterology | Admitting: Gastroenterology

## 2019-06-20 DIAGNOSIS — R1310 Dysphagia, unspecified: Secondary | ICD-10-CM | POA: Insufficient documentation

## 2019-06-20 DIAGNOSIS — R1319 Other dysphagia: Secondary | ICD-10-CM

## 2019-06-20 DIAGNOSIS — I6523 Occlusion and stenosis of bilateral carotid arteries: Secondary | ICD-10-CM | POA: Diagnosis not present

## 2019-06-20 LAB — POCT I-STAT CREATININE: Creatinine, Ser: 0.9 mg/dL (ref 0.61–1.24)

## 2019-06-20 MED ORDER — IOHEXOL 350 MG/ML SOLN
75.0000 mL | Freq: Once | INTRAVENOUS | Status: AC | PRN
  Administered 2019-06-20: 75 mL via INTRAVENOUS

## 2019-06-21 ENCOUNTER — Telehealth: Payer: Self-pay

## 2019-06-21 NOTE — Telephone Encounter (Signed)
Mansouraty, Telford Nab., MD  Timothy Lasso, RN  Yvonnia Tango, I have had a separate inbox thread with Dr. Bonna Gains. Please move forward with scheduling a clinic visit (okay if telemedicine if necessary) to discuss EUS and possible EMR of the duodenal lesion. The esophageal dysphagia is being further worked up with a CT of the neck. I may do an EUS look of the esophagus to rule out esophageal dysphagia Lusoria depending on what the CT scan shows. Patient needs an EUS/EGD EMR 90-minute slot as well if he is okay with scheduling now otherwise if he wants to wait till the clinic visit. Thanks. GM

## 2019-06-21 NOTE — Telephone Encounter (Signed)
Your first available appt is in August is that ok?

## 2019-06-22 NOTE — Telephone Encounter (Signed)
Office visit made for 6/25 at 930 am with Dr Rush Landmark to disuss EUS/EMR.  Pt aware

## 2019-06-22 NOTE — Telephone Encounter (Signed)
Patty, that is OK for the timing of the procedure. Hopefully an earlier clinic visit or double book at 350. Thank you. GM

## 2019-06-25 ENCOUNTER — Ambulatory Visit: Payer: Medicare Other | Admitting: Gastroenterology

## 2019-07-13 ENCOUNTER — Ambulatory Visit: Payer: Medicare Other | Admitting: Gastroenterology

## 2019-07-13 ENCOUNTER — Other Ambulatory Visit (INDEPENDENT_AMBULATORY_CARE_PROVIDER_SITE_OTHER): Payer: Medicare Other

## 2019-07-13 ENCOUNTER — Encounter: Payer: Self-pay | Admitting: Gastroenterology

## 2019-07-13 VITALS — BP 132/80 | HR 63 | Ht 74.0 in | Wt 200.4 lb

## 2019-07-13 DIAGNOSIS — D132 Benign neoplasm of duodenum: Secondary | ICD-10-CM | POA: Diagnosis not present

## 2019-07-13 DIAGNOSIS — R933 Abnormal findings on diagnostic imaging of other parts of digestive tract: Secondary | ICD-10-CM | POA: Diagnosis not present

## 2019-07-13 DIAGNOSIS — R1310 Dysphagia, unspecified: Secondary | ICD-10-CM

## 2019-07-13 DIAGNOSIS — K3189 Other diseases of stomach and duodenum: Secondary | ICD-10-CM

## 2019-07-13 DIAGNOSIS — R1319 Other dysphagia: Secondary | ICD-10-CM

## 2019-07-13 LAB — BASIC METABOLIC PANEL
BUN: 19 mg/dL (ref 6–23)
CO2: 28 mEq/L (ref 19–32)
Calcium: 9.5 mg/dL (ref 8.4–10.5)
Chloride: 103 mEq/L (ref 96–112)
Creatinine, Ser: 0.79 mg/dL (ref 0.40–1.50)
GFR: 94.03 mL/min (ref >60.00)
Glucose, Bld: 94 mg/dL (ref 70–99)
Potassium: 5 mEq/L (ref 3.5–5.1)
Sodium: 139 mEq/L (ref 135–145)

## 2019-07-13 LAB — CBC
HCT: 46.9 % (ref 39.0–52.0)
Hemoglobin: 16 g/dL (ref 13.0–17.0)
MCHC: 34.1 g/dL (ref 30.0–36.0)
MCV: 93.7 fl (ref 78.0–100.0)
Platelets: 145 10*3/uL — ABNORMAL LOW (ref 150.0–400.0)
RBC: 5 Mil/uL (ref 4.22–5.81)
RDW: 13.9 % (ref 11.5–15.5)
WBC: 5.7 10*3/uL (ref 4.0–10.5)

## 2019-07-13 LAB — PROTIME-INR
INR: 1 ratio (ref 0.8–1.0)
Prothrombin Time: 11.4 s (ref 9.6–13.1)

## 2019-07-13 NOTE — Patient Instructions (Signed)
You have been scheduled for an endoscopy. Please follow written instructions given to you at your visit today. If you use inhalers (even only as needed), please bring them with you on the day of your procedure.  Your provider has requested that you go to the basement level for lab work before leaving today. Press "B" on the elevator. The lab is located at the first door on the left as you exit the elevator.  Due to recent changes in healthcare laws, you may see the results of your imaging and laboratory studies on MyChart before your provider has had a chance to review them.  We understand that in some cases there may be results that are confusing or concerning to you. Not all laboratory results come back in the same time frame and the provider may be waiting for multiple results in order to interpret others.  Please give [place] 48 hours in order for your provider to thoroughly review all the results before contacting the office for clarification of your results.   If you are age 25 or older, your body mass index should be between 23-30. Your Body mass index is 25.73 kg/m. If this is out of the aforementioned range listed, please consider follow up with your Primary Care Provider.  If you are age 6 or younger, your body mass index should be between 19-25. Your Body mass index is 25.73 kg/m. If this is out of the aformentioned range listed, please consider follow up with your Primary Care Provider.    Thank you for choosing me and [facility].  Dr. Rush Landmark

## 2019-07-15 ENCOUNTER — Encounter: Payer: Self-pay | Admitting: Gastroenterology

## 2019-07-15 DIAGNOSIS — K3189 Other diseases of stomach and duodenum: Secondary | ICD-10-CM | POA: Insufficient documentation

## 2019-07-15 DIAGNOSIS — R933 Abnormal findings on diagnostic imaging of other parts of digestive tract: Secondary | ICD-10-CM | POA: Insufficient documentation

## 2019-07-15 DIAGNOSIS — D132 Benign neoplasm of duodenum: Secondary | ICD-10-CM | POA: Insufficient documentation

## 2019-07-15 NOTE — Progress Notes (Signed)
GASTROENTEROLOGY OUTPATIENT CLINIC VISIT   Primary Care Provider Tonia Ghent, MD [place]  [id number] [phone]  Referring Provider Dr. Bonna Gains   Patient Profile: Victor Ball is a 79 y.o. male with a pmh significant for hypertension, hyperlipidemia, nephrolithiasis, osteoarthritis, prior HCV infection status post treatment, gastritis/duodenitis, duodenal adenoma.  The patient presents to the [facility] for an evaluation and management of problem(s) noted below:  Problem List 1. Duodenal adenoma   2. Duodenal nodule   3. Abnormal endoscopy of upper gastrointestinal tract   4. Esophageal dysphagia     History of Present Illness This is the patient's first visit to the outpatient [place] clinic.  The patient follows with Dr. Bonna Gains for issues of globus sensation/dysphagia.  He underwent an endoscopy in May.  He was found to have a potential subepithelial lesion in the esophagus concerning for dysphagia lusoria.  He was also found to have gastritis.  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  He also was found to have a duodenal adenoma.  It is for these reasons that the patient is referred for consideration of advanced resection of the duodenal adenoma and potentially EUS evaluation of the duodenal nodule and esophageal subepithelial lesion.  CT neck was performed suggesting patient may have osteophytes in the region of the finding of the subepithelial lesion.  His dysphagia is really 2 pills and not really to foods.  Patient is not losing weight at this time other than what he lost weight for in the setting of a diet.  He has no nausea or vomiting.  He does not regurgitate.  GI Review of Systems Positive as above  Negative for pyrosis, odynophagia, nausea, vomiting, pain, change in bowel habits, melena, hematochezia  Review of Systems General: Denies fevers/chills/weight loss  unintentionally HEENT: Denies oral lesions/sore throat Cardiovascular: Denies chest pain Pulmonary: Denies shortness of breath Gastroenterological: See HPI Genitourinary: Denies darkened urine Hematological: Denies easy bruising/bleeding Dermatological: Denies jaundice Psychological: Mood is stable   Medications Current Outpatient Medications  Medication Sig Dispense Refill  . Ascorbic Acid (VITAMIN C) 1000 MG tablet Take 1,000 mg by mouth daily.    Marland Kitchen aspirin EC 81 MG tablet Take 81 mg by mouth daily.    Marland Kitchen atorvastatin (LIPITOR) 10 MG tablet Take 1 tablet (10 mg total) by mouth daily. 90 tablet 3  . Cholecalciferol (VITAMIN D) 50 MCG (2000 UT) CAPS Take 4,000 Units by mouth.    Marland Kitchen lisinopril (ZESTRIL) 5 MG tablet Take 1 tablet (5 mg total) by mouth daily. 90 tablet 3  . Misc Natural Products (TART CHERRY ADVANCED PO) Take 3,000 mg by mouth daily.    . Multiple Vitamins-Minerals (MATURE ADULT CENTURY PO) Take 1 tablet by mouth daily.     . Omega-3 Fatty Acids (EQL OMEGA 3 FISH OIL) 1400 MG CAPS Take 1,400 mg by mouth daily.    . Turmeric Curcumin 500 MG CAPS Take 500 mg by mouth daily.      No current facility-administered medications for this visit.    Allergies No Known Allergies  Histories Past Medical History:  Diagnosis Date  . Hepatitis C, chronic ([place])    treated prev with Harvoni, tested and cleared  . History of kidney stones   . Hyperlipidemia   . Hypertension    Past Surgical History:  Procedure Laterality Date  . APPENDECTOMY  1948  . ESOPHAGOGASTRODUODENOSCOPY (EGD) WITH PROPOFOL N/A 06/04/2019  Procedure: ESOPHAGOGASTRODUODENOSCOPY (EGD) WITH PROPOFOL;  Surgeon: Virgel Manifold, MD;  Location: [facility];  Service: Endoscopy;  Laterality: N/A;  . JOINT REPLACEMENT     bilateral hip replacement  . LIVER BIOPSY    . PROSTATE BIOPSY     neg, x2  . TOE FUSION  1985   left great toe  . TONSILLECTOMY AND ADENOIDECTOMY  1945  . TOTAL HIP ARTHROPLASTY  Left 11/09/2016   Procedure: LEFT TOTAL HIP ARTHROPLASTY ANTERIOR APPROACH;  Surgeon: Mcarthur Rossetti, MD;  Location: [facility];  Service: Orthopedics;  Laterality: Left;  . TOTAL HIP ARTHROPLASTY Right 09/13/2017   Procedure: RIGHT TOTAL HIP ARTHROPLASTY ANTERIOR APPROACH;  Surgeon: Mcarthur Rossetti, MD;  Location: [facility];  Service: Orthopedics;  Laterality: Right;  Marland Kitchen VASECTOMY     Social History   Socioeconomic History  . Marital status: Married    Spouse name: Not on file  . Number of children: 2  . Years of education: Not on file  . Highest education level: Not on file  Occupational History  . Occupation: Insurance agent---retired    Comment: [employer] and worker's comp  Tobacco Use  . Smoking status: Former Smoker    Quit date: 01/18/1973    Years since quitting: 46.5  . Smokeless tobacco: Never Used  Vaping Use  . Vaping Use: Never used  Substance and Sexual Activity  . Alcohol use: Yes    Alcohol/week: 7.0 standard drinks    Types: 4 Glasses of wine, 3 Standard drinks or equivalent per week    Comment: 1 drink a day - either wine or cocktail  . Drug use: No  . Sexual activity: Yes  Other Topics Concern  . Not on file  Social History Narrative   2 daughters   Married 1961   Partially retired from Insurance underwriter, still consulting some as of 2020      Has living will   Wife, then daughters equally if wife were incapacitated, has health care POA.   Would accept resuscitation attempts   Not sure about tube feeds   Social Determinants of Health   Financial Resource Strain: Low Risk   . Difficulty of Paying Living Expenses: Not hard at all  Food Insecurity: No Food Insecurity  . Worried About Charity fundraiser in the Last Year: Never true  . Ran Out of Food in the Last Year: Never true  Transportation Needs: No Transportation Needs  . Lack of Transportation (Medical): No  . Lack of Transportation (Non-Medical): No  Physical Activity: Sufficiently  Active  . Days of Exercise per Week: 3 days  . Minutes of Exercise per Session: 60 min  Stress: No Stress Concern Present  . Feeling of Stress : Not at all  Social Connections:   . Frequency of Communication with Friends and Family:   . Frequency of Social Gatherings with Friends and Family:   . Attends Religious Services:   . Active Member of Clubs or Organizations:   . Attends Archivist Meetings:   Marland Kitchen Marital Status:   Intimate Partner Violence: Not At Risk  . Fear of Current or Ex-Partner: No  . Emotionally Abused: No  . Physically Abused: No  . Sexually Abused: No   Family History  Problem Relation Age of Onset  . Heart disease Father   . Diabetes Father   . Heart attack Father   . Celiac disease Sister   . Heart disease Brother  MI then stents  . Heart attack Brother   . Stroke Brother   . Cancer Paternal Aunt   . Prostate cancer Neg Hx   . Colon cancer Neg Hx   . Esophageal cancer Neg Hx   . Inflammatory bowel disease Neg Hx   . Liver disease Neg Hx   . Pancreatic cancer Neg Hx   . Rectal cancer Neg Hx   . Stomach cancer Neg Hx    I have reviewed his medical, social, and family history in detail and updated the electronic medical record as necessary.    PHYSICAL EXAMINATION  BP 132/80   Pulse 63   Ht 6\' 2"  (1.88 m)   Wt 200 lb 6.4 oz (90.9 kg)   SpO2 98%   BMI 25.73 kg/m  Wt Readings from Last 3 Encounters:  07/13/19 200 lb 6.4 oz (90.9 kg)  06/04/19 198 lb (89.8 kg)  04/11/19 209 lb (94.8 kg)  GEN: NAD, appears stated age, doesn't appear chronically ill PSYCH: Cooperative, without pressured speech EYE: Conjunctivae pink, sclerae anicteric ENT: MMM, without oral ulcers, no erythema or exudates noted CV: Nontachycardic RESP: No audible wheezing GI: NABS, soft, NT/ND, without rebound MSK/EXT: No lower extremity edema SKIN: No jaundice NEURO:  Alert & Oriented x 3, no focal deficits   REVIEW OF DATA  I reviewed the following data at  the time of this encounter:  GI Procedures and Studies  May 2021 EGD - Extrinsic compression in the proximal esophagus. - Patient's dysphagia symptoms may be due to dysphagia lusoria - Salmon-colored mucosa suspicious for short-segment Barrett's esophagus. Biopsied. - Thickened folds mucosa in the antrum. Biopsied. - A few gastric polyps. Biopsied. - A single gastric polyp. Biopsied. - A single duodenal polyp. Biopsied. - Scalloped mucosa was found in the duodenum. Biopsied. Pathology DIAGNOSIS:  A. DUODENUM, SCALLOPED MUCOSA; COLD BIOPSY:  - DUODENAL ADENOMA (LOW GRADE DYSPLASIA).  - NEGATIVE FOR HIGH-GRADE DYSPLASIA AND MALIGNANCY.  B. DUODENUM, NODULE; COLD BIOPSY:  - DUODENAL MUCOSA WITH BRUNNER GLAND HYPERPLASIA AND FOCAL PYLORIC METAPLASIA, CONSISTENT WITH PEPTIC DUODENITIS.  - NEGATIVE FOR DYSPLASIA AND MALIGNANCY.  C. STOMACH POLYP; COLD BIOPSY:  - FUNDIC GLAND POLYP.  - NEGATIVE FOR DYSPLASIA AND MALIGNANCY.  D. STOMACH, ANTRUM THICKENED FOLD; COLD BIOPSY:  - REACTIVE GASTROPATHY.  - NEGATIVE FOR ACTIVE INFLAMMATION AND H PYLORI.  - NEGATIVE FOR DYSPLASIA AND MALIGNANCY.  E. STOMACH POLYP, ERYTHEMATOUS; COLD BIOPSY:  - FUNDIC GLAND POLYP WITH CONGESTED LAMINA PROPRIA.  - NEGATIVE FOR DYSPLASIA AND MALIGNANCY.  F. GEJ, SALMON-COLORED MUCOSA; COLD BIOPSY:  - SQUAMOCOLUMNAR JUNCTION WITH CHRONIC INFLAMMATION AND REACTIVE CHANGES.  - NEGATIVE FOR INTESTINAL METAPLASIA, DYSPLASIA, AND MALIGNANCY.  G. ESOPHAGUS; COLD BIOPSY:  - SQUAMOUS MUCOSA WITH NO SIGNIFICANT PATHOLOGIC ALTERATION.  - NEGATIVE FOR INCREASED EOSINOPHILS.  - NEGATIVE FOR INTESTINAL METAPLASIA, DYSPLASIA, AND MALIGNANCY.  Laboratory Studies  Reviewed those in epic  Imaging Studies  June 2021 CT angio neck with contrast IMPRESSION: Negative for mass or adenopathy in neck Mild degenerative changes cervical spine. Mild to moderate anterior spurring in the upper thoracic spine which could account for  the endoscopic findings. No significant carotid or vertebral artery stenosis in the neck. Mild atherosclerotic disease of the carotid bifurcation bilaterally.   ASSESSMENT  Mr. Creighton is a 79 y.o. male with a pmh significant for hypertension, hyperlipidemia, nephrolithiasis, osteoarthritis, prior HCV infection status post treatment, gastritis/duodenitis, duodenal adenoma.  The patient is seen today for evaluation and management of:  1. Duodenal adenoma  2. Duodenal nodule   3. Abnormal endoscopy of upper gastrointestinal tract   4. Esophageal dysphagia    The patient is hemodynamically stable.  From a clinical perspective his dysphagia is unchanged.  Imaging suggestive of likely osteophytes as etiology of potential dysphagia.  Although potential for dysphagia lusoria seems less likely now.  He has an adenoma that requires need for resection.  He has a duodenal nodule that returned as a Brunner's gland hyperplasia that was abnormal appearing.  I think it is reasonable for [place] to consider potential resection of both of these areas in the duodenum.  Based upon the description and endoscopic pictures I do feel that it is reasonable to pursue an Advanced Polypectomy attempt of the lesions.  We discussed some of the techniques of advanced polypectomy which include Endoscopic Mucosal Resection, OVESCO Full-Thickness Resection, Endorotor Morcellation, and Tissue Ablation via Fulguration.  The risks and benefits of endoscopic evaluation were discussed with the patient; these include but are not limited to the risk of perforation, infection, bleeding, missed lesions, lack of diagnosis, severe illness requiring hospitalization, as well as anesthesia and sedation related illnesses.  During attempts at advanced resection, the risks of bleeding and perforation/leak are increased as opposed to diagnostic and screening procedures, and that was discussed with the patient as well.   In addition, I explained that with the  possible need for piecemeal resection, subsequent short-interval endoscopic evaluation for follow up and potential retreatment of the lesions/areas may be necessary. If, after attempt at removal of the polyp/lesion, it is found that the patient has a complication or that an invasive lesion or malignant lesion is found, or that the polyp/lesion continues to recur, the patient is aware and understands that surgery may still be indicated/required.  All patient questions were answered, to the best of my ability, and the patient agrees to the aforementioned plan of action with follow-up as indicated.  I will consider an endoscopic ultrasound to be available based on how the lesions look.  The risks of an EUS including intestinal perforation, bleeding, infection, aspiration, and medication effects were discussed as was the possibility it may not give a definitive diagnosis if a biopsy is performed.  When a biopsy of the pancreas is done as part of the EUS, there is an additional risk of pancreatitis at the rate of about 1-2%.  It was explained that procedure related pancreatitis is typically mild, although it can be severe and even life threatening, which is why we do not perform random pancreatic biopsies and only biopsy a lesion/area we feel is concerning enough to warrant the risk.  All patient questions were answered, to the best of my ability, and the patient agrees to the aforementioned plan of action with follow-up as indicated.   PLAN  Preprocedure labs as outlined below Proceed with scheduling EGD/EUS in the coming months Follow-up with Dr. Bonna Gains in the interim if further GI issues occur   Orders Placed This Encounter  Procedures  . Procedural/ Surgical Case Request: ENDOSCOPIC MUCOSAL RESECTION, ESOPHAGOGASTRODUODENOSCOPY (EGD) WITH PROPOFOL, UPPER ESOPHAGEAL ENDOSCOPIC ULTRASOUND (EUS)  . CBC  . INR/PT  . Basic Metabolic Panel (BMET)  . Ambulatory referral to Gastroenterology    New  Prescriptions   No medications on file   Modified Medications   No medications on file    Planned Follow Up No follow-ups on file.   Total Time in Face-to-Face and in Coordination of Care for patient including independent/personal interpretation/review of prior testing, medical history, examination,  medication adjustment, communicating results with the patient directly, and documentation with the EHR is 45 minutes.   Justice Britain, MD [facility] Advanced Endoscopy Office # [phone]

## 2019-09-06 ENCOUNTER — Other Ambulatory Visit
Admission: RE | Admit: 2019-09-06 | Discharge: 2019-09-06 | Disposition: A | Discharge status: Home / Self Care | Payer: Medicare Other | Source: Ambulatory Visit | Attending: Gastroenterology | Admitting: Gastroenterology

## 2019-09-06 ENCOUNTER — Other Ambulatory Visit: Payer: Self-pay

## 2019-09-06 DIAGNOSIS — Z20822 Contact with and (suspected) exposure to covid-19: Secondary | ICD-10-CM | POA: Insufficient documentation

## 2019-09-06 DIAGNOSIS — Z01812 Encounter for preprocedural laboratory examination: Secondary | ICD-10-CM | POA: Insufficient documentation

## 2019-09-07 ENCOUNTER — Encounter (HOSPITAL_COMMUNITY): Payer: Self-pay | Admitting: Gastroenterology

## 2019-09-07 ENCOUNTER — Other Ambulatory Visit: Payer: Self-pay

## 2019-09-07 LAB — SARS CORONAVIRUS 2 (TAT 6-24 HRS): SARS Coronavirus 2: NEGATIVE

## 2019-09-07 NOTE — Progress Notes (Signed)
Pt denies SOB, chest pain, and being under the care of a cardiologist. Pt stated that he is under the care of Dr. Damita Dunnings, PCP. Pt denies having a cardiac cath. Pt denies having an EKG and chest x ray in the last year. Pt denies recent labs. Pt made aware to stop taking  Aspirin (unless otherwise advised by surgeon), vitamins, fish oil and herbal medications. Do not take any NSAIDs ie: Ibuprofen, Advil, Naproxen (Aleve), Motrin, BC and Goody Powder. Pt reminded to quarantine. Pt verbalized understanding of all pre-op instructions.

## 2019-09-10 ENCOUNTER — Encounter (HOSPITAL_COMMUNITY): Payer: Self-pay | Admitting: Gastroenterology

## 2019-09-10 ENCOUNTER — Encounter (HOSPITAL_COMMUNITY)
Admission: RE | Disposition: A | Discharge status: Home / Self Care | Payer: Self-pay | Source: Home / Self Care | Attending: Gastroenterology

## 2019-09-10 ENCOUNTER — Ambulatory Visit (HOSPITAL_COMMUNITY): Payer: Medicare Other | Admitting: Physician Assistant

## 2019-09-10 ENCOUNTER — Other Ambulatory Visit: Payer: Self-pay

## 2019-09-10 ENCOUNTER — Ambulatory Visit (HOSPITAL_COMMUNITY)
Admission: RE | Admit: 2019-09-10 | Discharge: 2019-09-10 | Disposition: A | Discharge status: Home / Self Care | Payer: Medicare Other | Attending: Gastroenterology | Admitting: Gastroenterology

## 2019-09-10 DIAGNOSIS — Z87442 Personal history of urinary calculi: Secondary | ICD-10-CM | POA: Diagnosis not present

## 2019-09-10 DIAGNOSIS — K228 Other specified diseases of esophagus: Secondary | ICD-10-CM | POA: Diagnosis not present

## 2019-09-10 DIAGNOSIS — Z87891 Personal history of nicotine dependence: Secondary | ICD-10-CM | POA: Insufficient documentation

## 2019-09-10 DIAGNOSIS — Z8249 Family history of ischemic heart disease and other diseases of the circulatory system: Secondary | ICD-10-CM | POA: Insufficient documentation

## 2019-09-10 DIAGNOSIS — Z809 Family history of malignant neoplasm, unspecified: Secondary | ICD-10-CM | POA: Insufficient documentation

## 2019-09-10 DIAGNOSIS — E785 Hyperlipidemia, unspecified: Secondary | ICD-10-CM | POA: Diagnosis not present

## 2019-09-10 DIAGNOSIS — Z8619 Personal history of other infectious and parasitic diseases: Secondary | ICD-10-CM | POA: Insufficient documentation

## 2019-09-10 DIAGNOSIS — Z833 Family history of diabetes mellitus: Secondary | ICD-10-CM | POA: Diagnosis not present

## 2019-09-10 DIAGNOSIS — Z96643 Presence of artificial hip joint, bilateral: Secondary | ICD-10-CM | POA: Insufficient documentation

## 2019-09-10 DIAGNOSIS — M1990 Unspecified osteoarthritis, unspecified site: Secondary | ICD-10-CM | POA: Diagnosis not present

## 2019-09-10 DIAGNOSIS — Z8379 Family history of other diseases of the digestive system: Secondary | ICD-10-CM | POA: Diagnosis not present

## 2019-09-10 DIAGNOSIS — D132 Benign neoplasm of duodenum: Secondary | ICD-10-CM | POA: Diagnosis not present

## 2019-09-10 DIAGNOSIS — Z981 Arthrodesis status: Secondary | ICD-10-CM | POA: Insufficient documentation

## 2019-09-10 DIAGNOSIS — K3189 Other diseases of stomach and duodenum: Secondary | ICD-10-CM

## 2019-09-10 DIAGNOSIS — I10 Essential (primary) hypertension: Secondary | ICD-10-CM | POA: Diagnosis not present

## 2019-09-10 DIAGNOSIS — Z823 Family history of stroke: Secondary | ICD-10-CM | POA: Insufficient documentation

## 2019-09-10 DIAGNOSIS — K317 Polyp of stomach and duodenum: Secondary | ICD-10-CM | POA: Insufficient documentation

## 2019-09-10 HISTORY — PX: SUBMUCOSAL LIFTING INJECTION: SHX6855

## 2019-09-10 HISTORY — PX: ESOPHAGOGASTRODUODENOSCOPY (EGD) WITH PROPOFOL: SHX5813

## 2019-09-10 HISTORY — DX: Benign neoplasm of duodenum: D13.2

## 2019-09-10 HISTORY — PX: ENDOSCOPIC MUCOSAL RESECTION: SHX6839

## 2019-09-10 HISTORY — PX: SUBMUCOSAL TATTOO INJECTION: SHX6856

## 2019-09-10 HISTORY — DX: Presence of spectacles and contact lenses: Z97.3

## 2019-09-10 HISTORY — PX: UPPER ESOPHAGEAL ENDOSCOPIC ULTRASOUND (EUS): SHX6562

## 2019-09-10 HISTORY — PX: HEMOSTASIS CLIP PLACEMENT: SHX6857

## 2019-09-10 HISTORY — PX: POLYPECTOMY: SHX5525

## 2019-09-10 HISTORY — PX: ESOPHAGEAL DILATION: SHX303

## 2019-09-10 SURGERY — ESOPHAGOGASTRODUODENOSCOPY (EGD) WITH PROPOFOL
Anesthesia: Monitor Anesthesia Care

## 2019-09-10 MED ORDER — SODIUM CHLORIDE 0.9 % IV SOLN: INTRAVENOUS | Status: DC

## 2019-09-10 MED ORDER — PROPOFOL 10 MG/ML IV BOLUS
INTRAVENOUS | Status: DC | PRN
  Administered 2019-09-10: 30 mg via INTRAVENOUS

## 2019-09-10 MED ORDER — SPOT INK MARKER SYRINGE KIT
PACK | SUBMUCOSAL | Status: DC | PRN
  Administered 2019-09-10: 3 mL via SUBMUCOSAL

## 2019-09-10 MED ORDER — OMEPRAZOLE 40 MG PO CPDR: 40.0000 mg | DELAYED_RELEASE_CAPSULE | Freq: Two times a day (BID) | ORAL | 3 refills | Status: DC

## 2019-09-10 MED ORDER — LACTATED RINGERS IV SOLN: INTRAVENOUS | Status: DC

## 2019-09-10 MED ORDER — PROPOFOL 500 MG/50ML IV EMUL
INTRAVENOUS | Status: DC | PRN
  Administered 2019-09-10: 125 ug/kg/min via INTRAVENOUS

## 2019-09-10 MED ORDER — SPOT INK MARKER SYRINGE KIT
PACK | SUBMUCOSAL | Status: AC
  Filled 2019-09-10: qty 5

## 2019-09-10 SURGICAL SUPPLY — 14 items

## 2019-09-10 NOTE — Op Note (Signed)
[facility] Patient Name: Victor Ball Procedure Date : 09/10/2019 MRN: [id number] Attending MD: Justice Britain , MD Date of Birth: 13-Jul-1940 CSN: [id number] Age: 79 Admit Type: Outpatient Procedure:                Upper EUS Indications:              Esophageal deformity on endoscopy/Subepithelial                            tumor vs. extrinsic compression, Polyps in the                            duodenum, For therapy of polyps in the duodenum Providers:                Justice Britain, MD, Janee Morn,                            Technician, Jeanella Cara, RN Referring MD:             Lennette Bihari. Bonna Gains MD, MD Medicines:                Monitored Anesthesia Care Complications:            No immediate complications. Estimated Blood Loss:     Estimated blood loss was minimal. Procedure:                Pre-Anesthesia Assessment:                           - Prior to the procedure, a History and Physical                            was performed, and patient medications and                            allergies were reviewed. The patient's tolerance of                            previous anesthesia was also reviewed. The risks                            and benefits of the procedure and the sedation                            options and risks were discussed with the patient.                            All questions were answered, and informed consent                            was obtained. Prior Anticoagulants: The patient has                            taken no previous anticoagulant or antiplatelet  agents. ASA Grade Assessment: II - A patient with                            mild systemic disease. After reviewing the risks                            and benefits, the patient was deemed in                            satisfactory condition to undergo the procedure.                           After obtaining informed consent, the  endoscope was                            passed under direct vision. Throughout the                            procedure, the patient's blood pressure, pulse, and                            oxygen saturations were monitored continuously. The                            [id number] ([id number]) Olympus therapeutic                            gastroscope was introduced through the mouth, and                            advanced to the second part of duodenum. The                            [id number] ([id number]) Olympus Radial EUS scope was                            introduced through the mouth, and advanced to the                            duodenum for ultrasound examination. The upper EUS                            was accomplished without difficulty. The patient                            tolerated the procedure. Scope In: Scope Out: Findings:      ENDOSCOPIC FINDING: :      No gross lesions were noted in the entire esophagus. After the       completion of the EGD and EUS, a guidewire was placed and the scope was       withdrawn. Dilation was performed with a Savary dilator with no       resistance at 16 mm and mild resistance at 18 mm.      Multiple small sessile polyps  with no bleeding and no stigmata of recent       bleeding were found in the gastric fundus and in the gastric body.      A single 15 mm mucosal nodule was found in the D1/D2 sweep. After the       EUS was completed as below, preparations were made for mucosal       resection. NBI and White-light endoscopy was done to demarcate the       borders of the lesion. Orise gel was injected to raise the lesion. Snare       mucosal resection was performed. Resection and retrieval were complete.       To prevent bleeding after mucosal resection, three hemostatic clips were       successfully placed (MR conditional). There was no bleeding during, or       at the end, of the procedure. Area was tattooed with an injection of       Spot  (carbon black) proximal to region for demarcation purposes.      A single 10 mm sessile polyp was found in the third portion of the       duodenum. Preparations were made for mucosal resection. NBI and White       light was done to mark the borders of the lesion. Saline was injected to       raise the lesion. Piecemeal mucosal resection using a snare was       performed. Resection and retrieval were complete. To prevent bleeding       after mucosal resection, two hemostatic clips were successfully placed       (MR conditional). There was no bleeding at the end of the procedure.       Area was tattooed with an injection of Spot (carbon black) just proximal       to resection.      ENDOSONOGRAPHIC FINDING: :      There was no sign of significant endosonographic abnormality in the       cervical esophagus and in the thoracic esophagus. No masses were       identified.      A pedunculated intramural (subepithelial) lesion was found in the second       portion of the duodenum. The lesion was hypoechoic. Endosonographically,       the lesion appeared to originate from within the luminal       interface/superficial mucosa (Layer 1) and deep mucosa (Layer 2). The       lesion measured 18 mm (in maximum thickness). The outer margins were       well defined. Impression:               EGD Impression:                           - No gross lesions in esophagus. Dilated with                            evidence of upper esophageal wrent.                           - Multiple gastric polyps.                           - Mucosal nodule found in the  duodenum - previously                            biopsied as Brunner's gland hyperplasia Complete                            removal was accomplished via mucosal resection.                            Clips (MR conditional) were placed. Tattooed.                           - A single duodenal adenoma. Resected and retrieved                            via mucosal  resection. Clips (MR conditional) were                            placed. Tattooed.                           EUS Impression:                           - There was no sign of significant pathology in the                            cervical esophagus and in the thoracic esophagus.                           - An intramural (subepithelial) lesion was found in                            the second portion of the duodenum. The lesion                            appeared to originate from within the luminal                            interface/superficial mucosa (Layer 1) and deep                            mucosa (Layer 2). Tissue has not been obtained.                            However, the endosonographic appearance is                            suggestive of a superficial Recommendation:           - The patient will be observed post-procedure,                            until all discharge criteria are met.                           -  Discharge patient to home.                           - Patient has a contact number available for                            emergencies. The signs and symptoms of potential                            delayed complications were discussed with the                            patient. Return to normal activities tomorrow.                            Written discharge instructions were provided to the                            patient.                           - Full liquid diet today and advance to soft diet                            tomorrow.                           - Start Omeprazole 40 mg twice daily for 1 month                            and then decrease to 40 mg daily for 1 month and                            then decrease to 20 mg daily and then come off.                           - Observe patient's clinical course.                           - Await path results.                           - Repeat the EGD in 6-12 months for surveillance                             based on final results (mostly for adenoma                            surveillance - though if Brunneroma returns can                            re-evaluate this but this is non-malignant).                           -  No aspirin, ibuprofen, naproxen, or other                            non-steroidal anti-inflammatory drugs for 2 weeks.                           - The findings and recommendations were discussed                            with the patient. Procedure Code(s):        --- Professional ---                           [phone], Esophagogastroduodenoscopy, flexible,                            transoral; with endoscopic mucosal resection                           43237, Esophagogastroduodenoscopy, flexible,                            transoral; with endoscopic ultrasound examination                            limited to the esophagus, stomach or duodenum, and                            adjacent structures                           43248, Esophagogastroduodenoscopy, flexible,                            transoral; with insertion of guide wire followed by                            passage of dilator(s) through esophagus over guide                            wire Diagnosis Code(s):        --- Professional ---                           K31.7, Polyp of stomach and duodenum                           K31.89, Other diseases of stomach and duodenum                           K22.8, Other specified diseases of esophagus CPT copyright 2019 [employer]. All rights reserved. The codes documented in this report are preliminary and upon coder review may  be revised to meet current compliance requirements. Justice Britain, MD 09/10/2019 4:44:46 PM Number of Addenda: 0

## 2019-09-10 NOTE — Transfer of Care (Signed)
Immediate Anesthesia Transfer of Care Note  Patient: Victor Ball  Procedure(s) Performed: ESOPHAGOGASTRODUODENOSCOPY (EGD) WITH PROPOFOL (N/A ) UPPER ESOPHAGEAL ENDOSCOPIC ULTRASOUND (EUS) (N/A ) ENDOSCOPIC MUCOSAL RESECTION (N/A ) SUBMUCOSAL LIFTING INJECTION POLYPECTOMY FOREIGN BODY REMOVAL SUBMUCOSAL TATTOO INJECTION HEMOSTASIS CLIP PLACEMENT ESOPHAGEAL DILATION  Patient Location: Endoscopy Unit  Anesthesia Type:MAC  Level of Consciousness: awake and drowsy  Airway & Oxygen Therapy: Patient Spontanous Breathing and Patient connected to nasal cannula oxygen  Post-op Assessment: Report given to RN and Post -op Vital signs reviewed and stable  Post vital signs: Reviewed and stable  Last Vitals:  Vitals Value Taken Time  BP    Temp    Pulse    Resp    SpO2      Last Pain:  Vitals:   09/10/19 1227  TempSrc: Oral  PainSc:          Complications: No complications documented.

## 2019-09-10 NOTE — Anesthesia Preprocedure Evaluation (Addendum)
Anesthesia Evaluation  Patient identified by MRN, date of birth, ID band Patient awake    Reviewed: Allergy & Precautions, NPO status , Patient's Chart, lab work & pertinent test results  History of Anesthesia Complications Negative for: history of anesthetic complications  Airway Mallampati: I  TM Distance: >3 FB Neck ROM: Full    Dental  (+) Dental Advisory Given   Pulmonary neg pulmonary ROS, former smoker,    breath sounds clear to auscultation       Cardiovascular hypertension, Pt. on medications (-) angina(-) Past MI and (-) CHF  Rhythm:Regular     Neuro/Psych negative neurological ROS  negative psych ROS   GI/Hepatic negative GI ROS, (+) Hepatitis -, CDysphagia with pills   Endo/Other    Renal/GU Renal disease     Musculoskeletal  (+) Arthritis ,   Abdominal   Peds  Hematology   Anesthesia Other Findings   Reproductive/Obstetrics                             Anesthesia Physical  Anesthesia Plan  ASA: II  Anesthesia Plan: MAC   Post-op Pain Management:    Induction: Intravenous  PONV Risk Score and Plan: 2 and Ondansetron, Propofol infusion and TIVA  Airway Management Planned: Nasal Cannula  Additional Equipment: None  Intra-op Plan:   Post-operative Plan:   Informed Consent: I have reviewed the patients History and Physical, chart, labs and discussed the procedure including the risks, benefits and alternatives for the proposed anesthesia with the patient or authorized representative who has indicated his/her understanding and acceptance.     Dental advisory given  Plan Discussed with: CRNA and Surgeon  Anesthesia Plan Comments:        Anesthesia Quick Evaluation

## 2019-09-10 NOTE — H&P (Signed)
GASTROENTEROLOGY PROCEDURE H&P NOTE   Primary Care Physician: Tonia Ghent, MD  HPI: Victor Ball is a 79 y.o. male who presents for evaluation of Duodenal Adenoma resection, Duodenal bulb nodule for EUS and possible resection, and evaluation via EUS for potential dysphagia lusoria.  Past Medical History:  Diagnosis Date   Duodenal adenoma    duodenal nodule ( copied from order for consent)   Hepatitis C, chronic ([place])    treated prev with Harvoni, tested and cleared   History of kidney stones    Hyperlipidemia    Hypertension    Wears glasses    Past Surgical History:  Procedure Laterality Date   APPENDECTOMY  1948   ESOPHAGOGASTRODUODENOSCOPY (EGD) WITH PROPOFOL N/A 06/04/2019   Procedure: ESOPHAGOGASTRODUODENOSCOPY (EGD) WITH PROPOFOL;  Surgeon: Virgel Manifold, MD;  Location: [facility];  Service: Endoscopy;  Laterality: N/A;   JOINT REPLACEMENT     bilateral hip replacement   LIVER BIOPSY     PROSTATE BIOPSY     neg, x2   teeth extractions     TOE FUSION  1985   left great toe   TONSILLECTOMY AND ADENOIDECTOMY  1945   TOTAL HIP ARTHROPLASTY Left 11/09/2016   Procedure: LEFT TOTAL HIP ARTHROPLASTY ANTERIOR APPROACH;  Surgeon: Mcarthur Rossetti, MD;  Location: [place];  Service: Orthopedics;  Laterality: Left;   TOTAL HIP ARTHROPLASTY Right 09/13/2017   Procedure: RIGHT TOTAL HIP ARTHROPLASTY ANTERIOR APPROACH;  Surgeon: Mcarthur Rossetti, MD;  Location: [place];  Service: Orthopedics;  Laterality: Right;   VASECTOMY     Current Facility-Administered Medications  Medication Dose Route Frequency Provider Last Rate Last Admin   0.9 %  sodium chloride infusion   Intravenous Continuous Mansouraty, Telford Nab., MD       lactated ringers infusion   Intravenous Continuous Mansouraty, Telford Nab., MD 10 mL/hr at 09/10/19 1236 New Bag at 09/10/19 1236   No Known Allergies Family History  Problem Relation Age of Onset   Heart disease  Father    Diabetes Father    Heart attack Father    Celiac disease Sister    Heart disease Brother        MI then stents   Heart attack Brother    Stroke Brother    Cancer Paternal Aunt    Prostate cancer Neg Hx    Colon cancer Neg Hx    Esophageal cancer Neg Hx    Inflammatory bowel disease Neg Hx    Liver disease Neg Hx    Pancreatic cancer Neg Hx    Rectal cancer Neg Hx    Stomach cancer Neg Hx    Social History   Socioeconomic History   Marital status: Married    Spouse name: Not on file   Number of children: 2   Years of education: Not on file   Highest education level: Not on file  Occupational History   Occupation: Insurance agent---retired    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  Tobacco Use   Smoking status: Former Smoker    Types: Cigarettes    Quit date: 01/18/1973    Years since quitting: 46.6   Smokeless tobacco: Never Used  Vaping Use   Vaping Use: Never used  Substance and Sexual Activity   Alcohol use: Yes    Alcohol/week: 7.0 standard drinks    Types: 4 Glasses of wine, 3 Standard drinks or equivalent per week    Comment: 1 drink x 6 days - either  wine or cocktail   Drug use: No   Sexual activity: Yes  Other Topics Concern   Not on file  Social History Narrative   2 daughters   Married 1961   Partially retired from Insurance underwriter, still consulting some as of 2020      Has living will   Wife, then daughters equally if wife were incapacitated, has health care POA.   Would accept resuscitation attempts   Not sure about tube feeds   Social Determinants of Health   Financial Resource Strain: Low Risk    Difficulty of Paying Living Expenses: Not hard at all  Food Insecurity: No Food Insecurity   Worried About Charity fundraiser in the Last Year: Never true   [place] in the Last Year: Never true  Transportation Needs: No Transportation Needs   Lack of Transportation (Medical): No   Lack of  Transportation (Non-Medical): No  Physical Activity: Sufficiently Active   Days of Exercise per Week: 3 days   Minutes of Exercise per Session: 60 min  Stress: No Stress Concern Present   Feeling of Stress : Not at all  Social Connections:    Frequency of Communication with Friends and Family: Not on file   Frequency of Social Gatherings with Friends and Family: Not on file   Attends Religious Services: Not on file   Active Member of Clubs or Organizations: Not on file   Attends Archivist Meetings: Not on file   Marital Status: Not on file  Intimate Partner Violence: Not At Risk   Fear of Current or Ex-Partner: No   Emotionally Abused: No   Physically Abused: No   Sexually Abused: No    Physical Exam: Vital signs in last 24 hours: Temp:  [98 F (36.7 C)] 98 F (36.7 C) (08/23 1227) Pulse Rate:  [53] 53 (08/23 1227) Resp:  [11] 11 (08/23 1227) BP: (186)/(68) 186/68 (08/23 1227) SpO2:  [99 %] 99 % (08/23 1227) Weight:  [88 kg] 88 kg (08/23 1200)   GEN: NAD EYE: Sclerae anicteric ENT: MMM CV: Non-tachycardic GI: Soft, NT/ND NEURO:  Alert & Oriented x 3  Lab Results: No results for input(s): WBC, HGB, HCT, PLT in the last 72 hours. BMET No results for input(s): NA, K, CL, CO2, GLUCOSE, BUN, CREATININE, CALCIUM in the last 72 hours. LFT No results for input(s): PROT, ALBUMIN, AST, ALT, ALKPHOS, BILITOT, BILIDIR, IBILI in the last 72 hours. PT/INR No results for input(s): LABPROT, INR in the last 72 hours.   Impression / Plan: This is a 79 y.o.male who presents for evaluation of Duodenal Adenoma resection, Duodenal bulb nodule for EUS and possible resection, and evaluation via EUS for potential dysphagia lusoria.  The risks and benefits of endoscopic evaluation were discussed with the patient; these include but are not limited to the risk of perforation, infection, bleeding, missed lesions, lack of diagnosis, severe illness requiring  hospitalization, as well as anesthesia and sedation related illnesses.  The patient is agreeable to proceed.    Justice Britain, MD [facility] Office # [phone]

## 2019-09-10 NOTE — Anesthesia Postprocedure Evaluation (Signed)
Anesthesia Post Note  Patient: Victor Ball  Procedure(s) Performed: ESOPHAGOGASTRODUODENOSCOPY (EGD) WITH PROPOFOL (N/A ) UPPER ESOPHAGEAL ENDOSCOPIC ULTRASOUND (EUS) (N/A ) ENDOSCOPIC MUCOSAL RESECTION (N/A ) SUBMUCOSAL LIFTING INJECTION POLYPECTOMY FOREIGN BODY REMOVAL SUBMUCOSAL TATTOO INJECTION HEMOSTASIS CLIP PLACEMENT ESOPHAGEAL DILATION     Patient location during evaluation: PACU Anesthesia Type: MAC Level of consciousness: awake and alert Pain management: pain level controlled Vital Signs Assessment: post-procedure vital signs reviewed and stable Respiratory status: spontaneous breathing Cardiovascular status: stable Anesthetic complications: no   No complications documented.  Last Vitals:  Vitals:   09/10/19 1631 09/10/19 1642  BP: (!) 190/66 (!) 153/68  Pulse: (!) 48 (!) 56  Resp: 15 17  Temp:    SpO2: 99% 99%    Last Pain:  Vitals:   09/10/19 1642  TempSrc:   PainSc: 0-No pain                 Nolon Nations

## 2019-09-10 NOTE — Anesthesia Procedure Notes (Signed)
Procedure Name: MAC Date/Time: 09/10/2019 2:50 PM Performed by: Inda Coke, CRNA Pre-anesthesia Checklist: Patient identified, Emergency Drugs available, Suction available, Timeout performed and Patient being monitored Patient Re-evaluated:Patient Re-evaluated prior to induction Oxygen Delivery Method: Nasal cannula Induction Type: IV induction Dental Injury: Teeth and Oropharynx as per pre-operative assessment

## 2019-09-10 NOTE — Discharge Instructions (Signed)
YOU HAD AN ENDOSCOPIC PROCEDURE TODAY: Refer to the procedure report and other information in the discharge instructions given to you for any specific questions about what was found during the examination. If this information does not answer your questions, please call [place] office at [phone] to clarify.  ° °YOU SHOULD EXPECT: Some feelings of bloating in the abdomen. Passage of more gas than usual. Walking can help get rid of the air that was put into your GI tract during the procedure and reduce the bloating. If you had a lower endoscopy (such as a colonoscopy or flexible sigmoidoscopy) you may notice spotting of blood in your stool or on the toilet paper. Some abdominal soreness may be present for a day or two, also. ° °DIET: Your first meal following the procedure should be a light meal and then it is ok to progress to your normal diet. A half-sandwich or bowl of soup is an example of a good first meal. Heavy or fried foods are harder to digest and may make you feel nauseous or bloated. Drink plenty of fluids but you should avoid alcoholic beverages for 24 hours. If you had a esophageal dilation, please see attached instructions for diet.   ° °ACTIVITY: Your care partner should take you home directly after the procedure. You should plan to take it easy, moving slowly for the rest of the day. You can resume normal activity the day after the procedure however YOU SHOULD NOT DRIVE, use power tools, machinery or perform tasks that involve climbing or major physical exertion for 24 hours (because of the sedation medicines used during the test).  ° °SYMPTOMS TO REPORT IMMEDIATELY: °A gastroenterologist can be reached at any hour. Please call [phone]  for any of the following symptoms:  °Following lower endoscopy (colonoscopy, flexible sigmoidoscopy) °Excessive amounts of blood in the stool  °Significant tenderness, worsening of abdominal pains  °Swelling of the abdomen that is new, acute  °Fever of 100° or  higher  °Following upper endoscopy (EGD, EUS, ERCP, esophageal dilation) °Vomiting of blood or coffee ground material  °New, significant abdominal pain  °New, significant chest pain or pain under the shoulder blades  °Painful or persistently difficult swallowing  °New shortness of breath  °Black, tarry-looking or red, bloody stools ° °FOLLOW UP:  °If any biopsies were taken you will be contacted by phone or by letter within the next 1-3 weeks. Call [phone]  if you have not heard about the biopsies in 3 weeks.  °Please also call with any specific questions about appointments or follow up tests. ° °

## 2019-09-11 ENCOUNTER — Encounter (HOSPITAL_COMMUNITY): Payer: Self-pay | Admitting: Gastroenterology

## 2019-09-11 NOTE — Progress Notes (Signed)
Attempted, unable to leave message/bt 

## 2019-09-12 LAB — SURGICAL PATHOLOGY

## 2019-09-13 ENCOUNTER — Encounter: Payer: Self-pay | Admitting: Gastroenterology

## 2019-09-25 ENCOUNTER — Other Ambulatory Visit: Payer: Self-pay

## 2019-09-25 ENCOUNTER — Other Ambulatory Visit: Payer: Medicare Other

## 2019-09-25 DIAGNOSIS — R972 Elevated prostate specific antigen [PSA]: Secondary | ICD-10-CM

## 2019-09-25 NOTE — Progress Notes (Signed)
Virtual Visit via Video Note  I connected with Victor Ball on 09/26/2019 at  4:00 PM EDT by a video enabled telemedicine application and verified that I am speaking with the correct person using two identifiers.  Location: Patient: Home Provider: Office   I discussed the limitations of evaluation and management by telemedicine and the availability of in person appointments. The patient expressed understanding and agreed to proceed.  History of Present Illness: Victor Ball is a 79 y.o. mal who returns for a 5 month follow up of elevated PSA and BPH without LUTS.   PSA in 2010 was 6 w/ negative prostate bx. Prostate bx in 2003 was also negative. He reports of bringing in all these records at next visit.  Hehad2 negativeprostate biopsies in the past with his first prostate bx being 19-20 years ago and second one performed in [place] 10 years ago. He reports PSA of 5 during prostate biopsy however he is not completely sure.   He had 3 close friends with prostate cancer so he requested a PSA test. PSA 9.7elevated from previous PSA of 6.29 in 2012.   PSA was 9.3 as of 09/25/2019.  MRI of prostate on 05/11/2019 showed no sign of high risk lesion. BPH with prostatomegaly also noted.   Patient underwent duodenal adenoma resection, duodenal bulb nodule for EUS and possible resection with Dr. Rush Landmark on 09/10/2019. Pathology revealed poylpoid duodenal mucosa with no specific histopathologic changes. Polypoid duodenal mucosa with Brunner gland adenoma/hamartoma. Negative for dysplasia and malignancy.   Denies urgency, frequency, dysuria and hematuria. No abdominal or flank pain.   Former smoker. He quit smoking 46 years ago.    Observations/Objective: Patient is engaged and asking good questions.   Assessment and Plan:  1. Elevated PSA PSA is stable at 9.3 on 09/25/2019. Patient will have his PCP recheck PSA in 6 months.  MRI of prostate benign, noted BPH w/ prostatomegaly.    2. BPH without LUTS  As above.   Follow Up Instructions: RTC in 1 year   I discussed the assessment and treatment plan with the patient. The patient was provided an opportunity to ask questions and all were answered. The patient agreed with the plan and demonstrated an understanding of the instructions.   The patient was advised to call back or seek an in-person evaluation if the symptoms worsen or if the condition fails to improve as anticipated.  I provided 10 minutes of non-face-to-face time during this encounter.   Fransico Him, am acting as a scribe for Dr. Hollice Espy.  I have reviewed the above documentation for accuracy and completeness, and I agree with the above.   Hollice Espy, MD

## 2019-09-26 ENCOUNTER — Encounter: Payer: Self-pay | Admitting: Gastroenterology

## 2019-09-26 ENCOUNTER — Telehealth (INDEPENDENT_AMBULATORY_CARE_PROVIDER_SITE_OTHER): Payer: Medicare Other | Admitting: Urology

## 2019-09-26 ENCOUNTER — Ambulatory Visit: Payer: Medicare Other | Admitting: Gastroenterology

## 2019-09-26 VITALS — BP 143/77 | HR 58 | Temp 98.0°F | Ht 74.0 in | Wt 201.0 lb

## 2019-09-26 DIAGNOSIS — R972 Elevated prostate specific antigen [PSA]: Secondary | ICD-10-CM | POA: Diagnosis not present

## 2019-09-26 DIAGNOSIS — D132 Benign neoplasm of duodenum: Secondary | ICD-10-CM

## 2019-09-26 LAB — PSA: Prostate Specific Ag, Serum: 9.3 ng/mL — ABNORMAL HIGH (ref 0.0–4.0)

## 2019-09-26 NOTE — Progress Notes (Signed)
[name], MD [address]  [place]  [place], [place] [address]  Main: [phone]  Fax: [phone]   Primary Care Physician: Tonia Ghent, MD   Chief Complaint  Patient presents with  . Follow-up    Duodenal Adenoma    HPI: Victor Ball is a 79 y.o. male with previous history of dysphagia, duodenal adenoma, here for follow-up.  Since his last visit he has undergone EGD/EUS with Dr. Rush Landmark was the resection of 2 duodenal nodules, both showing benign findings.  Dr. Donneta Romberg recommendation is to repeat EGD in 1 year.  Please see my previous notes for details of his previous duodenal adenoma and symptoms.  Patient is asymptomatic.  Denies any dysphagia.  Reports good appetite.  No weight loss.  No nausea or vomiting.  No blood in stool.  Is taking omeprazole as prescribed by Dr. Rush Landmark after his last procedure with him.  He questions if he needs to take the omeprazole for the entire 3 months that it was prescribed.  Current Outpatient Medications  Medication Sig Dispense Refill  . Ascorbic Acid (VITAMIN C) 1000 MG tablet Take 1,000 mg by mouth daily.    Marland Kitchen atorvastatin (LIPITOR) 10 MG tablet Take 1 tablet (10 mg total) by mouth daily. 90 tablet 3  . lisinopril (ZESTRIL) 5 MG tablet Take 1 tablet (5 mg total) by mouth daily. 90 tablet 3  . omeprazole (PRILOSEC) 40 MG capsule Take 1 capsule (40 mg total) by mouth 2 (two) times daily before a meal. 60 capsule 3   No current facility-administered medications for this visit.    Allergies as of 09/26/2019  . (No Known Allergies)    ROS:  General: Negative for anorexia, weight loss, fever, chills, fatigue, weakness. ENT: Negative for hoarseness, difficulty swallowing , nasal congestion. CV: Negative for chest pain, angina, palpitations, dyspnea on exertion, peripheral edema.  Respiratory: Negative for dyspnea at rest, dyspnea on exertion, cough, sputum, wheezing.  GI: See history of present  illness. GU:  Negative for dysuria, hematuria, urinary incontinence, urinary frequency, nocturnal urination.  Endo: Negative for unusual weight change.    Physical Examination:   BP (!) 143/77 (BP Location: Left Arm, Patient Position: Sitting, Cuff Size: Normal)   Pulse (!) 58   Temp 98 F (36.7 C) (Oral)   Ht 6\' 2"  (1.88 m)   Wt 201 lb (91.2 kg)   BMI 25.81 kg/m   General: Well-nourished, well-developed in no acute distress.  Eyes: No icterus. Conjunctivae pink. Mouth: Oropharyngeal mucosa moist and pink , no lesions erythema or exudate. Neck: Supple, Trachea midline Abdomen: Bowel sounds are normal, nontender, nondistended, no hepatosplenomegaly or masses, no abdominal bruits or hernia , no rebound or guarding.   Extremities: No lower extremity edema. No clubbing or deformities. Neuro: Alert and oriented x 3.  Grossly intact. Skin: Warm and dry, no jaundice.   Psych: Alert and cooperative, normal mood and affect.   Labs: CMP     Component Value Date/Time   NA 139 07/13/2019 1048   K 5.0 07/13/2019 1048   CL 103 07/13/2019 1048   CO2 28 07/13/2019 1048   GLUCOSE 94 07/13/2019 1048   BUN 19 07/13/2019 1048   CREATININE 0.79 07/13/2019 1048   CALCIUM 9.5 07/13/2019 1048   PROT 6.4 02/26/2019 0820   PROT 6.8 10/07/2017 0722   ALBUMIN 4.1 02/26/2019 0820   ALBUMIN 4.3 10/07/2017 0722   AST 13 02/26/2019 0820   ALT 12 02/26/2019 0820  ALKPHOS 72 02/26/2019 0820   BILITOT 0.4 02/26/2019 0820   BILITOT 0.5 10/07/2017 0722   GFRNONAA >60 09/14/2017 0401   GFRAA >60 09/14/2017 0401   Lab Results  Component Value Date   WBC 5.7 07/13/2019   HGB 16.0 07/13/2019   HCT 46.9 07/13/2019   MCV 93.7 07/13/2019   PLT 145.0 (L) 07/13/2019    Imaging Studies: No results found.  Assessment and Plan:   HASSAN BLACKSHIRE is a 79 y.o. y/o male with history of duodenal adenoma and dysphagia here for follow-up  Dysphagia has resolved  Patient denies any heartburn or  indigestion  Patient was prescribed omeprazole after his last EGD EUS with Dr. Rush Landmark.  His recommendations were omeprazole 40 mg twice daily for 1 month, then 40 mg once daily for 1 month, then 20 mg, and then stop.  Due to patient's concern of taking this medication for 3 months, I think it would be reasonable to stop the medication after 2 months.  That is, 40 mg twice daily for total of 1 month like he is taking, and then 40 mg once daily for 1 more month and then completely stop.  I discussed this with the patient and he would like me to confirm with Dr. Rush Landmark that he is okay with this.  I have sent over a message to Dr. Rush Landmark about the same and once he confirms, we will also let the patient know.  Follow-up with Dr. Rush Landmark in 1 year for repeat EGD    Dr [name]

## 2019-09-26 NOTE — Progress Notes (Signed)
This service is provided via telemedicine   No vital signs collected/recorded due to the encounter was a telemedicine visit.     Patient consents to a telephone visit:  yes    Names of all persons participating in the telemedicine service and their role in the encounter:  Fonnie Jarvis, CMA Hollice Espy, MD

## 2019-10-02 ENCOUNTER — Telehealth: Payer: Self-pay | Admitting: Urology

## 2019-10-12 ENCOUNTER — Other Ambulatory Visit: Payer: Self-pay

## 2019-10-17 ENCOUNTER — Other Ambulatory Visit: Payer: Self-pay

## 2019-10-17 ENCOUNTER — Ambulatory Visit: Payer: Medicare Other | Admitting: Dermatology

## 2019-10-17 ENCOUNTER — Encounter: Payer: Self-pay | Admitting: Dermatology

## 2019-10-17 DIAGNOSIS — Z1283 Encounter for screening for malignant neoplasm of skin: Secondary | ICD-10-CM | POA: Diagnosis not present

## 2019-10-17 DIAGNOSIS — L820 Inflamed seborrheic keratosis: Secondary | ICD-10-CM | POA: Diagnosis not present

## 2019-10-17 DIAGNOSIS — L89309 Pressure ulcer of unspecified buttock, unspecified stage: Secondary | ICD-10-CM | POA: Diagnosis not present

## 2019-10-17 DIAGNOSIS — D229 Melanocytic nevi, unspecified: Secondary | ICD-10-CM

## 2019-10-17 DIAGNOSIS — L570 Actinic keratosis: Secondary | ICD-10-CM | POA: Diagnosis not present

## 2019-10-17 DIAGNOSIS — D1800 Hemangioma unspecified site: Secondary | ICD-10-CM

## 2019-10-17 DIAGNOSIS — L814 Other melanin hyperpigmentation: Secondary | ICD-10-CM

## 2019-10-17 DIAGNOSIS — L821 Other seborrheic keratosis: Secondary | ICD-10-CM

## 2019-10-17 DIAGNOSIS — L578 Other skin changes due to chronic exposure to nonionizing radiation: Secondary | ICD-10-CM

## 2019-10-17 NOTE — Patient Instructions (Addendum)
Wound Care Instructions  1. Cleanse wound gently with soap and water once a day then pat dry with clean gauze. Apply a thing coat of Petrolatum (petroleum jelly, "Vaseline") over the wound (unless you have an allergy to this). We recommend that you use a new, sterile tube of Vaseline. Do not pick or remove scabs. Do not remove the yellow or white "healing tissue" from the base of the wound.  2. Cover the wound with fresh, clean, nonstick gauze and secure with paper tape. You may use Band-Aids in place of gauze and tape if the would is small enough, but would recommend trimming much of the tape off as there is often too much. Sometimes Band-Aids can irritate the skin.  3. You should call the office for your biopsy report after 1 week if you have not already been contacted.  4. If you experience any problems, such as abnormal amounts of bleeding, swelling, significant bruising, significant pain, or evidence of infection, please call the office immediately.  5. FOR ADULT SURGERY PATIENTS: If you need something for pain relief you may take 1 extra strength Tylenol (acetaminophen) AND 2 Ibuprofen (200mg each) together every 4 hours as needed for pain. (do not take these if you are allergic to them or if you have a reason you should not take them.) Typically, you may only need pain medication for 1 to 3 days.   Instructions for [facility] Medications  One or more of your medications was sent to the [facility] mail order compounding pharmacy. You will receive an email from them and can purchase the medicine through that link. It will then be mailed to your home at the address you confirmed. If for any reason you do not receive an email from them, please check your spam folder. If you still do not find the email, please let us know. [facility] phone number is [phone].   

## 2019-10-17 NOTE — Progress Notes (Signed)
Follow-Up Visit   Subjective  Victor Ball is a 79 y.o. male who presents for the following: Annual Exam (TBSE today) and Other. The patient presents for Total-Body Skin Exam (TBSE) for skin cancer screening and mole check. He has an irritating lesion on his arm.  He would like this treated. He has some rough areas on his face.  He has some sores on his buttocks.  The following portions of the chart were reviewed this encounter and updated as appropriate:  Tobacco  Allergies  Meds  Problems  Med Hx  Surg Hx  Fam Hx     Review of Systems:  No other skin or systemic complaints except as noted in HPI or Assessment and Plan.  Objective  Well appearing patient in no apparent distress; mood and affect are within normal limits.  A full examination was performed including scalp, head, eyes, ears, nose, lips, neck, chest, axillae, abdomen, back, buttocks, bilateral upper extremities, bilateral lower extremities, hands, feet, fingers, toes, fingernails, and toenails. All findings within normal limits unless otherwise noted below.  Objective  Right tricep: Erythematous keratotic or waxy stuck-on papule or plaque.   Objective  Right face: Erythematous thin papules/macules with gritty scale.   Objective  Perianal area: Decubitus ulcer x 2 of bilateral perianal area   Assessment & Plan    Lentigines - Scattered tan macules - Discussed due to sun exposure - Benign, observe - Call for any changes  Seborrheic Keratoses - Stuck-on, waxy, tan-brown papules and plaques  - Discussed benign etiology and prognosis. - Observe - Call for any changes  Melanocytic Nevi - Tan-brown and/or pink-flesh-colored symmetric macules and papules - Benign appearing on exam today - Observation - Call clinic for new or changing moles - Recommend daily use of broad spectrum spf 30+ sunscreen to sun-exposed areas.   Hemangiomas - Red papules - Discussed benign nature - Observe - Call for any  changes  Actinic Damage - diffuse scaly erythematous macules with underlying dyspigmentation - Recommend daily broad spectrum sunscreen SPF 30+ to sun-exposed areas, reapply every 2 hours as needed.  - Call for new or changing lesions.  Skin cancer screening performed today.  Inflamed seborrheic keratosis Right tricep  Destruction of lesion - Right tricep Complexity: simple   Destruction method: cryotherapy   Informed consent: discussed and consent obtained   Timeout:  patient name, date of birth, surgical site, and procedure verified Lesion destroyed using liquid nitrogen: Yes   Region frozen until ice ball extended beyond lesion: Yes   Outcome: patient tolerated procedure well with no complications   Post-procedure details: wound care instructions given    AK (actinic keratosis) Right face  Destruction of lesion - Right face Complexity: simple   Destruction method: cryotherapy   Informed consent: discussed and consent obtained   Timeout:  patient name, date of birth, surgical site, and procedure verified Lesion destroyed using liquid nitrogen: Yes   Region frozen until ice ball extended beyond lesion: Yes   Outcome: patient tolerated procedure well with no complications   Post-procedure details: wound care instructions given    Pressure injury of skin of buttock, perianal area x2 -decubitus ulcers -symptomatic Perianal area Discussed this can be a chronic condition and may wax and wane.  We can probably improve it but may not be able to cure him.  Iodoquinol1%/Hydrocortisone 2.5%/Niacinamide 2% cream bid prn 30g 4RF sent to [facility].  Return in about 3 months (around 01/16/2020) for AK/ISK follow up.  I,  Ashok Cordia, CMA, am acting as scribe for Sarina Ser, MD .  Documentation: I have reviewed the above documentation for accuracy and completeness, and I agree with the above.  Sarina Ser, MD

## 2019-10-30 ENCOUNTER — Telehealth: Payer: Self-pay | Admitting: Urology

## 2019-10-31 ENCOUNTER — Telehealth: Payer: Self-pay | Admitting: Urology

## 2020-02-18 ENCOUNTER — Encounter: Payer: Medicare Other | Admitting: Dermatology

## 2020-02-20 ENCOUNTER — Other Ambulatory Visit: Payer: Self-pay | Admitting: Family Medicine

## 2020-02-20 DIAGNOSIS — Z125 Encounter for screening for malignant neoplasm of prostate: Secondary | ICD-10-CM

## 2020-02-20 DIAGNOSIS — R972 Elevated prostate specific antigen [PSA]: Secondary | ICD-10-CM

## 2020-02-20 DIAGNOSIS — I10 Essential (primary) hypertension: Secondary | ICD-10-CM

## 2020-02-21 DIAGNOSIS — H524 Presbyopia: Secondary | ICD-10-CM | POA: Diagnosis not present

## 2020-02-21 DIAGNOSIS — H5203 Hypermetropia, bilateral: Secondary | ICD-10-CM | POA: Diagnosis not present

## 2020-02-21 DIAGNOSIS — H52223 Regular astigmatism, bilateral: Secondary | ICD-10-CM | POA: Diagnosis not present

## 2020-02-21 DIAGNOSIS — H2513 Age-related nuclear cataract, bilateral: Secondary | ICD-10-CM | POA: Diagnosis not present

## 2020-02-29 ENCOUNTER — Other Ambulatory Visit: Payer: Self-pay

## 2020-02-29 ENCOUNTER — Ambulatory Visit (INDEPENDENT_AMBULATORY_CARE_PROVIDER_SITE_OTHER): Payer: Medicare Other

## 2020-02-29 DIAGNOSIS — Z0000 Encounter for general adult medical examination without abnormal findings: Secondary | ICD-10-CM | POA: Diagnosis not present

## 2020-02-29 NOTE — Patient Instructions (Signed)
[name] , Thank you for taking time to come for your Medicare Wellness Visit. I appreciate your ongoing commitment to your health goals. Please review the following plan we discussed and let me know if I can assist you in the future.   Screening recommendations/referrals: Colonoscopy: no longer required  Recommended yearly ophthalmology/optometry visit for glaucoma screening and checkup Recommended yearly dental visit for hygiene and checkup  Vaccinations: Influenza vaccine: Up to date, completed 10/24/2019, due 08/2020 Pneumococcal vaccine: Completed series Tdap vaccine: Up to date, completed 10/24/2012, due 10/2022 Shingles vaccine: Completed series Covid-19: Completed series  Advanced directives: copy in chart per patient   Conditions/risks identified: hypertension  Next appointment: Follow up in one year for your annual wellness visit.   Preventive Care 6 Years and Older, Male Preventive care refers to lifestyle choices and visits with your health care provider that can promote health and wellness. What does preventive care include?  A yearly physical exam. This is also called an annual well check.  Dental exams once or twice a year.  Routine eye exams. Ask your health care provider how often you should have your eyes checked.  Personal lifestyle choices, including:  Daily care of your teeth and gums.  Regular physical activity.  Eating a healthy diet.  Avoiding tobacco and drug use.  Limiting alcohol use.  Practicing safe sex.  Taking low doses of aspirin every day.  Taking vitamin and mineral supplements as recommended by your health care provider. What happens during an annual well check? The services and screenings done by your health care provider during your annual well check will depend on your age, overall health, lifestyle risk factors, and family history of disease. Counseling  Your health care provider may ask you questions about your:  Alcohol  use.  Tobacco use.  Drug use.  Emotional well-being.  Home and relationship well-being.  Sexual activity.  Eating habits.  History of falls.  Memory and ability to understand (cognition).  Work and work Statistician. Screening  You may have the following tests or measurements:  Height, weight, and BMI.  Blood pressure.  Lipid and cholesterol levels. These may be checked every 5 years, or more frequently if you are over 85 years old.  Skin check.  Lung cancer screening. You may have this screening every year starting at age 74 if you have a 30-pack-year history of smoking and currently smoke or have quit within the past 15 years.  Fecal occult blood test (FOBT) of the stool. You may have this test every year starting at age 4.  Flexible sigmoidoscopy or colonoscopy. You may have a sigmoidoscopy every 5 years or a colonoscopy every 10 years starting at age 82.  Prostate cancer screening. Recommendations will vary depending on your family history and other risks.  Hepatitis C blood test.  Hepatitis B blood test.  Sexually transmitted disease (STD) testing.  Diabetes screening. This is done by checking your blood sugar (glucose) after you have not eaten for a while (fasting). You may have this done every 1-3 years.  Abdominal aortic aneurysm (AAA) screening. You may need this if you are a current or former smoker.  Osteoporosis. You may be screened starting at age 38 if you are at high risk. Talk with your health care provider about your test results, treatment options, and if necessary, the need for more tests. Vaccines  Your health care provider may recommend certain vaccines, such as:  Influenza vaccine. This is recommended every year.  Tetanus, diphtheria,  and acellular pertussis (Tdap, Td) vaccine. You may need a Td booster every 10 years.  Zoster vaccine. You may need this after age 34.  Pneumococcal 13-valent conjugate (PCV13) vaccine. One dose is  recommended after age 61.  Pneumococcal polysaccharide (PPSV23) vaccine. One dose is recommended after age 37. Talk to your health care provider about which screenings and vaccines you need and how often you need them. This information is not intended to replace advice given to you by your health care provider. Make sure you discuss any questions you have with your health care provider. Document Released: 01/31/2015 Document Revised: 09/24/2015 Document Reviewed: 11/05/2014 Elsevier Interactive Patient Education  2017 [place] Prevention in the Home Falls can cause injuries. They can happen to people of all ages. There are many things you can do to make your home safe and to help prevent falls. What can I do on the outside of my home?  Regularly fix the edges of walkways and driveways and fix any cracks.  Remove anything that might make you trip as you walk through a door, such as a raised step or threshold.  Trim any bushes or trees on the path to your home.  Use bright outdoor lighting.  Clear any walking paths of anything that might make someone trip, such as rocks or tools.  Regularly check to see if handrails are loose or broken. Make sure that both sides of any steps have handrails.  Any raised decks and porches should have guardrails on the edges.  Have any leaves, snow, or ice cleared regularly.  Use sand or salt on walking paths during winter.  Clean up any spills in your garage right away. This includes oil or grease spills. What can I do in the bathroom?  Use night lights.  Install grab bars by the toilet and in the tub and shower. Do not use towel bars as grab bars.  Use non-skid mats or decals in the tub or shower.  If you need to sit down in the shower, use a plastic, non-slip stool.  Keep the floor dry. Clean up any water that spills on the floor as soon as it happens.  Remove soap buildup in the tub or shower regularly.  Attach bath mats  securely with double-sided non-slip rug tape.  Do not have throw rugs and other things on the floor that can make you trip. What can I do in the bedroom?  Use night lights.  Make sure that you have a light by your bed that is easy to reach.  Do not use any sheets or blankets that are too big for your bed. They should not hang down onto the floor.  Have a firm chair that has side arms. You can use this for support while you get dressed.  Do not have throw rugs and other things on the floor that can make you trip. What can I do in the kitchen?  Clean up any spills right away.  Avoid walking on wet floors.  Keep items that you use a lot in easy-to-reach places.  If you need to reach something above you, use a strong step stool that has a grab bar.  Keep electrical cords out of the way.  Do not use floor polish or wax that makes floors slippery. If you must use wax, use non-skid floor wax.  Do not have throw rugs and other things on the floor that can make you trip. What can I do with my  stairs?  Do not leave any items on the stairs.  Make sure that there are handrails on both sides of the stairs and use them. Fix handrails that are broken or loose. Make sure that handrails are as long as the stairways.  Check any carpeting to make sure that it is firmly attached to the stairs. Fix any carpet that is loose or worn.  Avoid having throw rugs at the top or bottom of the stairs. If you do have throw rugs, attach them to the floor with carpet tape.  Make sure that you have a light switch at the top of the stairs and the bottom of the stairs. If you do not have them, ask someone to add them for you. What else can I do to help prevent falls?  Wear shoes that:  Do not have high heels.  Have rubber bottoms.  Are comfortable and fit you well.  Are closed at the toe. Do not wear sandals.  If you use a stepladder:  Make sure that it is fully opened. Do not climb a closed  stepladder.  Make sure that both sides of the stepladder are locked into place.  Ask someone to hold it for you, if possible.  Clearly mark and make sure that you can see:  Any grab bars or handrails.  First and last steps.  Where the edge of each step is.  Use tools that help you move around (mobility aids) if they are needed. These include:  Canes.  Walkers.  Scooters.  Crutches.  Turn on the lights when you go into a dark area. Replace any light bulbs as soon as they burn out.  Set up your furniture so you have a clear path. Avoid moving your furniture around.  If any of your floors are uneven, fix them.  If there are any pets around you, be aware of where they are.  Review your medicines with your doctor. Some medicines can make you feel dizzy. This can increase your chance of falling. Ask your doctor what other things that you can do to help prevent falls. This information is not intended to replace advice given to you by your health care provider. Make sure you discuss any questions you have with your health care provider. Document Released: 10/31/2008 Document Revised: 06/12/2015 Document Reviewed: 02/08/2014 Elsevier Interactive Patient Education  2017 [employer].

## 2020-02-29 NOTE — Progress Notes (Signed)
PCP notes:  Health Maintenance: No gaps noted   Abnormal Screenings: none   Patient concerns: Wants PSA level checked Wants to be labs checked to make sure recent viral infection has cleared  Nurse concerns: none   Next PCP appt: 03/10/2020 @ 12 pm

## 2020-02-29 NOTE — Progress Notes (Signed)
Subjective:   Victor Ball is a 80 y.o. male who presents for Medicare Annual/Subsequent preventive examination.  Review of Systems: N/A     I connected with the patient today by telephone and verified that I am speaking with the correct person using two identifiers. Location patient: home Location nurse: work Persons participating in the telephone visit: patient, nurse.   I discussed the limitations, risks, security and privacy concerns of performing an evaluation and management service by telephone and the availability of in person appointments. I also discussed with the patient that there may be a patient responsible charge related to this service. The patient expressed understanding and verbally consented to this telephonic visit.        Cardiac Risk Factors include: advanced age (>61men, >78 women);male gender;hypertension     Objective:    Today's Vitals   There is no height or weight on file to calculate BMI.  Advanced Directives 02/29/2020 09/10/2019 06/04/2019 02/27/2019 02/20/2018 09/14/2017 09/02/2017  Does Patient Have a Medical Advance Directive? Yes Yes Yes Yes Yes No No  Type of Paramedic of [place];Living will [place];Living will Living will;Healthcare Power of [place];Living will [place];Living will - -  Does patient want to make changes to medical advance directive? - - - - - - -  Copy of Victoria Vera in Chart? No - copy requested Yes - validated most recent copy scanned in chart (See row information) - Yes - validated most recent copy scanned in chart (See row information) Yes - validated most recent copy scanned in chart (See row information) - -  Would patient like information on creating a medical advance directive? - - - - - No - Patient declined No - Patient declined    Current Medications (verified) Outpatient Encounter Medications as of 02/29/2020   Medication Sig  . Ascorbic Acid (VITAMIN C) 1000 MG tablet Take 1,000 mg by mouth daily.  Marland Kitchen atorvastatin (LIPITOR) 10 MG tablet Take 1 tablet (10 mg total) by mouth daily.  Marland Kitchen lisinopril (ZESTRIL) 5 MG tablet Take 1 tablet (5 mg total) by mouth daily.  Marland Kitchen omeprazole (PRILOSEC) 40 MG capsule Take 1 capsule (40 mg total) by mouth 2 (two) times daily before a meal.   No facility-administered encounter medications on file as of 02/29/2020.    Allergies (verified) Patient has no known allergies.   History: Past Medical History:  Diagnosis Date  . Duodenal adenoma    duodenal nodule ( copied from order for consent)  . Hepatitis C, chronic ([place])    treated prev with Harvoni, tested and cleared  . History of kidney stones   . Hyperlipidemia   . Hypertension   . Wears glasses    Past Surgical History:  Procedure Laterality Date  . APPENDECTOMY  1948  . ENDOSCOPIC MUCOSAL RESECTION N/A 09/10/2019   Procedure: ENDOSCOPIC MUCOSAL RESECTION;  Surgeon: Rush Landmark Telford Nab., MD;  Location: [facility];  Service: Gastroenterology;  Laterality: N/A;  . ESOPHAGEAL DILATION  09/10/2019   Procedure: ESOPHAGEAL DILATION;  Surgeon: Rush Landmark Telford Nab., MD;  Location: [place];  Service: Gastroenterology;;  . ESOPHAGOGASTRODUODENOSCOPY (EGD) WITH PROPOFOL N/A 06/04/2019   Procedure: ESOPHAGOGASTRODUODENOSCOPY (EGD) WITH PROPOFOL;  Surgeon: Virgel Manifold, MD;  Location: [facility];  Service: Endoscopy;  Laterality: N/A;  . ESOPHAGOGASTRODUODENOSCOPY (EGD) WITH PROPOFOL N/A 09/10/2019   Procedure: ESOPHAGOGASTRODUODENOSCOPY (EGD) WITH PROPOFOL;  Surgeon: Rush Landmark Telford Nab., MD;  Location: [place];  Service: Gastroenterology;  Laterality: N/A;  . HEMOSTASIS CLIP PLACEMENT  09/10/2019   Procedure: HEMOSTASIS CLIP PLACEMENT;  Surgeon: Irving Copas., MD;  Location: [place] [place];  Service: Gastroenterology;;  . JOINT REPLACEMENT     bilateral hip replacement  . LIVER  BIOPSY    . POLYPECTOMY  09/10/2019   Procedure: POLYPECTOMY;  Surgeon: Mansouraty, Telford Nab., MD;  Location: [place];  Service: Gastroenterology;;  . PROSTATE BIOPSY     neg, x2  . SUBMUCOSAL LIFTING INJECTION  09/10/2019   Procedure: SUBMUCOSAL LIFTING INJECTION;  Surgeon: Rush Landmark Telford Nab., MD;  Location: [facility];  Service: Gastroenterology;;  . Lia Foyer TATTOO INJECTION  09/10/2019   Procedure: SUBMUCOSAL TATTOO INJECTION;  Surgeon: Irving Copas., MD;  Location: [place];  Service: Gastroenterology;;  . teeth extractions    . TOE FUSION  1985   left great toe  . TONSILLECTOMY AND ADENOIDECTOMY  1945  . TOTAL HIP ARTHROPLASTY Left 11/09/2016   Procedure: LEFT TOTAL HIP ARTHROPLASTY ANTERIOR APPROACH;  Surgeon: Mcarthur Rossetti, MD;  Location: [place];  Service: Orthopedics;  Laterality: Left;  . TOTAL HIP ARTHROPLASTY Right 09/13/2017   Procedure: RIGHT TOTAL HIP ARTHROPLASTY ANTERIOR APPROACH;  Surgeon: Mcarthur Rossetti, MD;  Location: [place];  Service: Orthopedics;  Laterality: Right;  . UPPER ESOPHAGEAL ENDOSCOPIC ULTRASOUND (EUS) N/A 09/10/2019   Procedure: UPPER ESOPHAGEAL ENDOSCOPIC ULTRASOUND (EUS);  Surgeon: Irving Copas., MD;  Location: [place];  Service: Gastroenterology;  Laterality: N/A;  . VASECTOMY     Family History  Problem Relation Age of Onset  . Heart disease Father   . Diabetes Father   . Heart attack Father   . Celiac disease Sister   . Heart disease Brother        MI then stents  . Heart attack Brother   . Stroke Brother   . Cancer Paternal Aunt   . Prostate cancer Neg Hx   . Colon cancer Neg Hx   . Esophageal cancer Neg Hx   . Inflammatory bowel disease Neg Hx   . Liver disease Neg Hx   . Pancreatic cancer Neg Hx   . Rectal cancer Neg Hx   . Stomach cancer Neg Hx    Social History   Socioeconomic History  . Marital status: Married    Spouse name: Not on file  . Number of children: 2  .  Years of education: Not on file  . Highest education level: Not on file  Occupational History  . Occupation: Insurance agent---retired    Comment: [employer] and worker's comp  Tobacco Use  . Smoking status: Former Smoker    Types: Cigarettes    Quit date: 01/18/1973    Years since quitting: 47.1  . Smokeless tobacco: Never Used  Vaping Use  . Vaping Use: Never used  Substance and Sexual Activity  . Alcohol use: Yes    Alcohol/week: 7.0 standard drinks    Types: 4 Glasses of wine, 3 Standard drinks or equivalent per week    Comment: 1 drink x 6 days - either wine or cocktail  . Drug use: No  . Sexual activity: Yes  Other Topics Concern  . Not on file  Social History Narrative   2 daughters   Married 1961   Partially retired from Insurance underwriter, still consulting some as of 2020      Has living will   Wife, then daughters equally if wife were incapacitated, has health care POA.   Would accept resuscitation  attempts   Not sure about tube feeds   Social Determinants of Health   Financial Resource Strain: Low Risk   . Difficulty of Paying Living Expenses: Not hard at all  Food Insecurity: No Food Insecurity  . Worried About Charity fundraiser in the Last Year: Never true  . Ran Out of Food in the Last Year: Never true  Transportation Needs: No Transportation Needs  . Lack of Transportation (Medical): No  . Lack of Transportation (Non-Medical): No  Physical Activity: Sufficiently Active  . Days of Exercise per Week: 5 days  . Minutes of Exercise per Session: 150+ min  Stress: No Stress Concern Present  . Feeling of Stress : Not at all  Social Connections: Not on file    Tobacco Counseling Counseling given: Not Answered   Clinical Intake:  Pre-visit preparation completed: Yes  Pain : No/denies pain     Nutritional Risks: None Diabetes: No  How often do you need to have someone help you when you read instructions, pamphlets, or other written materials from  your doctor or pharmacy?: 1 - Never  Diabetic: No Nutrition Risk Assessment:  Has the patient had any N/V/D within the last 2 months?  No  Does the patient have any non-healing wounds?  No  Has the patient had any unintentional weight loss or weight gain?  No   Diabetes:  Is the patient diabetic?  No  If diabetic, was a CBG obtained today?  N/A Did the patient bring in their glucometer from home?  N/A How often do you monitor your CBG's? N/A.   Financial Strains and Diabetes Management:  Are you having any financial strains with the device, your supplies or your medication? N/A.  Does the patient want to be seen by Chronic Care Management for management of their diabetes?  N/A Would the patient like to be referred to a Nutritionist or for Diabetic Management?  N/A   Interpreter Needed?: No  Information entered by :: CJohnson, LPN   Activities of Daily Living In your present state of health, do you have any difficulty performing the following activities: 02/29/2020  Hearing? N  Vision? N  Difficulty concentrating or making decisions? N  Walking or climbing stairs? N  Dressing or bathing? N  Doing errands, shopping? N  Preparing Food and eating ? N  Using the Toilet? N  In the past six months, have you accidently leaked urine? N  Do you have problems with loss of bowel control? N  Managing your Medications? N  Managing your Finances? N  Housekeeping or managing your Housekeeping? N  Some recent data might be hidden    Patient Care Team: Tonia Ghent, MD as PCP - General (Family Medicine) Mcarthur Rossetti, MD as Consulting Physician (Orthopedic Surgery) Eugenie Birks Paschal Dopp., MD as Referring Physician (Dentistry) Ralene Bathe, MD as Referring Physician (Dermatology)  Indicate any recent Medical Services you may have received from other than [facility] providers in the past year (date may be approximate).     Assessment:   This is a routine wellness  examination for Victor Ball.  Hearing/Vision screen  Hearing Screening   125Hz  250Hz  500Hz  1000Hz  2000Hz  3000Hz  4000Hz  6000Hz  8000Hz   Right ear:           Left ear:           Vision Screening Comments: Patient gets annual eye exams   Dietary issues and exercise activities discussed: Current Exercise Habits: Home exercise routine, Type of exercise:  Other - see comments (golf and tennis), Time (Minutes): > 60, Frequency (Times/Week): 5, Weekly Exercise (Minutes/Week): 0, Intensity: Moderate, Exercise limited by: None identified  Goals    . Increase physical activity     Starting 02/20/2018, I will continue to do Silver Sneakers for 45 minutes 3 days per week, to do cardio for 30 minutes 3 days per week, to play tennis for 2 hours weekly, and to play golf and to hike as weather permits.     . Patient Stated     02/27/2019, I will continue exercising at the hospital gym 3 times a week for at least 60 minutes.    . Patient Stated     02/29/2020, I will continue to play golf and tennis 4 days a week between 2-4 hours.       Depression Screen PHQ 2/9 Scores 02/29/2020 02/27/2019 02/20/2018 02/18/2017 02/17/2016 02/17/2015 01/04/2014  PHQ - 2 Score 0 0 0 0 0 0 0  PHQ- 9 Score 0 0 0 0 - - -    Fall Risk Fall Risk  02/29/2020 02/27/2019 02/20/2018 02/18/2017 02/17/2016  Falls in the past year? 0 0 0 No No  Number falls in past yr: 0 0 - - -  Injury with Fall? 0 0 - - -  Risk for fall due to : No Fall Risks No Fall Risks - - -  Follow up Falls evaluation completed;Falls prevention discussed Falls evaluation completed;Falls prevention discussed - - -    FALL RISK PREVENTION PERTAINING TO THE HOME:  Any stairs in or around the home? Yes  If so, are there any without handrails? No  Home free of loose throw rugs in walkways, pet beds, electrical cords, etc? Yes  Adequate lighting in your home to reduce risk of falls? Yes   ASSISTIVE DEVICES UTILIZED TO PREVENT FALLS:  Life alert? No  Use of a cane, walker or  w/c? No  Grab bars in the bathroom? No  Shower chair or bench in shower? No  Elevated toilet seat or a handicapped toilet? No   TIMED UP AND GO:  Was the test performed? N/A telephone visit.   Cognitive Function: MMSE - Mini Mental State Exam 02/29/2020 02/27/2019 02/20/2018 02/18/2017 02/17/2016  Orientation to time 5 5 5 5 5   Orientation to Place 5 5 5 5 5   Registration 3 3 3 3 3   Attention/ Calculation 5 5 0 0 0  Recall 3 3 3 3 3   Language- name 2 objects - - 0 0 0  Language- repeat 1 1 1 1 1   Language- follow 3 step command - - 3 3 3   Language- read & follow direction - - 0 0 0  Write a sentence - - 0 0 0  Copy design - - 0 0 0  Total score - - 20 20 20   Mini Cog  Mini-Cog screen was completed. Maximum score is 22. A value of 0 denotes this part of the MMSE was not completed or the patient failed this part of the Mini-Cog screening.       Immunizations Immunization History  Administered Date(s) Administered  . Fluad Quad(high Dose 65+) 09/15/2018  . Influenza Split 10/06/2011  . Influenza,inj,Quad PF,6+ Mos 10/04/2012, 12/28/2013, 12/11/2014, 12/10/2015, 10/11/2016, 10/27/2017  . Influenza-Unspecified 11/04/2008, 12/02/2010, 10/24/2019  . PFIZER(Purple Top)SARS-COV-2 Vaccination 01/29/2019, 02/19/2019, 10/24/2019  . Pneumococcal Conjugate-13 08/15/2014  . Pneumococcal Polysaccharide-23 01/19/2003, 07/15/2003, 06/16/2012  . Td 07/15/2003, 10/24/2012  . Tdap 01/19/2003  . Zoster 01/19/2007, 10/25/2007  .  Zoster Recombinat (Shingrix) 08/03/2019, 11/25/2019    TDAP status: Up to date  Flu Vaccine status: Up to date  Pneumococcal vaccine status: Up to date  Covid-19 vaccine status: Completed vaccines  Qualifies for Shingles Vaccine? Yes   Zostavax completed Yes   Shingrix Completed?: Yes  Screening Tests Health Maintenance  Topic Date Due  . COVID-19 Vaccine (4 - Booster for Pfizer series) 04/23/2020  . TETANUS/TDAP  10/25/2022  . INFLUENZA VACCINE  Completed  .  PNA vac Low Risk Adult  Completed    Health Maintenance  There are no preventive care reminders to display for this patient.  Colorectal cancer screening: No longer required.   Lung Cancer Screening: (Low Dose CT Chest recommended if Age 76-80 years, 30 pack-year currently smoking OR have quit w/in 15years.) does not qualify.    Additional Screening:  Hepatitis C Screening: does not qualify; Completed N/A  Vision Screening: Recommended annual ophthalmology exams for early detection of glaucoma and other disorders of the eye. Is the patient up to date with their annual eye exam?  Yes  Who is the provider or what is the name of the office in which the patient attends annual eye exams? [facility], Dr. Dorcas Carrow If pt is not established with a provider, would they like to be referred to a provider to establish care? No .   Dental Screening: Recommended annual dental exams for proper oral hygiene  Community Resource Referral / Chronic Care Management: CRR required this visit?  No   CCM required this visit?  No      Plan:     I have personally reviewed and noted the following in the patient's chart:   . Medical and social history . Use of alcohol, tobacco or illicit drugs  . Current medications and supplements . Functional ability and status . Nutritional status . Physical activity . Advanced directives . List of other physicians . Hospitalizations, surgeries, and ER visits in previous 12 months . Vitals . Screenings to include cognitive, depression, and falls . Referrals and appointments  In addition, I have reviewed and discussed with patient certain preventive protocols, quality metrics, and best practice recommendations. A written personalized care plan for preventive services as well as general preventive health recommendations were provided to patient.   Due to this being a telephonic visit, the after visit summary with patients personalized plan was  offered to patient via office or my-chart. Patient preferred to pick up at office at next visit or via mychart.   Andrez Grime, LPN   [id number]

## 2020-03-05 ENCOUNTER — Ambulatory Visit: Payer: Medicare Other

## 2020-03-05 ENCOUNTER — Other Ambulatory Visit (INDEPENDENT_AMBULATORY_CARE_PROVIDER_SITE_OTHER): Payer: Medicare Other

## 2020-03-05 ENCOUNTER — Other Ambulatory Visit: Payer: Self-pay

## 2020-03-05 DIAGNOSIS — I10 Essential (primary) hypertension: Secondary | ICD-10-CM

## 2020-03-05 DIAGNOSIS — R972 Elevated prostate specific antigen [PSA]: Secondary | ICD-10-CM | POA: Diagnosis not present

## 2020-03-05 DIAGNOSIS — Z125 Encounter for screening for malignant neoplasm of prostate: Secondary | ICD-10-CM | POA: Diagnosis not present

## 2020-03-05 LAB — COMPREHENSIVE METABOLIC PANEL
ALT: 11 U/L (ref 0–53)
AST: 12 U/L (ref 0–37)
Albumin: 3.9 g/dL (ref 3.5–5.2)
Alkaline Phosphatase: 70 U/L (ref 39–117)
BUN: 18 mg/dL (ref 6–23)
CO2: 31 mEq/L (ref 19–32)
Calcium: 8.9 mg/dL (ref 8.4–10.5)
Chloride: 105 mEq/L (ref 96–112)
Creatinine, Ser: 0.83 mg/dL (ref 0.40–1.50)
GFR: 81.74 mL/min (ref >60.00)
Glucose, Bld: 106 mg/dL — ABNORMAL HIGH (ref 70–99)
Potassium: 4.4 mEq/L (ref 3.5–5.1)
Sodium: 141 mEq/L (ref 135–145)
Total Bilirubin: 0.5 mg/dL (ref 0.2–1.2)
Total Protein: 6.4 g/dL (ref 6.0–8.3)

## 2020-03-05 LAB — LIPID PANEL
Cholesterol: 114 mg/dL (ref 0–200)
HDL: 44.8 mg/dL (ref >39.00)
LDL Cholesterol: 56 mg/dL (ref 0–99)
NonHDL: 68.98
Total CHOL/HDL Ratio: 3
Triglycerides: 67 mg/dL (ref 0.0–149.0)
VLDL: 13.4 mg/dL (ref 0.0–40.0)

## 2020-03-05 LAB — PSA, MEDICARE: PSA: 8.56 ng/ml — ABNORMAL HIGH (ref 0.10–4.00)

## 2020-03-10 ENCOUNTER — Encounter: Payer: Medicare Other | Admitting: Family Medicine

## 2020-03-13 ENCOUNTER — Encounter: Payer: Self-pay | Admitting: Family Medicine

## 2020-03-13 ENCOUNTER — Other Ambulatory Visit: Payer: Self-pay

## 2020-03-13 ENCOUNTER — Ambulatory Visit (INDEPENDENT_AMBULATORY_CARE_PROVIDER_SITE_OTHER): Payer: Medicare Other | Admitting: Family Medicine

## 2020-03-13 VITALS — BP 118/74 | HR 58 | Temp 97.3°F | Ht 74.0 in | Wt 199.0 lb

## 2020-03-13 DIAGNOSIS — E782 Mixed hyperlipidemia: Secondary | ICD-10-CM

## 2020-03-13 DIAGNOSIS — Z0000 Encounter for general adult medical examination without abnormal findings: Secondary | ICD-10-CM

## 2020-03-13 DIAGNOSIS — R972 Elevated prostate specific antigen [PSA]: Secondary | ICD-10-CM | POA: Diagnosis not present

## 2020-03-13 DIAGNOSIS — I10 Essential (primary) hypertension: Secondary | ICD-10-CM

## 2020-03-13 DIAGNOSIS — Z7189 Other specified counseling: Secondary | ICD-10-CM

## 2020-03-13 MED ORDER — ATORVASTATIN CALCIUM 10 MG PO TABS
10.0000 mg | ORAL_TABLET | Freq: Every day | ORAL | 3 refills | Status: DC
Start: 2020-03-13 — End: 2021-05-05

## 2020-03-13 MED ORDER — LISINOPRIL 5 MG PO TABS: 5.0000 mg | ORAL_TABLET | Freq: Every day | ORAL | 3 refills | Status: DC

## 2020-03-13 NOTE — Patient Instructions (Signed)
If you don't get a call from GI this fall, then let me know.  Take care.  Glad to see you. Thanks for your effort.

## 2020-03-13 NOTE — Progress Notes (Signed)
This visit occurred during the SARS-CoV-2 public health emergency.  Safety protocols were in place, including screening questions prior to the visit, additional usage of staff PPE, and extensive cleaning of exam room while observing appropriate contact time as indicated for disinfecting solutions.  He had trouble swallowing big chalky pills, had EGD done.  He had plan for f/u later this year.  Swallowing food well.  Discussed.  He will update me as needed.  Living will d/w pt.  Wife designated, then daughters equally if wife were incapacitated, if patient were incapacitated.   Tetanus 2014 PNA up to date Flu 2021 Shingrix done.   [place], [place], 2021.   PSA d/w pt.  S/p neg bx prev.  PSA still elevated but improved.  I will update urology.  He has nocturia x1 with some occ urinary urgency.  He'll update me if sig enough to tx.   He'll check with [employer] about hearing aids.    He has intentional weight loss noted.  He cut out carbs and added more fiber.    Hypertension:    Using medication without problems or lightheadedness: yes Chest pain with exertion:no Edema:no Short of breath:no  Elevated Cholesterol: Using medications without problems:yes Muscle aches: no Diet compliance: yes Exercise: yes  H/o R 2nd and 3rd toe swelling episodically.  Resolved now.  No trauma.  Normal exam and I asked him to update me as needed.  Meds, vitals, and allergies reviewed.   ROS: Per HPI unless specifically indicated in ROS section   GEN: nad, alert and oriented HEENT: NCAT NECK: supple w/o LA CV: rrr.  PULM: ctab, no inc wob ABD: soft, +bs EXT: no edema SKIN: no acute rash  foot exam: Normal inspection No skin breakdown No calluses  Normal DP pulses Normal sensation to light touch and monofilament Nails normal  30 minutes were devoted to patient care in this encounter (this includes time spent reviewing the patient's file/history, interviewing and examining the patient,  counseling/reviewing plan with patient).

## 2020-03-16 NOTE — Assessment & Plan Note (Signed)
Living will d/w pt.  Wife designated, then daughters equally if wife were incapacitated, if patient were incapacitated.   Tetanus 2014 PNA up to date Flu 2021 Shingrix done.   [place], [place], 2021.   PSA d/w pt.  S/p neg bx prev.  PSA still elevated but improved.  I will update urology.  He has nocturia x1 with some occ urinary urgency.  He'll update me if sig enough to tx.   He'll check with [employer] about hearing aids.    He has intentional weight loss noted.  He cut out carbs and added more fiber.

## 2020-03-16 NOTE — Assessment & Plan Note (Signed)
No change in medications at this point.  If he gets lightheaded then I want him to cut back on lisinopril and update me.  He agrees with plan.

## 2020-03-16 NOTE — Assessment & Plan Note (Signed)
Continue Lipitor.  Continue work on diet and exercise.  I thanked him for his effort.

## 2020-03-16 NOTE — Assessment & Plan Note (Signed)
PSA d/w pt.  S/p neg bx prev.  PSA still elevated but improved.  I will update urology.  He has nocturia x1 with some occ urinary urgency.  He'll update me if sig enough to tx.

## 2020-03-16 NOTE — Assessment & Plan Note (Signed)
Living will d/w pt.  Wife designated, then daughters equally if wife were incapacitated, if patient were incapacitated.   ?

## 2020-03-20 ENCOUNTER — Encounter: Payer: Medicare Other | Admitting: Dermatology

## 2020-05-21 ENCOUNTER — Telehealth: Payer: Self-pay | Admitting: *Deleted

## 2020-05-21 DIAGNOSIS — U071 COVID-19: Secondary | ICD-10-CM

## 2020-05-21 NOTE — Telephone Encounter (Signed)
Patient called stating that his wife tested positive for covid Sunday and he did a test that day and it was negative. Patient stated that he stated feeling real bad Tuesday with a fever of 101.0 so he did another covid test and it was positive. Patient stated that has has been taking ibuprofen and his fever has broken. Patient stated that he has a lot of head congestion and a slight cough. Patient denies SOB or difficulty breathing. Patient stated that his wife has been in contact with the Covid Response team and they have prescribed her a medication that she is going to pickup at [facility]. Patient stated that is is wondering if Dr. Damita Dunnings and referred him there also. Patient was given ER precautions and she verbalized understanding. Patient was given information to quarantine. Patient was advised to rest, drink plenty of fluids and eat well-balanced meals.

## 2020-05-21 NOTE — Telephone Encounter (Signed)
Spoke with patient and advised referral to the covid treatment clinic was done

## 2020-05-21 NOTE — Telephone Encounter (Signed)
I am out of clinic today.  I put in the referral to the covid treatment clinic.  Thanks.

## 2020-05-22 ENCOUNTER — Telehealth: Payer: Self-pay

## 2020-05-22 ENCOUNTER — Other Ambulatory Visit: Payer: Self-pay | Admitting: Physician Assistant

## 2020-05-22 ENCOUNTER — Other Ambulatory Visit: Payer: Self-pay

## 2020-05-22 DIAGNOSIS — U071 COVID-19: Secondary | ICD-10-CM

## 2020-05-22 MED ORDER — NIRMATRELVIR/RITONAVIR (PAXLOVID)TABLET
3.0000 | ORAL_TABLET | Freq: Two times a day (BID) | ORAL | 0 refills | Status: DC
  Filled 2020-05-22: qty 30, 5d supply, fill #0

## 2020-05-22 MED ORDER — NIRMATRELVIR/RITONAVIR (PAXLOVID)TABLET
3.0000 | ORAL_TABLET | Freq: Two times a day (BID) | ORAL | 0 refills | Status: AC
  Filled 2020-05-22: qty 30, 5d supply, fill #0

## 2020-05-22 NOTE — Progress Notes (Signed)
Outpatient Oral COVID Treatment Note  I connected with Victor Ball on 05/22/2020/10:58 AM by telephone and verified that I am speaking with the correct person using two identifiers.  I discussed the limitations, risks, security, and privacy concerns of performing an evaluation and management service by telephone and the availability of in person appointments. I also discussed with the patient that there may be a patient responsible charge related to this service. The patient expressed understanding and agreed to proceed.  Patient location: home Provider location: office  Diagnosis: COVID-19 infection  Purpose of visit: Discussion of potential use of Molnupiravir or Paxlovid, a new treatment for mild to moderate COVID-19 viral infection in non-hospitalized patients.   Subjective: Patient is a 80 y.o. male who has been diagnosed with COVID 19 viral infection.  Their symptoms began on 5/3 with fever and faituge and sore throat.    Past Medical History:  Diagnosis Date  . Duodenal adenoma    duodenal nodule ( copied from order for consent)  . Hepatitis C, chronic ([facility])    treated prev with Harvoni, tested and cleared  . History of kidney stones   . Hyperlipidemia   . Hypertension   . Wears glasses     No Known Allergies   Current Outpatient Medications:  .  atorvastatin (LIPITOR) 10 MG tablet, Take 1 tablet (10 mg total) by mouth daily., Disp: 90 tablet, Rfl: 3 .  lisinopril (ZESTRIL) 5 MG tablet, Take 1 tablet (5 mg total) by mouth daily., Disp: 90 tablet, Rfl: 3  Objective: Pt sounds fine.  They are in no apparent distress.  Breathing is non labored.  Mood and behavior are normal.  Laboratory Data:  Recent Results (from the past 2160 hour(s))  PSA, Medicare     Status: Abnormal   Collection Time: 03/05/20  7:35 AM  Result Value Ref Range   PSA 8.56 (H) 0.10 - 4.00 ng/ml    Comment: Test performed using Access Hybritech PSA Assay, a parmagnetic partical, chemiluminecent  immunoassay.  Lipid panel     Status: None   Collection Time: 03/05/20  7:35 AM  Result Value Ref Range   Cholesterol 114 0 - 200 mg/dL    Comment: ATP III Classification       Desirable:  < 200 mg/dL               Borderline High:  200 - 239 mg/dL          High:  > = 240 mg/dL   Triglycerides 67.0 0.0 - 149.0 mg/dL    Comment: Normal:  <150 mg/dLBorderline High:  150 - 199 mg/dL   HDL 44.80 >39.00 mg/dL   VLDL 13.4 0.0 - 40.0 mg/dL   LDL Cholesterol 56 0 - 99 mg/dL   Total CHOL/HDL Ratio 3     Comment:                Men          Women1/2 Average Risk     3.4          3.3Average Risk          5.0          4.42X Average Risk          9.6          7.13X Average Risk          15.0          11.0  NonHDL 68.98     Comment: NOTE:  Non-HDL goal should be 30 mg/dL higher than patient's LDL goal (i.e. LDL goal of < 70 mg/dL, would have non-HDL goal of < 100 mg/dL)  Comprehensive metabolic panel     Status: Abnormal   Collection Time: 03/05/20  7:35 AM  Result Value Ref Range   Sodium 141 135 - 145 mEq/L   Potassium 4.4 3.5 - 5.1 mEq/L   Chloride 105 96 - 112 mEq/L   CO2 31 19 - 32 mEq/L   Glucose, Bld 106 (H) 70 - 99 mg/dL   BUN 18 6 - 23 mg/dL   Creatinine, Ser 0.83 0.40 - 1.50 mg/dL   Total Bilirubin 0.5 0.2 - 1.2 mg/dL   Alkaline Phosphatase 70 39 - 117 U/L   AST 12 0 - 37 U/L   ALT 11 0 - 53 U/L   Total Protein 6.4 6.0 - 8.3 g/dL   Albumin 3.9 3.5 - 5.2 g/dL   GFR 81.74 >60.00 mL/min    Comment: Calculated using the CKD-EPI Creatinine Equation (2021)   Calcium 8.9 8.4 - 10.5 mg/dL     Assessment: 80 y.o. male with mild/moderate COVID 19 viral infection diagnosed on 5/3 at high risk for progression to severe COVID 19.  Plan:  This patient is a 80 y.o. male that meets the following criteria for Emergency Use Authorization of: Paxlovid 1. Age >12 yr AND > 40 kg 2. SARS-COV-2 positive test 3. Symptom onset < 5 days 4. Mild-to-moderate COVID disease with high  risk for severe progression to hospitalization or death  I have spoken and communicated the following to the patient or parent/caregiver regarding: 1. Paxlovid is an unapproved drug that is authorized for use under an Emergency Use Authorization.  2. There are no adequate, approved, available products for the treatment of COVID-19 in adults who have mild-to-moderate COVID-19 and are at high risk for progressing to severe COVID-19, including hospitalization or death. 3. Other therapeutics are currently authorized. For additional information on all products authorized for treatment or prevention of COVID-19, please see [email or web address].  4. There are benefits and risks of taking this treatment as outlined in the "Fact Sheet for Patients and Caregivers."  5. "Fact Sheet for Patients and Caregivers" was reviewed with patient. A hard copy will be provided to patient from pharmacy prior to the patient receiving treatment. 6. Patients should continue to self-isolate and use infection control measures (e.g., wear mask, isolate, social distance, avoid sharing personal items, clean and disinfect "high touch" surfaces, and frequent handwashing) according to CDC guidelines.  7. The patient or parent/caregiver has the option to accept or refuse treatment. 8. Patient medication history was reviewed for potential drug interactions:Interaction with home meds: atorvastatin  9. Patient's GFR was calculated to be 94, and they were therefore prescribed Normal dose (GFR>60) - nirmatrelvir 150mg  tab (2 tablet) by mouth twice daily AND ritonavir 100mg  tab (1 tablet) by mouth twice daily   After reviewing above information with the patient, the patient agrees to receive Paxlovid.  Follow up instructions:    . Take prescription BID x 5 days as directed . Reach out to pharmacist for counseling on medication if  desired . For concerns regarding further COVID symptoms please follow up with your PCP or urgent care . For urgent or life-threatening issues, seek care at your local emergency department  The patient was provided an opportunity to ask questions, and all were answered. The patient agreed with the plan and  demonstrated an understanding of the instructions.   Script sent to [place] [facility] and opted to pick up RX.  The patient was advised to call their PCP or seek an in-person evaluation if the symptoms worsen or if the condition fails to improve as anticipated.   I provided 10 minutes of non face-to-face telephone visit time during this encounter, and > 50% was spent counseling as documented under my assessment & plan.  Angelena Form, PA-C 05/22/2020 /10:58 AM

## 2020-05-22 NOTE — Telephone Encounter (Signed)
Called to discuss with patient about COVID-19 symptoms and the use of one of the available treatments for those with mild to moderate Covid symptoms and at a high risk of hospitalization.  Pt appears to qualify for outpatient treatment due to co-morbid conditions and/or a member of an at-risk group in accordance with the FDA Emergency Use Authorization.    Symptom onset: Fever 05/20/20 Vaccinated: Yes Booster? Yes Immunocompromised? Yes Qualifiers: HTN,history of Hep C NIH Criteria:   Pt. Would like to speak with APP.  Marcello Moores

## 2020-05-29 DIAGNOSIS — Z20822 Contact with and (suspected) exposure to covid-19: Secondary | ICD-10-CM | POA: Diagnosis not present

## 2020-06-01 DIAGNOSIS — Z20822 Contact with and (suspected) exposure to covid-19: Secondary | ICD-10-CM | POA: Diagnosis not present

## 2020-09-12 ENCOUNTER — Telehealth: Payer: Self-pay

## 2020-09-12 NOTE — Telephone Encounter (Signed)
Left message on machine to call back  

## 2020-09-12 NOTE — Telephone Encounter (Signed)
Hospital-based enteroscopy 60 minute case should be enough. Thanks. GM

## 2020-09-12 NOTE — Telephone Encounter (Signed)
Dr Rush Landmark can you please review and advise what the pt needs at this time?

## 2020-09-12 NOTE — Telephone Encounter (Signed)
-----   Message from Yevette Edwards, RN sent at 09/13/2019  1:13 PM EDT ----- Regarding: Enteroscopy with EMR 65-monthenteroscopy with EMR 75-minute case. Thanks.  GM  See letter from 09/13/19

## 2020-09-15 ENCOUNTER — Other Ambulatory Visit: Payer: Self-pay

## 2020-09-15 DIAGNOSIS — D132 Benign neoplasm of duodenum: Secondary | ICD-10-CM

## 2020-09-15 NOTE — Telephone Encounter (Signed)
Enteroscopy scheduled, pt instructed and medications reviewed.  Patient instructions mailed to home.  Patient to call with any questions or concerns.

## 2020-09-15 NOTE — Telephone Encounter (Signed)
Enteroscopy scheduled for 10/30/20 at 930 am at [facility] with GM  Left message on machine to call back

## 2020-10-15 ENCOUNTER — Ambulatory Visit (INDEPENDENT_AMBULATORY_CARE_PROVIDER_SITE_OTHER): Payer: Self-pay | Admitting: Gastroenterology

## 2020-10-15 ENCOUNTER — Other Ambulatory Visit: Payer: Self-pay

## 2020-10-15 ENCOUNTER — Encounter: Payer: Self-pay | Admitting: Gastroenterology

## 2020-10-15 DIAGNOSIS — Z5329 Procedure and treatment not carried out because of patient's decision for other reasons: Secondary | ICD-10-CM

## 2020-10-21 NOTE — Progress Notes (Signed)
Attempted to obtain medical history via telephone, unable to reach at this time. I left a voicemail to return pre surgical testing department's phone call.  

## 2020-10-22 ENCOUNTER — Ambulatory Visit: Payer: Medicare Other | Admitting: Dermatology

## 2020-10-22 ENCOUNTER — Encounter: Payer: Self-pay | Admitting: Dermatology

## 2020-10-22 ENCOUNTER — Other Ambulatory Visit: Payer: Self-pay

## 2020-10-22 DIAGNOSIS — D1800 Hemangioma unspecified site: Secondary | ICD-10-CM | POA: Diagnosis not present

## 2020-10-22 DIAGNOSIS — L578 Other skin changes due to chronic exposure to nonionizing radiation: Secondary | ICD-10-CM

## 2020-10-22 DIAGNOSIS — L570 Actinic keratosis: Secondary | ICD-10-CM

## 2020-10-22 DIAGNOSIS — Z1283 Encounter for screening for malignant neoplasm of skin: Secondary | ICD-10-CM | POA: Diagnosis not present

## 2020-10-22 DIAGNOSIS — L821 Other seborrheic keratosis: Secondary | ICD-10-CM

## 2020-10-22 DIAGNOSIS — D229 Melanocytic nevi, unspecified: Secondary | ICD-10-CM | POA: Diagnosis not present

## 2020-10-22 DIAGNOSIS — Z872 Personal history of diseases of the skin and subcutaneous tissue: Secondary | ICD-10-CM

## 2020-10-22 DIAGNOSIS — L814 Other melanin hyperpigmentation: Secondary | ICD-10-CM | POA: Diagnosis not present

## 2020-10-22 NOTE — Patient Instructions (Signed)

## 2020-10-22 NOTE — Progress Notes (Signed)
   Follow-Up Visit   Subjective  Victor Ball is a 80 y.o. male who presents for the following: Annual Exam (Hx AK's - patient has noticed no new or changing moles, lesions, or spots). The patient presents for Total-Body Skin Exam (TBSE) for skin cancer screening and mole check.  The following portions of the chart were reviewed this encounter and updated as appropriate:   Tobacco  Allergies  Meds  Problems  Med Hx  Surg Hx  Fam Hx     Review of Systems:  No other skin or systemic complaints except as noted in HPI or Assessment and Plan.  Objective  Well appearing patient in no apparent distress; mood and affect are within normal limits.  A full examination was performed including scalp, head, eyes, ears, nose, lips, neck, chest, axillae, abdomen, back, buttocks, bilateral upper extremities, bilateral lower extremities, hands, feet, fingers, toes, fingernails, and toenails. All findings within normal limits unless otherwise noted below.  R ear x 3 (3) Erythematous thin papules/macules with gritty scale.    Assessment & Plan  AK (actinic keratosis) (3) R ear x 3  Destruction of lesion - R ear x 3 Complexity: simple   Destruction method: cryotherapy   Informed consent: discussed and consent obtained   Timeout:  patient name, date of birth, surgical site, and procedure verified Lesion destroyed using liquid nitrogen: Yes   Region frozen until ice ball extended beyond lesion: Yes   Outcome: patient tolerated procedure well with no complications   Post-procedure details: wound care instructions given    Skin cancer screening  Lentigines - Scattered tan macules - Due to sun exposure - Benign-appearing, observe - Recommend daily broad spectrum sunscreen SPF 30+ to sun-exposed areas, reapply every 2 hours as needed. - Call for any changes  Seborrheic Keratoses - Stuck-on, waxy, tan-brown papules and/or plaques  - Benign-appearing - Discussed benign etiology and  prognosis. - Observe - Call for any changes  Melanocytic Nevi - Tan-brown and/or pink-flesh-colored symmetric macules and papules - Benign appearing on exam today - Observation - Call clinic for new or changing moles - Recommend daily use of broad spectrum spf 30+ sunscreen to sun-exposed areas.   Hemangiomas - Red papules - Discussed benign nature - Observe - Call for any changes  Actinic Damage - Chronic condition, secondary to cumulative UV/sun exposure - diffuse scaly erythematous macules with underlying dyspigmentation - Recommend daily broad spectrum sunscreen SPF 30+ to sun-exposed areas, reapply every 2 hours as needed.  - Staying in the shade or wearing long sleeves, sun glasses (UVA+UVB protection) and wide brim hats (4-inch brim around the entire circumference of the hat) are also recommended for sun protection.  - Call for new or changing lesions.  Skin cancer screening performed today.  Return in about 1 year (around 10/22/2021) for TBSE.  Luther Redo, CMA, am acting as scribe for Sarina Ser, MD . Documentation: I have reviewed the above documentation for accuracy and completeness, and I agree with the above.  Sarina Ser, MD

## 2020-10-28 ENCOUNTER — Encounter: Payer: Self-pay | Admitting: Podiatry

## 2020-10-28 ENCOUNTER — Other Ambulatory Visit: Payer: Self-pay

## 2020-10-28 ENCOUNTER — Ambulatory Visit: Payer: Medicare Other | Admitting: Podiatry

## 2020-10-28 DIAGNOSIS — M79675 Pain in left toe(s): Secondary | ICD-10-CM

## 2020-10-28 DIAGNOSIS — M79674 Pain in right toe(s): Secondary | ICD-10-CM | POA: Diagnosis not present

## 2020-10-28 DIAGNOSIS — B351 Tinea unguium: Secondary | ICD-10-CM | POA: Diagnosis not present

## 2020-10-28 NOTE — Progress Notes (Signed)
  Subjective:  Patient ID: Victor Ball, male    DOB: [birth year],  MRN: [id number]  Chief Complaint  Patient presents with   Nail Problem    Nail trim    80 y.o. male returns for the above complaint.  Patient presents with thickened elongated dystrophic toenails x10.  Mild pain on palpation of.  Patient would like to have them debrided down.  He is not able to do it himself.  He denies any other acute complaints.  Objective:  There were no vitals filed for this visit. Podiatric Exam: Vascular: dorsalis pedis and posterior tibial pulses are palpable bilateral. Capillary return is immediate. Temperature gradient is WNL. Skin turgor WNL  Sensorium: Normal Semmes Weinstein monofilament test. Normal tactile sensation bilaterally. Nail Exam: Pt has thick disfigured discolored nails with subungual debris noted bilateral entire nail hallux through fifth toenails.  Pain on palpation to the nails. Ulcer Exam: There is no evidence of ulcer or pre-ulcerative changes or infection. Orthopedic Exam: Muscle tone and strength are WNL. No limitations in general ROM. No crepitus or effusions noted.  Skin: No Porokeratosis. No infection or ulcers    Assessment & Plan:   1. Pain due to onychomycosis of toenails of both feet     Patient was evaluated and treated and all questions answered.  Onychomycosis with pain  -Nails palliatively debrided as below. -Educated on self-care  Procedure: Nail Debridement Rationale: pain  Type of Debridement: manual, sharp debridement. Instrumentation: Nail nipper, rotary burr. Number of Nails: 10  Procedures and Treatment: Consent by patient was obtained for treatment procedures. The patient understood the discussion of treatment and procedures well. All questions were answered thoroughly reviewed. Debridement of mycotic and hypertrophic toenails, 1 through 5 bilateral and clearing of subungual debris. No ulceration, no infection noted.  Return Visit-Office  Procedure: Patient instructed to return to the office for a follow up visit 3 months for continued evaluation and treatment.  Boneta Lucks, DPM    No follow-ups on file.

## 2020-10-29 ENCOUNTER — Encounter (HOSPITAL_COMMUNITY): Payer: Self-pay | Admitting: Gastroenterology

## 2020-10-29 NOTE — Progress Notes (Signed)
DUE TO COVID-19 ONLY ONE VISITOR IS ALLOWED TO COME WITH YOU AND STAY IN THE WAITING ROOM ONLY DURING PRE OP AND PROCEDURE DAY OF SURGERY.   PCP - Dr Elsie Stain Cardiologist - Angelena Form, PA-C  Chest x-ray - n/a EKG - DOS Stress Test - 08/15/17 ECHO - 08/17/17 Cardiac Cath - n/a  ICD Pacemaker/Loop - n/a  Sleep Study -  n/a CPAP - none  Anesthesia review: Yes  STOP now taking any Aspirin (unless otherwise instructed by your surgeon), Aleve, Naproxen, Ibuprofen, Motrin, Advil, Goody's, BC's, all herbal medications, fish oil, and all vitamins.   Coronavirus Screening Covid test n/a - Ambulatory Surgery  Do you have any of the following symptoms:  Cough yes/no: No Fever (>100.28F)  yes/no: No Runny nose yes/no: No Sore throat yes/no: No Difficulty breathing/shortness of breath  yes/no: No  Have you traveled in the last 14 days and where? yes/no: No  Patient verbalized understanding of instructions that were given via phone.

## 2020-10-30 ENCOUNTER — Encounter (HOSPITAL_COMMUNITY)
Admission: RE | Disposition: A | Discharge status: Home / Self Care | Payer: Self-pay | Source: Home / Self Care | Attending: Gastroenterology

## 2020-10-30 ENCOUNTER — Ambulatory Visit (HOSPITAL_COMMUNITY)
Admission: RE | Admit: 2020-10-30 | Discharge: 2020-10-30 | Disposition: A | Discharge status: Home / Self Care | Payer: Medicare Other | Attending: Gastroenterology | Admitting: Gastroenterology

## 2020-10-30 ENCOUNTER — Encounter (HOSPITAL_COMMUNITY): Payer: Self-pay | Admitting: Gastroenterology

## 2020-10-30 ENCOUNTER — Ambulatory Visit (HOSPITAL_COMMUNITY): Payer: Medicare Other | Admitting: Emergency Medicine

## 2020-10-30 DIAGNOSIS — Z96643 Presence of artificial hip joint, bilateral: Secondary | ICD-10-CM | POA: Diagnosis not present

## 2020-10-30 DIAGNOSIS — K3189 Other diseases of stomach and duodenum: Secondary | ICD-10-CM | POA: Diagnosis not present

## 2020-10-30 DIAGNOSIS — K449 Diaphragmatic hernia without obstruction or gangrene: Secondary | ICD-10-CM | POA: Insufficient documentation

## 2020-10-30 DIAGNOSIS — D132 Benign neoplasm of duodenum: Secondary | ICD-10-CM | POA: Diagnosis not present

## 2020-10-30 DIAGNOSIS — Z79899 Other long term (current) drug therapy: Secondary | ICD-10-CM | POA: Diagnosis not present

## 2020-10-30 DIAGNOSIS — K2289 Other specified disease of esophagus: Secondary | ICD-10-CM | POA: Insufficient documentation

## 2020-10-30 DIAGNOSIS — Z09 Encounter for follow-up examination after completed treatment for conditions other than malignant neoplasm: Secondary | ICD-10-CM | POA: Diagnosis not present

## 2020-10-30 DIAGNOSIS — Z87891 Personal history of nicotine dependence: Secondary | ICD-10-CM | POA: Insufficient documentation

## 2020-10-30 DIAGNOSIS — K317 Polyp of stomach and duodenum: Secondary | ICD-10-CM

## 2020-10-30 DIAGNOSIS — K2970 Gastritis, unspecified, without bleeding: Secondary | ICD-10-CM | POA: Diagnosis not present

## 2020-10-30 DIAGNOSIS — E785 Hyperlipidemia, unspecified: Secondary | ICD-10-CM | POA: Diagnosis not present

## 2020-10-30 HISTORY — PX: BIOPSY: SHX5522

## 2020-10-30 HISTORY — PX: ENTEROSCOPY: SHX5533

## 2020-10-30 HISTORY — PX: POLYPECTOMY: SHX5525

## 2020-10-30 SURGERY — ENTEROSCOPY
Anesthesia: Monitor Anesthesia Care

## 2020-10-30 MED ORDER — OMEPRAZOLE MAGNESIUM 20 MG PO TBEC: 20.0000 mg | DELAYED_RELEASE_TABLET | Freq: Every day | ORAL | 0 refills | Status: DC

## 2020-10-30 MED ORDER — LACTATED RINGERS IV SOLN
INTRAVENOUS | Status: DC
  Administered 2020-10-30: 1000 mL via INTRAVENOUS

## 2020-10-30 MED ORDER — LIDOCAINE 2% (20 MG/ML) 5 ML SYRINGE
INTRAMUSCULAR | Status: DC | PRN
  Administered 2020-10-30: 80 mg via INTRAVENOUS

## 2020-10-30 MED ORDER — SODIUM CHLORIDE 0.9 % IV SOLN: INTRAVENOUS | Status: DC

## 2020-10-30 MED ORDER — PROPOFOL 10 MG/ML IV BOLUS
INTRAVENOUS | Status: DC | PRN
  Administered 2020-10-30: 20 mg via INTRAVENOUS
  Administered 2020-10-30: 15 mg via INTRAVENOUS

## 2020-10-30 MED ORDER — PROPOFOL 500 MG/50ML IV EMUL
INTRAVENOUS | Status: DC | PRN
  Administered 2020-10-30: 150 ug/kg/min via INTRAVENOUS

## 2020-10-30 MED ORDER — FENTANYL CITRATE (PF) 100 MCG/2ML IJ SOLN: 25.0000 ug | INTRAMUSCULAR | Status: DC | PRN

## 2020-10-30 NOTE — Anesthesia Preprocedure Evaluation (Addendum)
Anesthesia Evaluation  Patient identified by MRN, date of birth, ID band Patient awake    Reviewed: Allergy & Precautions, NPO status , Patient's Chart, lab work & pertinent test results  History of Anesthesia Complications Negative for: history of anesthetic complications  Airway Mallampati: II  TM Distance: >3 FB Neck ROM: Full    Dental  (+) Chipped, Caps, Dental Advisory Given   Pulmonary former smoker,    breath sounds clear to auscultation       Cardiovascular hypertension, Pt. on medications (-) angina Rhythm:Regular Rate:Normal  '19 ECHO: mild LVH. Systolic function was normal, EF 60-65%. Wall motion was normal; no regional wall motion abnormalities. grade 1 DD, no significant valvular abnormalities   Neuro/Psych negative neurological ROS     GI/Hepatic GERD  Controlled,(+) Hepatitis -, CDuodenal adenoma   Endo/Other  negative endocrine ROS  Renal/GU H/o stones     Musculoskeletal  (+) Arthritis , Osteoarthritis,    Abdominal   Peds  Hematology negative hematology ROS (+)   Anesthesia Other Findings   Reproductive/Obstetrics                            Anesthesia Physical Anesthesia Plan  ASA: 2  Anesthesia Plan: MAC   Post-op Pain Management:    Induction:   PONV Risk Score and Plan: 1  Airway Management Planned: Natural Airway and Simple Face Mask  Additional Equipment:   Intra-op Plan:   Post-operative Plan:   Informed Consent: I have reviewed the patients History and Physical, chart, labs and discussed the procedure including the risks, benefits and alternatives for the proposed anesthesia with the patient or authorized representative who has indicated his/her understanding and acceptance.     Dental advisory given  Plan Discussed with: CRNA and Surgeon  Anesthesia Plan Comments:        Anesthesia Quick Evaluation

## 2020-10-30 NOTE — Anesthesia Postprocedure Evaluation (Signed)
Anesthesia Post Note  Patient: Victor Ball  Procedure(s) Performed: ENTEROSCOPY BIOPSY POLYPECTOMY     Patient location during evaluation: PACU Anesthesia Type: MAC Level of consciousness: awake and alert, awake and oriented Pain management: pain level controlled Vital Signs Assessment: post-procedure vital signs reviewed and stable Respiratory status: nonlabored ventilation, spontaneous breathing and respiratory function stable Cardiovascular status: stable and blood pressure returned to baseline Postop Assessment: no apparent nausea or vomiting and able to ambulate Anesthetic complications: no   No notable events documented.  Last Vitals:  Vitals:   10/30/20 1013 10/30/20 1028  BP: 140/77 (!) 142/77  Pulse: 60 (!) 52  Resp: 13 11  Temp: (!) 36.3 C (!) 36.3 C  SpO2: 97% 99%    Last Pain:  Vitals:   10/30/20 1028  TempSrc:   PainSc: 0-No pain                 Johnell Bas,E. Avanthika Dehnert

## 2020-10-30 NOTE — H&P (Signed)
GASTROENTEROLOGY PROCEDURE H&P NOTE   Primary Care Physician: Tonia Ghent, MD  HPI: Victor Ball is a 80 y.o. male who presents for enteroscopy for follow-up of previous duodenal adenoma in D3 and prior resection of D2 and D3 submucosal lesions and follow-up dysphagia (improved since dilation last year).  Past Medical History:  Diagnosis Date   Duodenal adenoma    duodenal nodule ( copied from order for consent)   Hepatitis C, chronic ([place])    treated prev with Harvoni, tested and cleared   History of kidney stones    passed stone   Hyperlipidemia    Hypertension    Wears glasses    Past Surgical History:  Procedure Laterality Date   APPENDECTOMY  1948   ENDOSCOPIC MUCOSAL RESECTION N/A 09/10/2019   Procedure: ENDOSCOPIC MUCOSAL RESECTION;  Surgeon: Irving Copas., MD;  Location: [facility];  Service: Gastroenterology;  Laterality: N/A;   ESOPHAGEAL DILATION  09/10/2019   Procedure: ESOPHAGEAL DILATION;  Surgeon: Rush Landmark Telford Nab., MD;  Location: [facility];  Service: Gastroenterology;;   ESOPHAGOGASTRODUODENOSCOPY (EGD) WITH PROPOFOL N/A 06/04/2019   Procedure: ESOPHAGOGASTRODUODENOSCOPY (EGD) WITH PROPOFOL;  Surgeon: Virgel Manifold, MD;  Location: [facility];  Service: Endoscopy;  Laterality: N/A;   ESOPHAGOGASTRODUODENOSCOPY (EGD) WITH PROPOFOL N/A 09/10/2019   Procedure: ESOPHAGOGASTRODUODENOSCOPY (EGD) WITH PROPOFOL;  Surgeon: Rush Landmark Telford Nab., MD;  Location: [facility];  Service: Gastroenterology;  Laterality: N/A;   HEMOSTASIS CLIP PLACEMENT  09/10/2019   Procedure: HEMOSTASIS CLIP PLACEMENT;  Surgeon: Irving Copas., MD;  Location: [facility];  Service: Gastroenterology;;   LIVER BIOPSY     POLYPECTOMY  09/10/2019   Procedure: POLYPECTOMY;  Surgeon: Irving Copas., MD;  Location: [facility];  Service: Gastroenterology;;   PROSTATE BIOPSY     neg, x2   SUBMUCOSAL LIFTING INJECTION  09/10/2019   Procedure:  SUBMUCOSAL LIFTING INJECTION;  Surgeon: Irving Copas., MD;  Location: [facility];  Service: Gastroenterology;;   SUBMUCOSAL TATTOO INJECTION  09/10/2019   Procedure: SUBMUCOSAL TATTOO INJECTION;  Surgeon: Irving Copas., MD;  Location: [place];  Service: Gastroenterology;;   teeth extractions     TOE FUSION  1985   left great toe   TONSILLECTOMY AND ADENOIDECTOMY  1945   TOTAL HIP ARTHROPLASTY Left 11/09/2016   Procedure: LEFT TOTAL HIP ARTHROPLASTY ANTERIOR APPROACH;  Surgeon: Mcarthur Rossetti, MD;  Location: [facility];  Service: Orthopedics;  Laterality: Left;   TOTAL HIP ARTHROPLASTY Right 09/13/2017   Procedure: RIGHT TOTAL HIP ARTHROPLASTY ANTERIOR APPROACH;  Surgeon: Mcarthur Rossetti, MD;  Location: [facility];  Service: Orthopedics;  Laterality: Right;   UPPER ESOPHAGEAL ENDOSCOPIC ULTRASOUND (EUS) N/A 09/10/2019   Procedure: UPPER ESOPHAGEAL ENDOSCOPIC ULTRASOUND (EUS);  Surgeon: Irving Copas., MD;  Location: [facility];  Service: Gastroenterology;  Laterality: N/A;   VASECTOMY     Current Facility-Administered Medications  Medication Dose Route Frequency Provider Last Rate Last Admin   0.9 %  sodium chloride infusion   Intravenous Continuous Mansouraty, Telford Nab., MD       lactated ringers infusion   Intravenous Continuous Annye Asa, MD 50 mL/hr at 10/30/20 0857 1,000 mL at 10/30/20 0857    Current Facility-Administered Medications:    0.9 %  sodium chloride infusion, , Intravenous, Continuous, Mansouraty, Telford Nab., MD   lactated ringers infusion, , Intravenous, Continuous, Annye Asa, MD, Last Rate: 50 mL/hr at 10/30/20 0857, 1,000 mL at 10/30/20 0857 No Known Allergies Family History  Problem Relation Age of Onset  Heart disease Father    Diabetes Father    Heart attack Father    Celiac disease Sister    Heart disease Brother        MI then stents   Heart attack Brother    Stroke Brother    Cancer Paternal Aunt     Prostate cancer Neg Hx    Colon cancer Neg Hx    Esophageal cancer Neg Hx    Inflammatory bowel disease Neg Hx    Liver disease Neg Hx    Pancreatic cancer Neg Hx    Rectal cancer Neg Hx    Stomach cancer Neg Hx    Social History   Socioeconomic History   Marital status: Married    Spouse name: Not on file   Number of children: 2   Years of education: Not on file   Highest education level: Not on file  Occupational History   Occupation: Insurance agent---retired    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  Tobacco Use   Smoking status: Former    Types: Cigarettes    Quit date: 01/18/1973    Years since quitting: 47.8   Smokeless tobacco: Never  Vaping Use   Vaping Use: Never used  Substance and Sexual Activity   Alcohol use: Yes    Alcohol/week: 7.0 standard drinks    Types: 4 Glasses of wine, 3 Standard drinks or equivalent per week    Comment: 1 drink x 7 days - either wine or cocktail   Drug use: No   Sexual activity: Yes  Other Topics Concern   Not on file  Social History Narrative   2 daughters   Married 1961   Partially retired from Insurance underwriter, still consulting some as of 2022      Has living will   Wife, then daughters equally if wife were incapacitated, has health care POA.   Would accept resuscitation attempts   Not sure about tube feeds   Social Determinants of Health   Financial Resource Strain: Low Risk    Difficulty of Paying Living Expenses: Not hard at all  Food Insecurity: No Food Insecurity   Worried About Charity fundraiser in the Last Year: Never true   [place] in the Last Year: Never true  Transportation Needs: No Transportation Needs   Lack of Transportation (Medical): No   Lack of Transportation (Non-Medical): No  Physical Activity: Sufficiently Active   Days of Exercise per Week: 5 days   Minutes of Exercise per Session: 150+ min  Stress: No Stress Concern Present   Feeling of Stress : Not at all  Social  Connections: Not on file  Intimate Partner Violence: Not At Risk   Fear of Current or Ex-Partner: No   Emotionally Abused: No   Physically Abused: No   Sexually Abused: No    Physical Exam: Today's Vitals   10/29/20 0813 10/30/20 0833  BP:  (!) 171/88  Resp:  16  Temp:  (!) 97.5 F (36.4 C)  TempSrc:  Temporal  SpO2:  97%  Weight: 88.5 kg 88.5 kg  Height: 6\' 2"  (1.88 m) 6\' 2"  (1.88 m)  PainSc:  0-No pain   Body mass index is 25.04 kg/m. GEN: NAD EYE: Sclerae anicteric ENT: MMM CV: Non-tachycardic GI: Soft, NT/ND NEURO:  Alert & Oriented x 3  Lab Results: No results for input(s): WBC, HGB, HCT, PLT in the last 72 hours. BMET No results for input(s): NA, K, CL, CO2, GLUCOSE, BUN,  CREATININE, CALCIUM in the last 72 hours. LFT No results for input(s): PROT, ALBUMIN, AST, ALT, ALKPHOS, BILITOT, BILIDIR, IBILI in the last 72 hours. PT/INR No results for input(s): LABPROT, INR in the last 72 hours.   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  The risks and benefits of endoscopic evaluation/treatment were discussed with the patient and/or family; these include but are not limited to the risk of perforation, infection, bleeding, missed lesions, lack of diagnosis, severe illness requiring hospitalization, as well as anesthesia and sedation related illnesses.  The patient's history has been reviewed, patient examined, no change in status, and deemed stable for procedure.  The patient and/or family is agreeable to proceed.    Justice Britain, MD [facility] Office # [phone]

## 2020-10-30 NOTE — Transfer of Care (Signed)
Immediate Anesthesia Transfer of Care Note  Patient: Victor Ball  Procedure(s) Performed: ENTEROSCOPY BIOPSY POLYPECTOMY  Patient Location: PACU  Anesthesia Type:MAC  Level of Consciousness: awake, alert  and oriented  Airway & Oxygen Therapy: Patient Spontanous Breathing and Patient connected to nasal cannula oxygen  Post-op Assessment: Report given to RN, Post -op Vital signs reviewed and stable and Patient moving all extremities X 4  Post vital signs: Reviewed and stable  Last Vitals:  Vitals Value Taken Time  BP 140/77 10/30/20 1013  Temp    Pulse 35 10/30/20 1014  Resp 14 10/30/20 1014  SpO2 98 % 10/30/20 1014  Vitals shown include unvalidated device data.  Last Pain:  Vitals:   10/30/20 0833  TempSrc: Temporal  PainSc: 0-No pain         Complications: No notable events documented.

## 2020-10-30 NOTE — Op Note (Signed)
[facility] Patient Name: Victor Ball Procedure Date : 10/30/2020 MRN: [id number] Attending MD: Justice Britain , MD Date of Birth: [birth year] CSN: [id number] Age: 80 Admit Type: Outpatient Procedure:                Small bowel enteroscopy Indications:              Follow-up of adenomatous polyps in the duodenum,                            Follow-up of polyps in the duodenum, Dysphagia                            (improved currently) Providers:                Justice Britain, MD, Doristine Johns, RN,                            Jaci Carrel, RN, Laverda Sorenson, Technician,                            Virgilio Belling. Huel Cote, CRNA Referring MD:             Lennette Bihari. Bonna Gains MD, MD, Elveria Rising. Damita Dunnings, MD Medicines:                Monitored Anesthesia Care Complications:            No immediate complications. Estimated Blood Loss:     Estimated blood loss was minimal. Procedure:                After obtaining informed consent, the endoscope was                            passed under direct vision. Throughout the                            procedure, the patient's blood pressure, pulse, and                            oxygen saturations were monitored continuously. The                            [id number]-HQ190TL ([id number]) Olympus peds colonoscope was                            introduced through the mouth and advanced to the                            proximal jejunum. The small bowel enteroscopy was                            accomplished without difficulty. The patient                            tolerated the procedure. Scope In: Scope Out: Findings:      Possible Zenker's diverticulum noted.      No gross lesions were noted  in the proximal esophagus and in the mid       esophagus.      Scattered islands of salmon-colored mucosa were present from 39 to 41       cm. No other visible abnormalities were present. Biopsies were taken       with a cold forceps for histology to  rule out Barrett's.      The Z-line was irregular and was found 41 cm from the incisors.      2 cm hiatal hernia noted.      Patchy mildly erythematous mucosa without bleeding was found in the       entire examined stomach. Previously biopsied and negative for HP so not       rebiopsied.      A single scar was found in the second portion of the duodenum with       adjacent tattoo. There was some polypoid appearing tissue present -       query clip artifact. To ensure no adenomatous tissue, this was biopsied       with a cold snare for histology.      A single 3 mm sessile polypoid area of tissue with no bleeding was found       distal to the prior D2 scar site but still in the second portion of the       duodenum and proximal to the ampulla. The polypoid tissue was removed       with a cold snare. Resection and retrieval were complete.      A single scar was found in the third portion of the duodenum with       adjacent tattoo.      Normal mucosa was found in the visualized proximal jejunum. Impression:               - Possible Zenker's diverticulum noted.                           - No gross lesions in esophagus proximally                           - Salmon-colored mucosal islands suggestive of                            Barrett's esophagus distally - biopsied.                           - Z-line irregular, 41 cm from the incisors.                           - 2 cm hiatal hernia noted.                           - Erythematous mucosa in the stomach - previously                            biopsied and negative for HP.                           - Duodenal scar in D2 near prior tattoo with some  polypoid tissue - removed to ensure no adenomatous                            tissue.                           - A single duodenal polypoid lesion proximal to                            ampulla. Resected and retrieved.                           - Duodenal scar in D3.                            - Normal mucosa was found in the visualized                            proximal jejunum. Recommendation:           - The patient will be observed post-procedure,                            until all discharge criteria are met.                           - Discharge patient to home.                           - Patient has a contact number available for                            emergencies. The signs and symptoms of potential                            delayed complications were discussed with the                            patient. Return to normal activities tomorrow.                            Written discharge instructions were provided to the                            patient.                           - Resume previous diet.                           - Start Omeprazole 20 mg daily.                           - Await pathology results.                           - Repeat the small bowel enteroscopy for  surveillance based on pathology results. If any                            adenomatous tissue then repeat in 1-year. If no                            adenomatous tissue then consider repeat in 2-3                            years (based on health at that time).                           - If evidence of Barrett's esophagus is found then                            consider repeat evaluation in 2-3 years and likely                            daily PPI therapy.                           - If dysphagia symptoms recur then consider Barium                            Swallow.                           - The findings and recommendations were discussed                            with the patient.                           - The findings and recommendations were discussed                            with the patient's family. Procedure Code(s):        --- Professional ---                           [phone], Small intestinal endoscopy, enteroscopy                             beyond second portion of duodenum, not including                            ileum; with removal of tumor(s), polyp(s), or other                            lesion(s) by snare technique Diagnosis Code(s):        --- Professional ---                           K22.8, Other specified diseases of esophagus  K31.89, Other diseases of stomach and duodenum                           K31.7, Polyp of stomach and duodenum                           D13.2, Benign neoplasm of duodenum                           R13.10, Dysphagia, unspecified CPT copyright 2019 [employer]. All rights reserved. The codes documented in this report are preliminary and upon coder review may  be revised to meet current compliance requirements. Justice Britain, MD 10/30/2020 10:35:10 AM Number of Addenda: 0

## 2020-11-02 ENCOUNTER — Encounter (HOSPITAL_COMMUNITY): Payer: Self-pay | Admitting: Gastroenterology

## 2020-11-04 LAB — SURGICAL PATHOLOGY

## 2020-11-05 ENCOUNTER — Encounter: Payer: Self-pay | Admitting: Emergency Medicine

## 2020-11-05 ENCOUNTER — Encounter: Payer: Self-pay | Admitting: Gastroenterology

## 2020-11-05 ENCOUNTER — Other Ambulatory Visit: Payer: Self-pay

## 2020-11-05 ENCOUNTER — Ambulatory Visit
Admission: EM | Admit: 2020-11-05 | Discharge: 2020-11-05 | Disposition: A | Discharge status: Home / Self Care | Payer: Medicare Other | Attending: Emergency Medicine | Admitting: Emergency Medicine

## 2020-11-05 DIAGNOSIS — R21 Rash and other nonspecific skin eruption: Secondary | ICD-10-CM | POA: Diagnosis not present

## 2020-11-05 MED ORDER — CEPHALEXIN 500 MG PO CAPS: 500.0000 mg | ORAL_CAPSULE | Freq: Four times a day (QID) | ORAL | 0 refills | Status: AC

## 2020-11-05 MED ORDER — MUPIROCIN CALCIUM 2 % EX CREA: 1.0000 "application " | TOPICAL_CREAM | Freq: Two times a day (BID) | CUTANEOUS | 0 refills | Status: AC

## 2020-11-05 NOTE — ED Triage Notes (Signed)
Pt c/o rash on right wrist x 3 days

## 2020-11-05 NOTE — ED Provider Notes (Signed)
[facility]  ____________________________________________  Time seen: Approximately 8:34 AM  I have reviewed the triage vital signs and the nursing notes.   HISTORY  Chief Complaint Rash   Historian Patient    HPI Victor Ball is a 80 y.o. male presents to the emergency department with macular erythema along the dorsal aspect of the right wrist where patient had an IV established.  Patient states that erythema seems to have progressively worsened over 3 days and now has some mild induration and small vesicles.  Patient denies knowledge of prior cellulitis in the past.  No significant swelling of the right forearm or upper arm.  Patient denies chest pain, chest tightness or shortness of breath.   Past Medical History:  Diagnosis Date   Duodenal adenoma    duodenal nodule ( copied from order for consent)   Hepatitis C, chronic ([place])    treated prev with Harvoni, tested and cleared   History of kidney stones    passed stone   Hyperlipidemia    Hypertension    Wears glasses      Immunizations up to date:  Yes.     Past Medical History:  Diagnosis Date   Duodenal adenoma    duodenal nodule ( copied from order for consent)   Hepatitis C, chronic ([place])    treated prev with Harvoni, tested and cleared   History of kidney stones    passed stone   Hyperlipidemia    Hypertension    Wears glasses     Patient Active Problem List   Diagnosis Date Noted   Duodenal adenoma 07/15/2019   Duodenal nodule 07/15/2019   Abnormal endoscopy of upper gastrointestinal tract 07/15/2019   Compression, esophagus    Stomach irritation    Gastric polyp    Esophageal dysphagia 03/07/2019   Aorta disorder ([place]) 03/07/2019   Cough 03/05/2018   Status post total replacement of right hip 09/13/2017   Essential hypertension 08/10/2017   Preoperative cardiovascular examination 08/10/2017   Unilateral primary osteoarthritis, right hip 07/13/2017   Status post total replacement  of left hip 11/09/2016   Pain of left hip joint 10/18/2016   Unilateral primary osteoarthritis, left hip 10/18/2016   Lumbar spondylosis 07/19/2016   Healthcare maintenance 02/19/2016   ED (erectile dysfunction) 02/19/2016   Rash 02/19/2016   Hyperglycemia 02/19/2016   Advance care planning 01/06/2014   Osteoarthritis, hip, bilateral 10/31/2012   Medicare annual wellness visit, subsequent 10/24/2012   Left hip pain 10/04/2012   Elevated PSA 10/06/2011   Hyperlipidemia    Kidney stone    History of hepatitis C, previously treated with Harvoni    FH: CAD (coronary artery disease) 11/27/2009    Past Surgical History:  Procedure Laterality Date   APPENDECTOMY  1948   BIOPSY  10/30/2020   Procedure: BIOPSY;  Surgeon: Irving Copas., MD;  Location: [place];  Service: Gastroenterology;;   ENDOSCOPIC MUCOSAL RESECTION N/A 09/10/2019   Procedure: ENDOSCOPIC MUCOSAL RESECTION;  Surgeon: Irving Copas., MD;  Location: [facility]COPY;  Service: Gastroenterology;  Laterality: N/A;   ENTEROSCOPY N/A 10/30/2020   Procedure: ENTEROSCOPY;  Surgeon: Rush Landmark Telford Nab., MD;  Location: [place];  Service: Gastroenterology;  Laterality: N/A;   ESOPHAGEAL DILATION  09/10/2019   Procedure: ESOPHAGEAL DILATION;  Surgeon: Rush Landmark Telford Nab., MD;  Location: [place];  Service: Gastroenterology;;   ESOPHAGOGASTRODUODENOSCOPY (EGD) WITH PROPOFOL N/A 06/04/2019   Procedure: ESOPHAGOGASTRODUODENOSCOPY (EGD) WITH PROPOFOL;  Surgeon: Virgel Manifold, MD;  Location: [facility] ENDOSCOPY;  Service: Endoscopy;  Laterality: N/A;   ESOPHAGOGASTRODUODENOSCOPY (EGD) WITH PROPOFOL N/A 09/10/2019   Procedure: ESOPHAGOGASTRODUODENOSCOPY (EGD) WITH PROPOFOL;  Surgeon: Rush Landmark Telford Nab., MD;  Location: [place];  Service: Gastroenterology;  Laterality: N/A;   HEMOSTASIS CLIP PLACEMENT  09/10/2019   Procedure: HEMOSTASIS CLIP PLACEMENT;  Surgeon: Irving Copas., MD;   Location: [place];  Service: Gastroenterology;;   LIVER BIOPSY     POLYPECTOMY  09/10/2019   Procedure: POLYPECTOMY;  Surgeon: Irving Copas., MD;  Location: [place];  Service: Gastroenterology;;   POLYPECTOMY  10/30/2020   Procedure: POLYPECTOMY;  Surgeon: Irving Copas., MD;  Location: [place];  Service: Gastroenterology;;   PROSTATE BIOPSY     neg, x2   SUBMUCOSAL LIFTING INJECTION  09/10/2019   Procedure: SUBMUCOSAL LIFTING INJECTION;  Surgeon: Irving Copas., MD;  Location: [place];  Service: Gastroenterology;;   SUBMUCOSAL TATTOO INJECTION  09/10/2019   Procedure: SUBMUCOSAL TATTOO INJECTION;  Surgeon: Irving Copas., MD;  Location: [place];  Service: Gastroenterology;;   teeth extractions     TOE FUSION  1985   left great toe   TONSILLECTOMY AND ADENOIDECTOMY  1945   TOTAL HIP ARTHROPLASTY Left 11/09/2016   Procedure: LEFT TOTAL HIP ARTHROPLASTY ANTERIOR APPROACH;  Surgeon: Mcarthur Rossetti, MD;  Location: [place];  Service: Orthopedics;  Laterality: Left;   TOTAL HIP ARTHROPLASTY Right 09/13/2017   Procedure: RIGHT TOTAL HIP ARTHROPLASTY ANTERIOR APPROACH;  Surgeon: Mcarthur Rossetti, MD;  Location: [place];  Service: Orthopedics;  Laterality: Right;   UPPER ESOPHAGEAL ENDOSCOPIC ULTRASOUND (EUS) N/A 09/10/2019   Procedure: UPPER ESOPHAGEAL ENDOSCOPIC ULTRASOUND (EUS);  Surgeon: Irving Copas., MD;  Location: [place];  Service: Gastroenterology;  Laterality: N/A;   VASECTOMY      Prior to Admission medications   Medication Sig Start Date End Date Taking? Authorizing Provider  atorvastatin (LIPITOR) 10 MG tablet Take 1 tablet (10 mg total) by mouth daily. 03/13/20  Yes Tonia Ghent, MD  cephALEXin (KEFLEX) 500 MG capsule Take 1 capsule (500 mg total) by mouth 4 (four) times daily for 7 days. 11/05/20 11/12/20 Yes Vallarie Mare M, PA-C  lisinopril (ZESTRIL) 5 MG tablet Take 1 tablet (5 mg total) by  mouth daily. 03/13/20  Yes Tonia Ghent, MD  mupirocin cream (BACTROBAN) 2 % Apply 1 application topically 2 (two) times daily for 7 days. 11/05/20 11/12/20 Yes Vallarie Mare M, PA-C  omeprazole (PRILOSEC OTC) 20 MG tablet Take 1 tablet (20 mg total) by mouth daily. 10/30/20 10/30/21 Yes Mansouraty, Telford Nab., MD    Allergies Patient has no known allergies.  Family History  Problem Relation Age of Onset   Heart disease Father    Diabetes Father    Heart attack Father    Celiac disease Sister    Heart disease Brother        MI then stents   Heart attack Brother    Stroke Brother    Cancer Paternal Aunt    Prostate cancer Neg Hx    Colon cancer Neg Hx    Esophageal cancer Neg Hx    Inflammatory bowel disease Neg Hx    Liver disease Neg Hx    Pancreatic cancer Neg Hx    Rectal cancer Neg Hx    Stomach cancer Neg Hx     Social History Social History   Tobacco Use   Smoking status: Former    Types: Cigarettes    Quit date: 01/18/1973    Years  since quitting: 47.8   Smokeless tobacco: Never  Vaping Use   Vaping Use: Never used  Substance Use Topics   Alcohol use: Yes    Alcohol/week: 7.0 standard drinks    Types: 4 Glasses of wine, 3 Standard drinks or equivalent per week    Comment: 1 drink x 7 days - either wine or cocktail   Drug use: No     Review of Systems  Constitutional: No fever/chills Eyes:  No discharge ENT: No upper respiratory complaints. Respiratory: no cough. No SOB/ use of accessory muscles to breath Gastrointestinal:   No nausea, no vomiting.  No diarrhea.  No constipation. Musculoskeletal: Negative for musculoskeletal pain. Skin: Patient has rash.     ____________________________________________   PHYSICAL EXAM:  VITAL SIGNS: ED Triage Vitals  Enc Vitals Group     BP 11/05/20 0824 (!) 145/77     Pulse Rate 11/05/20 0824 61     Resp 11/05/20 0824 16     Temp 11/05/20 0824 97.9 F (36.6 C)     Temp Source 11/05/20 0824 Oral      SpO2 11/05/20 0824 97 %     Weight --      Height --      Head Circumference --      Peak Flow --      Pain Score 11/05/20 0822 0     Pain Loc --      Pain Edu? --      Excl. in [place]? --      Constitutional: Alert and oriented. Well appearing and in no acute distress. Eyes: Conjunctivae are normal. PERRL. EOMI. Head: Atraumatic. Cardiovascular: Normal rate, regular rhythm. Normal S1 and S2.  Good peripheral circulation. Respiratory: Normal respiratory effort without tachypnea or retractions. Lungs CTAB. Good air entry to the bases with no decreased or absent breath sounds Gastrointestinal: Bowel sounds x 4 quadrants. Soft and nontender to palpation. No guarding or rigidity. No distention. Musculoskeletal: Full range of motion to all extremities. No obvious deformities noted Neurologic:  Normal for age. No gross focal neurologic deficits are appreciated.  Skin: Patient has macular erythema along the dorsal aspect of the right wrist with some associated induration and small vesicle formation. Psychiatric: Mood and affect are normal for age. Speech and behavior are normal.   ____________________________________________   LABS (all labs ordered are listed, but only abnormal results are displayed)  Labs Reviewed - No data to display ____________________________________________  EKG   ____________________________________________    No results found.  ____________________________________________    PROCEDURES  Procedure(s) performed:     Procedures     Medications - No data to display   ____________________________________________   INITIAL IMPRESSION / ASSESSMENT AND PLAN / ED COURSE  Pertinent labs & imaging results that were available during my care of the patient were reviewed by me and considered in my medical decision making (see chart for details).      Assessment and plan Rash 80 year old male presents to the emergency department with a rash along  the dorsal aspect of the right wrist.  I think there is a component of both thrombophlebitis and cellulitis.  Explained to patient I will start him on Keflex 4 times daily for the next 7 days.  No penicillin allergies as well as topical mupirocin.  I did recommend warm, clean compresses 2-3 times a day with gentle massage for thrombophlebitis.  Return precautions were given to return with new or worsening symptoms.  All patient questions were answered.  ____________________________________________  FINAL CLINICAL IMPRESSION(S) / ED DIAGNOSES  Final diagnoses:  Rash and nonspecific skin eruption      NEW MEDICATIONS STARTED DURING THIS VISIT:  ED Discharge Orders          Ordered    cephALEXin (KEFLEX) 500 MG capsule  4 times daily        11/05/20 0831    mupirocin cream (BACTROBAN) 2 %  2 times daily        11/05/20 0831                This chart was dictated using voice recognition software/Dragon. Despite best efforts to proofread, errors can occur which can change the meaning. Any change was purely unintentional.     Lannie Fields, [place] 11/05/20 [phone]

## 2020-11-05 NOTE — Discharge Instructions (Addendum)
Take 2 tablets of Keflex in the morning and 2 tablets at night. Apply topical mupirocin twice daily for the next 7 days. Sometimes thrombophlebitis occurs after starting IV.  You can use clean, hot compresses 2-3 times a day in the affected area.

## 2020-11-13 ENCOUNTER — Other Ambulatory Visit: Payer: Self-pay

## 2020-11-13 ENCOUNTER — Ambulatory Visit (INDEPENDENT_AMBULATORY_CARE_PROVIDER_SITE_OTHER): Payer: Medicare Other | Admitting: Family Medicine

## 2020-11-13 ENCOUNTER — Encounter: Payer: Self-pay | Admitting: Family Medicine

## 2020-11-13 DIAGNOSIS — R21 Rash and other nonspecific skin eruption: Secondary | ICD-10-CM | POA: Diagnosis not present

## 2020-11-13 MED ORDER — PREDNISONE 10 MG PO TABS: ORAL_TABLET | ORAL | 0 refills | Status: DC

## 2020-11-13 NOTE — Progress Notes (Signed)
This visit occurred during the SARS-CoV-2 public health emergency.  Safety protocols were in place, including screening questions prior to the visit, additional usage of staff PPE, and extensive cleaning of exam room while observing appropriate contact time as indicated for disinfecting solutions.  Redness started a few days after having R wrist IV placement.  S/p ER eval and started on keflex.  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  Itchy.  Now with rash on the L hand, R abdominal wall and R anterior thigh.  No fevers.  He does not feel unwell otherwise.  Meds, vitals, and allergies reviewed.   ROS: Per HPI unless specifically indicated in ROS section   Nad [place] Neck supple, no LA Rrr Ctab Abdomen soft. He has diffuse blanching well demarcated erythematous rash on the right wrist left hand right side of the abdominal wall and right anterior thigh.  No ulceration.

## 2020-11-13 NOTE — Patient Instructions (Signed)
Prednisone with food.  Update me as needed.   Take care.  Glad to see you.

## 2020-11-17 NOTE — Assessment & Plan Note (Signed)
He did not improve with Keflex and now he has new rash in other areas.  We talked about his situation.  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  Start prednisone with routine steroid cautions and he will update me as needed.  This does not appear to be an infectious cellulitis.

## 2021-02-24 ENCOUNTER — Other Ambulatory Visit: Payer: Self-pay

## 2021-02-26 HISTORY — PX: WISDOM TOOTH EXTRACTION: SHX21

## 2021-02-27 NOTE — Progress Notes (Signed)
Subjective:   Victor Ball is a 81 y.o. male who presents for Medicare Annual/Subsequent preventive examination.  I connected with Barnetta Chapel  today by telephone and verified that I am speaking with the correct person using two identifiers. Location patient: home Location provider: work Persons participating in the virtual visit: patient, Marine scientist.    I discussed the limitations, risks, security and privacy concerns of performing an evaluation and management service by telephone and the availability of in person appointments. I also discussed with the patient that there may be a patient responsible charge related to this service. The patient expressed understanding and verbally consented to this telephonic visit.    Interactive audio and video telecommunications were attempted between this provider and patient, however failed, due to patient having technical difficulties OR patient did not have access to video capability.  We continued and completed visit with audio only.  Some vital signs may be absent or patient reported.   Time Spent with patient on telephone encounter: 25 minutes  Review of Systems     Cardiac Risk Factors include: advanced age (>90men, >93 women);hypertension;dyslipidemia     Objective:    Today's Vitals   03/02/21 1158  Weight: 194 lb (88 kg)  Height: 6\' 2"  (1.88 m)   Body mass index is 24.91 kg/m.  Advanced Directives 03/02/2021 10/30/2020 02/29/2020 09/10/2019 06/04/2019 02/27/2019 02/20/2018  Does Patient Have a Medical Advance Directive? Yes Yes Yes Yes Yes Yes Yes  Type of Advance Directive Living will;Healthcare Power of [place];Living will [place];Living will [place];Living will Living will;Healthcare Power of [place];Living will [place];Living will  Does patient want to make changes to medical advance directive? Yes  (MAU/Ambulatory/Procedural Areas - Information given) - - - - - -  Copy of Healthcare Power of Attorney in Chart? - - No - copy requested Yes - validated most recent copy scanned in chart (See row information) - Yes - validated most recent copy scanned in chart (See row information) Yes - validated most recent copy scanned in chart (See row information)  Would patient like information on creating a medical advance directive? - - - - - - -    Current Medications (verified) Outpatient Encounter Medications as of 03/02/2021  Medication Sig   atorvastatin (LIPITOR) 10 MG tablet Take 1 tablet (10 mg total) by mouth daily.   lisinopril (ZESTRIL) 5 MG tablet Take 1 tablet (5 mg total) by mouth daily.   FLUZONE HIGH-DOSE QUADRIVALENT 0.7 ML SUSY    omeprazole (PRILOSEC OTC) 20 MG tablet Take 1 tablet (20 mg total) by mouth daily.   PFIZER COVID-19 VAC BIVALENT injection    predniSONE (DELTASONE) 10 MG tablet Take 2 a day for 5 days, then 1 a day for 5 days, with food. Don't take with aleve/ibuprofen.   No facility-administered encounter medications on file as of 03/02/2021.    Allergies (verified) Patient has no known allergies.   History: Past Medical History:  Diagnosis Date   Duodenal adenoma    duodenal nodule ( copied from order for consent)   Hepatitis C, chronic ([place])    treated prev with Harvoni, tested and cleared   History of kidney stones    passed stone   Hyperlipidemia    Hypertension    Wears glasses    Past Surgical History:  Procedure Laterality Date   [place]   BIOPSY  10/30/2020   Procedure:  BIOPSY;  Surgeon: Irving Copas., MD;  Location: [facility];  Service: Gastroenterology;;   ENDOSCOPIC MUCOSAL RESECTION N/A 09/10/2019   Procedure: ENDOSCOPIC MUCOSAL RESECTION;  Surgeon: Irving Copas., MD;  Location: [place];  Service: Gastroenterology;  Laterality: N/A;   ENTEROSCOPY N/A 10/30/2020   Procedure: ENTEROSCOPY;  Surgeon:  Rush Landmark Telford Nab., MD;  Location: [facility];  Service: Gastroenterology;  Laterality: N/A;   ESOPHAGEAL DILATION  09/10/2019   Procedure: ESOPHAGEAL DILATION;  Surgeon: Rush Landmark Telford Nab., MD;  Location: [facility] [place];  Service: Gastroenterology;;   ESOPHAGOGASTRODUODENOSCOPY (EGD) WITH PROPOFOL N/A 06/04/2019   Procedure: ESOPHAGOGASTRODUODENOSCOPY (EGD) WITH PROPOFOL;  Surgeon: Virgel Manifold, MD;  Location: [facility];  Service: Endoscopy;  Laterality: N/A;   ESOPHAGOGASTRODUODENOSCOPY (EGD) WITH PROPOFOL N/A 09/10/2019   Procedure: ESOPHAGOGASTRODUODENOSCOPY (EGD) WITH PROPOFOL;  Surgeon: Rush Landmark Telford Nab., MD;  Location: [place];  Service: Gastroenterology;  Laterality: N/A;   HEMOSTASIS CLIP PLACEMENT  09/10/2019   Procedure: HEMOSTASIS CLIP PLACEMENT;  Surgeon: Irving Copas., MD;  Location: [place];  Service: Gastroenterology;;   LIVER BIOPSY     POLYPECTOMY  09/10/2019   Procedure: POLYPECTOMY;  Surgeon: Irving Copas., MD;  Location: [facility];  Service: Gastroenterology;;   POLYPECTOMY  10/30/2020   Procedure: POLYPECTOMY;  Surgeon: Irving Copas., MD;  Location: [place];  Service: Gastroenterology;;   PROSTATE BIOPSY     neg, x2   SUBMUCOSAL LIFTING INJECTION  09/10/2019   Procedure: SUBMUCOSAL LIFTING INJECTION;  Surgeon: Irving Copas., MD;  Location: [place];  Service: Gastroenterology;;   SUBMUCOSAL TATTOO INJECTION  09/10/2019   Procedure: SUBMUCOSAL TATTOO INJECTION;  Surgeon: Irving Copas., MD;  Location: [facility];  Service: Gastroenterology;;   teeth extractions     TOE FUSION  1985   left great toe   TONSILLECTOMY AND ADENOIDECTOMY  1945   TOTAL HIP ARTHROPLASTY Left 11/09/2016   Procedure: LEFT TOTAL HIP ARTHROPLASTY ANTERIOR APPROACH;  Surgeon: Mcarthur Rossetti, MD;  Location: [facility];  Service: Orthopedics;  Laterality: Left;   TOTAL HIP ARTHROPLASTY Right  09/13/2017   Procedure: RIGHT TOTAL HIP ARTHROPLASTY ANTERIOR APPROACH;  Surgeon: Mcarthur Rossetti, MD;  Location: [facility];  Service: Orthopedics;  Laterality: Right;   UPPER ESOPHAGEAL ENDOSCOPIC ULTRASOUND (EUS) N/A 09/10/2019   Procedure: UPPER ESOPHAGEAL ENDOSCOPIC ULTRASOUND (EUS);  Surgeon: Irving Copas., MD;  Location: [place];  Service: Gastroenterology;  Laterality: N/A;   VASECTOMY     WISDOM TOOTH EXTRACTION Right 02/26/2021   Family History  Problem Relation Age of Onset   Heart disease Father    Diabetes Father    Heart attack Father    Celiac disease Sister    Heart disease Brother        MI then stents   Heart attack Brother    Stroke Brother    Cancer Paternal Aunt    Prostate cancer Neg Hx    Colon cancer Neg Hx    Esophageal cancer Neg Hx    Inflammatory bowel disease Neg Hx    Liver disease Neg Hx    Pancreatic cancer Neg Hx    Rectal cancer Neg Hx    Stomach cancer Neg Hx    Social History   Socioeconomic History   Marital status: Married    Spouse name: Not on file   Number of children: 2   Years of education: Not on file   Highest education level: Not on file  Occupational History   Occupation: Insurance agent---retired  Comment: Hospital malpractice and worker's comp  Tobacco Use   Smoking status: Former    Types: Cigarettes    Quit date: 01/18/1973    Years since quitting: 48.1   Smokeless tobacco: Never  Vaping Use   Vaping Use: Never used  Substance and Sexual Activity   Alcohol use: Yes    Alcohol/week: 7.0 standard drinks    Types: 4 Glasses of wine, 3 Standard drinks or equivalent per week    Comment: 1 drink x 7 days - either wine or cocktail   Drug use: No   Sexual activity: Yes  Other Topics Concern   Not on file  Social History Narrative   2 daughters   Married 1961   Partially retired from Insurance underwriter, still consulting some as of 2022      Has living will   Wife, then daughters equally if wife were  incapacitated, has health care POA.   Would accept resuscitation attempts   Not sure about tube feeds   Social Determinants of Health   Financial Resource Strain: Low Risk    Difficulty of Paying Living Expenses: Not hard at all  Food Insecurity: No Food Insecurity   Worried About Charity fundraiser in the Last Year: Never true   [place] in the Last Year: Never true  Transportation Needs: No Transportation Needs   Lack of Transportation (Medical): No   Lack of Transportation (Non-Medical): No  Physical Activity: Sufficiently Active   Days of Exercise per Week: 4 days   Minutes of Exercise per Session: 120 min  Stress: No Stress Concern Present   Feeling of Stress : Not at all  Social Connections: Socially Integrated   Frequency of Communication with Friends and Family: More than three times a week   Frequency of Social Gatherings with Friends and Family: More than three times a week   Attends Religious Services: More than 4 times per year   Active Member of [employer] or Organizations: Yes   Attends Music therapist: More than 4 times per year   Marital Status: Married    Tobacco Counseling Counseling given: Not Answered   Clinical Intake:  Pre-visit preparation completed: No  Pain : No/denies pain     BMI - recorded: 24.91 Nutritional Status: BMI of 19-24  Normal Nutritional Risks: None Diabetes: No  How often do you need to have someone help you when you read instructions, pamphlets, or other written materials from your doctor or pharmacy?: 1 - Never  Diabetic? No  Interpreter Needed?: No  Information entered by :: Orrin Brigham LPN   Activities of Daily Living In your present state of health, do you have any difficulty performing the following activities: 03/02/2021  Hearing? N  Vision? N  Difficulty concentrating or making decisions? N  Walking or climbing stairs? N  Dressing or bathing? N  Doing errands, shopping? N  Preparing Food  and eating ? N  Using the Toilet? N  In the past six months, have you accidently leaked urine? N  Do you have problems with loss of bowel control? N  Managing your Medications? N  Managing your Finances? N  Housekeeping or managing your Housekeeping? N  Some recent data might be hidden    Patient Care Team: Tonia Ghent, MD as PCP - General (Family Medicine) Mcarthur Rossetti, MD as Consulting Physician (Orthopedic Surgery) Eugenie Birks Paschal Dopp., MD as Referring Physician (Dentistry) Ralene Bathe, MD as Referring Physician (Dermatology)  Indicate  any recent Medical Services you may have received from other than [facility] providers in the past year (date may be approximate).     Assessment:   This is a routine wellness examination for Eljay.  Hearing/Vision screen Hearing Screening - Comments:: No issues  Vision Screening - Comments:: Last exam 02/2020, has an upcoming appointment 03/05/21,  Dr.  Joseph Art , wears glasses  Dietary issues and exercise activities discussed: Current Exercise Habits: Home exercise routine;The patient has a physically strenuous job, but has no regular exercise apart from work. (works on making trails), Type of exercise: Other - see comments (pickle ball, tennis , golf), Time (Minutes): 60 (60+ hours), Frequency (Times/Week): 4, Weekly Exercise (Minutes/Week): 240, Intensity: Moderate   Goals Addressed             This Visit's Progress    Patient Stated       Would like to maintain current routine        Depression Screen PHQ 2/9 Scores 03/02/2021 02/29/2020 02/27/2019 02/20/2018 02/18/2017 02/17/2016 02/17/2015  PHQ - 2 Score 0 0 0 0 0 0 0  PHQ- 9 Score - 0 0 0 0 - -    Fall Risk Fall Risk  03/02/2021 02/29/2020 02/27/2019 02/20/2018 02/18/2017  Falls in the past year? 0 0 0 0 No  Number falls in past yr: 0 0 0 - -  Injury with Fall? 0 0 0 - -  Risk for fall due to : No Fall Risks No Fall Risks No Fall Risks - -  Follow up Falls prevention  discussed Falls evaluation completed;Falls prevention discussed Falls evaluation completed;Falls prevention discussed - -    FALL RISK PREVENTION PERTAINING TO THE HOME:  Any stairs in or around the home? No  If so, are there any without handrails? No  Home free of loose throw rugs in walkways, pet beds, electrical cords, etc? Yes  Adequate lighting in your home to reduce risk of falls? Yes   ASSISTIVE DEVICES UTILIZED TO PREVENT FALLS:  Life alert? No  Use of a cane, walker or w/c? No  Grab bars in the bathroom? No  Shower chair or bench in shower? Yes  Elevated toilet seat or a handicapped toilet? Yes   TIMED UP AND GO:  Was the test performed? No .    Cognitive Function: Normal cognitive status assessed by this Nurse Health Advisor. No abnormalities found.   MMSE - Mini Mental State Exam 02/29/2020 02/27/2019 02/20/2018 02/18/2017 02/17/2016  Orientation to time 5 5 5 5 5   Orientation to Place 5 5 5 5 5   Registration 3 3 3 3 3   Attention/ Calculation 5 5 0 0 0  Recall 3 3 3 3 3   Language- name 2 objects - - 0 0 0  Language- repeat 1 1 1 1 1   Language- follow 3 step command - - 3 3 3   Language- read & follow direction - - 0 0 0  Write a sentence - - 0 0 0  Copy design - - 0 0 0  Total score - - 20 20 20         Immunizations Immunization History  Administered Date(s) Administered   Fluad Quad(high Dose 65+) 09/15/2018   Influenza Split 10/06/2011   Influenza,inj,Quad PF,6+ Mos 10/04/2012, 12/28/2013, 12/11/2014, 12/10/2015, 10/11/2016, 10/27/2017   Influenza-Unspecified 11/04/2008, 12/02/2010, 10/24/2019, 10/19/2020   PFIZER(Purple Top)SARS-COV-2 Vaccination 01/29/2019, 02/19/2019, 10/24/2019, 05/19/2020   Pneumococcal Conjugate-13 08/15/2014   Pneumococcal Polysaccharide-23 01/19/2003, 07/15/2003, 06/16/2012   Td 07/15/2003, 10/24/2012  Tdap 01/19/2003   Zoster Recombinat (Shingrix) 08/03/2019, 11/25/2019   Zoster, Live 01/19/2007, 10/25/2007    TDAP status: Up  to date  Flu Vaccine status: Up to date  Pneumococcal vaccine status: Up to date  Covid-19 vaccine status: Completed vaccines  Qualifies for Shingles Vaccine? Yes   Zostavax completed Yes   Shingrix Completed?: Yes  Screening Tests Health Maintenance  Topic Date Due   COVID-19 Vaccine (5 - Booster for Pfizer series) 07/14/2020   TETANUS/TDAP  10/25/2022   Pneumonia Vaccine 57+ Years old  Completed   INFLUENZA VACCINE  Completed   Zoster Vaccines- Shingrix  Completed   HPV VACCINES  Aged Out    Health Maintenance  Health Maintenance Due  Topic Date Due   COVID-19 Vaccine (5 - Booster for [place] series) 07/14/2020    Colorectal cancer screening: No longer required.   Lung Cancer Screening: (Low Dose CT Chest recommended if Age 17-80 years, 30 pack-year currently smoking OR have quit w/in 15years.) does not qualify.     Additional Screening:  Hepatitis C Screening: does not qualify  Vision Screening: Recommended annual ophthalmology exams for early detection of glaucoma and other disorders of the eye. Is the patient up to date with their annual eye exam?  Yes  Who is the provider or what is the name of the office in which the patient attends annual eye exams? Dr. Vallery Ridge   Dental Screening: Recommended annual dental exams for proper oral hygiene  Community Resource Referral / Chronic Care Management: CRR required this visit?  No   CCM required this visit?  No      Plan:     I have personally reviewed and noted the following in the patients chart:   Medical and social history Use of alcohol, tobacco or illicit drugs  Current medications and supplements including opioid prescriptions. Patient is not currently taking opioid prescriptions. Functional ability and status Nutritional status Physical activity Advanced directives List of other physicians Hospitalizations, surgeries, and ER visits in previous 12 months Vitals Screenings to include  cognitive, depression, and falls Referrals and appointments  In addition, I have reviewed and discussed with patient certain preventive protocols, quality metrics, and best practice recommendations. A written personalized care plan for preventive services as well as general preventive health recommendations were provided to patient.   Due to this being a telephonic visit, the after visit summary with patients personalized plan was offered to patient via mail or my-chart. Patient would like to access on my-chart.   Loma Messing, LPN   [id number]   Nurse Health Advisor  Nurse Notes: none

## 2021-03-02 ENCOUNTER — Other Ambulatory Visit: Payer: Medicare Other

## 2021-03-02 ENCOUNTER — Ambulatory Visit (INDEPENDENT_AMBULATORY_CARE_PROVIDER_SITE_OTHER): Payer: Medicare Other

## 2021-03-02 VITALS — Ht 74.0 in | Wt 194.0 lb

## 2021-03-02 DIAGNOSIS — Z0000 Encounter for general adult medical examination without abnormal findings: Secondary | ICD-10-CM

## 2021-03-02 NOTE — Patient Instructions (Addendum)
[name] , Thank you for taking time to complete your Medicare Wellness Visit. I appreciate your ongoing commitment to your health goals. Please review the following plan we discussed and let me know if I can assist you in the future.   Screening recommendations/referrals: Colonoscopy: no longer required  Recommended yearly ophthalmology/optometry visit for glaucoma screening and checkup Recommended yearly dental visit for hygiene and checkup  Vaccinations: Influenza vaccine: up to date Pneumococcal vaccine: up to date  Tdap vaccine: up to date, completed 10/24/12, due 10/25/22 Shingles vaccine: up to date    Covid-19: up to date  Advanced directives: Please bring a copy of Living Will and/or [place] for your chart.   Conditions/risks identified: see problem list  Next appointment: Follow up in one year for your annual wellness visit. 03/03/22 @ 8:15am, this will be a telephone visit   Preventive Care 21 Years and Older, Male Preventive care refers to lifestyle choices and visits with your health care provider that can promote health and wellness. What does preventive care include? A yearly physical exam. This is also called an annual well check. Dental exams once or twice a year. Routine eye exams. Ask your health care provider how often you should have your eyes checked. Personal lifestyle choices, including: Daily care of your teeth and gums. Regular physical activity. Eating a healthy diet. Avoiding tobacco and drug use. Limiting alcohol use. Practicing safe sex. Taking low doses of aspirin every day. Taking vitamin and mineral supplements as recommended by your health care provider. What happens during an annual well check? The services and screenings done by your health care provider during your annual well check will depend on your age, overall health, lifestyle risk factors, and family history of disease. Counseling  Your health care provider may ask  you questions about your: Alcohol use. Tobacco use. Drug use. Emotional well-being. Home and relationship well-being. Sexual activity. Eating habits. History of falls. Memory and ability to understand (cognition). Work and work Statistician. Screening  You may have the following tests or measurements: Height, weight, and BMI. Blood pressure. Lipid and cholesterol levels. These may be checked every 5 years, or more frequently if you are over 34 years old. Skin check. Lung cancer screening. You may have this screening every year starting at age 84 if you have a 30-pack-year history of smoking and currently smoke or have quit within the past 15 years. Fecal occult blood test (FOBT) of the stool. You may have this test every year starting at age 50. Flexible sigmoidoscopy or colonoscopy. You may have a sigmoidoscopy every 5 years or a colonoscopy every 10 years starting at age 25. Prostate cancer screening. Recommendations will vary depending on your family history and other risks. Hepatitis C blood test. Hepatitis B blood test. Sexually transmitted disease (STD) testing. Diabetes screening. This is done by checking your blood sugar (glucose) after you have not eaten for a while (fasting). You may have this done every 1-3 years. Abdominal aortic aneurysm (AAA) screening. You may need this if you are a current or former smoker. Osteoporosis. You may be screened starting at age 52 if you are at high risk. Talk with your health care provider about your test results, treatment options, and if necessary, the need for more tests. Vaccines  Your health care provider may recommend certain vaccines, such as: Influenza vaccine. This is recommended every year. Tetanus, diphtheria, and acellular pertussis (Tdap, Td) vaccine. You may need a Td booster every 10 years.  Zoster vaccine. You may need this after age 55. Pneumococcal 13-valent conjugate (PCV13) vaccine. One dose is recommended after age  68. Pneumococcal polysaccharide (PPSV23) vaccine. One dose is recommended after age 12. Talk to your health care provider about which screenings and vaccines you need and how often you need them. This information is not intended to replace advice given to you by your health care provider. Make sure you discuss any questions you have with your health care provider. Document Released: 01/31/2015 Document Revised: 09/24/2015 Document Reviewed: 11/05/2014 Elsevier Interactive Patient Education  2017 [employer] Prevention in the Home Falls can cause injuries. They can happen to people of all ages. There are many things you can do to make your home safe and to help prevent falls. What can I do on the outside of my home? Regularly fix the edges of walkways and driveways and fix any cracks. Remove anything that might make you trip as you walk through a door, such as a raised step or threshold. Trim any bushes or trees on the path to your home. Use bright outdoor lighting. Clear any walking paths of anything that might make someone trip, such as rocks or tools. Regularly check to see if handrails are loose or broken. Make sure that both sides of any steps have handrails. Any raised decks and porches should have guardrails on the edges. Have any leaves, snow, or ice cleared regularly. Use sand or salt on walking paths during winter. Clean up any spills in your garage right away. This includes oil or grease spills. What can I do in the bathroom? Use night lights. Install grab bars by the toilet and in the tub and shower. Do not use towel bars as grab bars. Use non-skid mats or decals in the tub or shower. If you need to sit down in the shower, use a plastic, non-slip stool. Keep the floor dry. Clean up any water that spills on the floor as soon as it happens. Remove soap buildup in the tub or shower regularly. Attach bath mats securely with double-sided non-slip rug tape. Do not have throw  rugs and other things on the floor that can make you trip. What can I do in the bedroom? Use night lights. Make sure that you have a light by your bed that is easy to reach. Do not use any sheets or blankets that are too big for your bed. They should not hang down onto the floor. Have a firm chair that has side arms. You can use this for support while you get dressed. Do not have throw rugs and other things on the floor that can make you trip. What can I do in the kitchen? Clean up any spills right away. Avoid walking on wet floors. Keep items that you use a lot in easy-to-reach places. If you need to reach something above you, use a strong step stool that has a grab bar. Keep electrical cords out of the way. Do not use floor polish or wax that makes floors slippery. If you must use wax, use non-skid floor wax. Do not have throw rugs and other things on the floor that can make you trip. What can I do with my stairs? Do not leave any items on the stairs. Make sure that there are handrails on both sides of the stairs and use them. Fix handrails that are broken or loose. Make sure that handrails are as long as the stairways. Check any carpeting to make sure that  it is firmly attached to the stairs. Fix any carpet that is loose or worn. Avoid having throw rugs at the top or bottom of the stairs. If you do have throw rugs, attach them to the floor with carpet tape. Make sure that you have a light switch at the top of the stairs and the bottom of the stairs. If you do not have them, ask someone to add them for you. What else can I do to help prevent falls? Wear shoes that: Do not have high heels. Have rubber bottoms. Are comfortable and fit you well. Are closed at the toe. Do not wear sandals. If you use a stepladder: Make sure that it is fully opened. Do not climb a closed stepladder. Make sure that both sides of the stepladder are locked into place. Ask someone to hold it for you, if  possible. Clearly mark and make sure that you can see: Any grab bars or handrails. First and last steps. Where the edge of each step is. Use tools that help you move around (mobility aids) if they are needed. These include: Canes. Walkers. Scooters. Crutches. Turn on the lights when you go into a dark area. Replace any light bulbs as soon as they burn out. Set up your furniture so you have a clear path. Avoid moving your furniture around. If any of your floors are uneven, fix them. If there are any pets around you, be aware of where they are. Review your medicines with your doctor. Some medicines can make you feel dizzy. This can increase your chance of falling. Ask your doctor what other things that you can do to help prevent falls. This information is not intended to replace advice given to you by your health care provider. Make sure you discuss any questions you have with your health care provider. Document Released: 10/31/2008 Document Revised: 06/12/2015 Document Reviewed: 02/08/2014 Elsevier Interactive Patient Education  2017 [employer].

## 2021-03-05 ENCOUNTER — Ambulatory Visit: Payer: Medicare Other | Admitting: Podiatry

## 2021-03-05 DIAGNOSIS — H52223 Regular astigmatism, bilateral: Secondary | ICD-10-CM | POA: Diagnosis not present

## 2021-03-05 DIAGNOSIS — H524 Presbyopia: Secondary | ICD-10-CM | POA: Diagnosis not present

## 2021-03-05 DIAGNOSIS — H5203 Hypermetropia, bilateral: Secondary | ICD-10-CM | POA: Diagnosis not present

## 2021-03-05 DIAGNOSIS — H2513 Age-related nuclear cataract, bilateral: Secondary | ICD-10-CM | POA: Diagnosis not present

## 2021-03-16 ENCOUNTER — Encounter: Payer: Medicare Other | Admitting: Family Medicine

## 2021-04-12 ENCOUNTER — Other Ambulatory Visit: Payer: Self-pay | Admitting: Family Medicine

## 2021-04-12 DIAGNOSIS — E782 Mixed hyperlipidemia: Secondary | ICD-10-CM

## 2021-04-12 DIAGNOSIS — Z125 Encounter for screening for malignant neoplasm of prostate: Secondary | ICD-10-CM

## 2021-04-12 DIAGNOSIS — R972 Elevated prostate specific antigen [PSA]: Secondary | ICD-10-CM

## 2021-04-15 ENCOUNTER — Other Ambulatory Visit: Payer: Self-pay

## 2021-04-15 ENCOUNTER — Other Ambulatory Visit (INDEPENDENT_AMBULATORY_CARE_PROVIDER_SITE_OTHER): Payer: Medicare Other

## 2021-04-15 DIAGNOSIS — R972 Elevated prostate specific antigen [PSA]: Secondary | ICD-10-CM

## 2021-04-15 DIAGNOSIS — E782 Mixed hyperlipidemia: Secondary | ICD-10-CM | POA: Diagnosis not present

## 2021-04-15 DIAGNOSIS — Z125 Encounter for screening for malignant neoplasm of prostate: Secondary | ICD-10-CM

## 2021-04-15 LAB — COMPREHENSIVE METABOLIC PANEL
ALT: 11 U/L (ref 0–53)
AST: 14 U/L (ref 0–37)
Albumin: 4.2 g/dL (ref 3.5–5.2)
Alkaline Phosphatase: 68 U/L (ref 39–117)
BUN: 21 mg/dL (ref 6–23)
CO2: 31 mEq/L (ref 19–32)
Calcium: 9.3 mg/dL (ref 8.4–10.5)
Chloride: 107 mEq/L (ref 96–112)
Creatinine, Ser: 0.89 mg/dL (ref 0.40–1.50)
GFR: 79.41 mL/min (ref >60.00)
Glucose, Bld: 107 mg/dL — ABNORMAL HIGH (ref 70–99)
Potassium: 5 mEq/L (ref 3.5–5.1)
Sodium: 140 mEq/L (ref 135–145)
Total Bilirubin: 0.6 mg/dL (ref 0.2–1.2)
Total Protein: 6.4 g/dL (ref 6.0–8.3)

## 2021-04-15 LAB — LIPID PANEL
Cholesterol: 126 mg/dL (ref 0–200)
HDL: 50.3 mg/dL (ref >39.00)
LDL Cholesterol: 63 mg/dL (ref 0–99)
NonHDL: 75.34
Total CHOL/HDL Ratio: 2
Triglycerides: 64 mg/dL (ref 0.0–149.0)
VLDL: 12.8 mg/dL (ref 0.0–40.0)

## 2021-04-15 LAB — PSA, MEDICARE: PSA: 9.94 ng/ml — ABNORMAL HIGH (ref 0.10–4.00)

## 2021-04-17 ENCOUNTER — Encounter: Payer: Medicare Other | Admitting: Family Medicine

## 2021-05-05 ENCOUNTER — Encounter: Payer: Self-pay | Admitting: Family Medicine

## 2021-05-05 ENCOUNTER — Ambulatory Visit (INDEPENDENT_AMBULATORY_CARE_PROVIDER_SITE_OTHER): Payer: Medicare Other | Admitting: Family Medicine

## 2021-05-05 VITALS — BP 136/72 | HR 58 | Temp 98.2°F | Ht 74.0 in | Wt 204.0 lb

## 2021-05-05 DIAGNOSIS — R972 Elevated prostate specific antigen [PSA]: Secondary | ICD-10-CM

## 2021-05-05 DIAGNOSIS — Z0000 Encounter for general adult medical examination without abnormal findings: Secondary | ICD-10-CM

## 2021-05-05 DIAGNOSIS — I10 Essential (primary) hypertension: Secondary | ICD-10-CM

## 2021-05-05 DIAGNOSIS — E782 Mixed hyperlipidemia: Secondary | ICD-10-CM | POA: Diagnosis not present

## 2021-05-05 DIAGNOSIS — Z7189 Other specified counseling: Secondary | ICD-10-CM

## 2021-05-05 MED ORDER — LISINOPRIL 5 MG PO TABS: 5.0000 mg | ORAL_TABLET | Freq: Every day | ORAL | 3 refills | Status: DC

## 2021-05-05 MED ORDER — ATORVASTATIN CALCIUM 10 MG PO TABS: 10.0000 mg | ORAL_TABLET | Freq: Every day | ORAL | 3 refills | Status: DC

## 2021-05-05 NOTE — Patient Instructions (Signed)
Take care.  Glad to see you. Thanks for your effort.   Update me as needed.   

## 2021-05-05 NOTE — Progress Notes (Signed)
Hypertension:    ?Using medication without problems or lightheadedness: yes ?Chest pain with exertion:no ?Edema:no ?Short of breath:no ? ?Elevated Cholesterol: ?Using medications without problems: yes ?Muscle aches: no ?Diet compliance: yes ?Exercise: yes ?H/o intentional weight loss.  He is working on diet and exercise.  He is still hiking and clearing trails.  He is playing tennis.   ? ?His sister died this year, d/w pt.  Condolences offered.   ? ?He had EGD done 2022 with no planned f/u needed.   ? ?Living will d/w pt.  Wife designated, then daughters equally if wife were incapacitated, if patient were incapacitated.   ?Tetanus 2014 ?PNA up to date ?Flu 2022 ?Shingrix done.    ?[place], [place], 2021.   ?H/o neg prostate biopsy x2.  PSA similar to prev.  I will update urology.  He has nocturia x1.  He'll update me if sx become sig enough to tx.   ?Would defer colon CA screening 2023, d/w pt.  ? ?Meds, vitals, and allergies reviewed.  ? ?PMH and SH reviewed ? ?ROS: Per HPI unless specifically indicated in ROS section  ? ?GEN: nad, alert and oriented ?HEENT: ncat ?NECK: supple w/o LA ?CV: rrr. ?PULM: ctab, no inc wob ?ABD: soft, +bs ?EXT: no edema ?SKIN: no acute rash ? ?30 minutes were devoted to patient care in this encounter (this includes time spent reviewing the patient's file/history, interviewing and examining the patient, counseling/reviewing plan with patient).  ?

## 2021-05-07 ENCOUNTER — Telehealth: Payer: Self-pay | Admitting: Family Medicine

## 2021-05-07 NOTE — Assessment & Plan Note (Signed)
Living will d/w pt.  Wife designated, then daughters equally if wife were incapacitated, if patient were incapacitated.   ?

## 2021-05-07 NOTE — Assessment & Plan Note (Signed)
Labs discussed with patient.  Continue work on diet and exercise.  Continue atorvastatin. ?

## 2021-05-07 NOTE — Telephone Encounter (Signed)
FYI, PSA was similar to previous baseline and given his history of negative biopsy and nonbothersome symptoms we did not intervene.  I told him I would update you. ?

## 2021-05-07 NOTE — Telephone Encounter (Signed)
Hollice Espy, MD  You Just now (10:35 AM)  ? ?Totally agree.  Thanks for heads up.  ? ?Hollice Espy, MD   ? ?

## 2021-05-07 NOTE — Assessment & Plan Note (Signed)
Living will d/w pt.  Wife designated, then daughters equally if wife were incapacitated, if patient were incapacitated.   ?Tetanus 2014 ?PNA up to date ?Flu 2022 ?Shingrix done.    ?[place], [place], 2021.   ?H/o neg prostate biopsy x2.  PSA similar to prev.  I will update urology.  He has nocturia x1.  He'll update me if sx become sig enough to tx.   ?Would defer colon CA screening 2023, d/w pt.  ?

## 2021-05-07 NOTE — Assessment & Plan Note (Signed)
H/o neg prostate biopsy x2.  PSA similar to prev.  I will update urology.  He has nocturia x1.  He'll update me if sx become sig enough to tx.   ?

## 2021-05-07 NOTE — Assessment & Plan Note (Signed)
Labs discussed with patient.  Continue work on diet and exercise.  Continue lisinopril. ?

## 2021-06-05 ENCOUNTER — Other Ambulatory Visit: Payer: Self-pay | Admitting: Family Medicine

## 2021-06-05 DIAGNOSIS — I10 Essential (primary) hypertension: Secondary | ICD-10-CM

## 2021-06-11 ENCOUNTER — Ambulatory Visit: Payer: Medicare Other | Admitting: Podiatry

## 2021-10-13 ENCOUNTER — Ambulatory Visit: Payer: Medicare Other | Admitting: Podiatry

## 2021-10-28 ENCOUNTER — Ambulatory Visit: Payer: Medicare Other | Admitting: Dermatology

## 2021-10-28 DIAGNOSIS — L814 Other melanin hyperpigmentation: Secondary | ICD-10-CM | POA: Diagnosis not present

## 2021-10-28 DIAGNOSIS — D692 Other nonthrombocytopenic purpura: Secondary | ICD-10-CM | POA: Diagnosis not present

## 2021-10-28 DIAGNOSIS — D1801 Hemangioma of skin and subcutaneous tissue: Secondary | ICD-10-CM

## 2021-10-28 DIAGNOSIS — D229 Melanocytic nevi, unspecified: Secondary | ICD-10-CM

## 2021-10-28 DIAGNOSIS — Z1283 Encounter for screening for malignant neoplasm of skin: Secondary | ICD-10-CM | POA: Diagnosis not present

## 2021-10-28 DIAGNOSIS — L570 Actinic keratosis: Secondary | ICD-10-CM

## 2021-10-28 DIAGNOSIS — L578 Other skin changes due to chronic exposure to nonionizing radiation: Secondary | ICD-10-CM

## 2021-10-28 NOTE — Patient Instructions (Signed)
Due to recent changes in healthcare laws, you may see results of your pathology and/or laboratory studies on MyChart before the doctors have had a chance to review them. We understand that in some cases there may be results that are confusing or concerning to you. Please understand that not all results are received at the same time and often the doctors may need to interpret multiple results in order to provide you with the best plan of care or course of treatment. Therefore, we ask that you please give us 2 business days to thoroughly review all your results before contacting the office for clarification. Should we see a critical lab result, you will be contacted sooner.   If You Need Anything After Your Visit  If you have any questions or concerns for your doctor, please call our main line at [phone] and press option 4 to reach your doctor's medical assistant. If no one answers, please leave a voicemail as directed and we will return your call as soon as possible. Messages left after 4 pm will be answered the following business day.   You may also send us a message via MyChart. We typically respond to MyChart messages within 1-2 business days.  For prescription refills, please ask your pharmacy to contact our office. Our fax number is [phone].  If you have an urgent issue when the clinic is closed that cannot wait until the next business day, you can page your doctor at the number below.    Please note that while we do our best to be available for urgent issues outside of office hours, we are not available 24/7.   If you have an urgent issue and are unable to reach us, you may choose to seek medical care at your doctor's office, retail clinic, urgent care center, or emergency room.  If you have a medical emergency, please immediately call 911 or go to the emergency department.  Pager Numbers  - Dr. Kowalski: [phone]  - Dr. Moye: [phone]  - Dr. Stewart:  [phone]  In the event of inclement weather, please call our main line at [phone] for an update on the status of any delays or closures.  Dermatology Medication Tips: Please keep the boxes that topical medications come in in order to help keep track of the instructions about where and how to use these. Pharmacies typically print the medication instructions only on the boxes and not directly on the medication tubes.   If your medication is too expensive, please contact our office at [phone] option 4 or send us a message through MyChart.   We are unable to tell what your co-pay for medications will be in advance as this is different depending on your insurance coverage. However, we may be able to find a substitute medication at lower cost or fill out paperwork to get insurance to cover a needed medication.   If a prior authorization is required to get your medication covered by your insurance company, please allow us 1-2 business days to complete this process.  Drug prices often vary depending on where the prescription is filled and some pharmacies may offer cheaper prices.  The website www.goodrx.com contains coupons for medications through different pharmacies. The prices here do not account for what the cost may be with help from insurance (it may be cheaper with your insurance), but the website can give you the price if you did not use any insurance.  - You can print the associated coupon and take it with   your prescription to the pharmacy.  - You may also stop by our office during regular business hours and pick up a GoodRx coupon card.  - If you need your prescription sent electronically to a different pharmacy, notify our office through [facility] MyChart or by phone at [phone] option 4.     Si Usted Necesita Algo Despus de Su Visita  Tambin puede enviarnos un mensaje a travs de MyChart. Por lo general respondemos a los mensajes de MyChart en el transcurso de 1 a 2  das hbiles.  Para renovar recetas, por favor pida a su farmacia que se ponga en contacto con nuestra oficina. Nuestro nmero de fax es el [phone].  Si tiene un asunto urgente cuando la clnica est cerrada y que no puede esperar hasta el siguiente da hbil, puede llamar/localizar a su doctor(a) al nmero que aparece a continuacin.   Por favor, tenga en cuenta que aunque hacemos todo lo posible para estar disponibles para asuntos urgentes fuera del horario de oficina, no estamos disponibles las 24 horas del da, los 7 das de la semana.   Si tiene un problema urgente y no puede comunicarse con nosotros, puede optar por buscar atencin mdica  en el consultorio de su doctor(a), en una clnica privada, en un centro de atencin urgente o en una sala de emergencias.  Si tiene una emergencia mdica, por favor llame inmediatamente al 911 o vaya a la sala de emergencias.  Nmeros de bper  - Dr. Kowalski: [phone]  - Dra. Moye: [phone]  - Dra. Stewart: [phone]  En caso de inclemencias del tiempo, por favor llame a nuestra lnea principal al [phone] para una actualizacin sobre el estado de cualquier retraso o cierre.  Consejos para la medicacin en dermatologa: Por favor, guarde las cajas en las que vienen los medicamentos de uso tpico para ayudarle a seguir las instrucciones sobre dnde y cmo usarlos. Las farmacias generalmente imprimen las instrucciones del medicamento slo en las cajas y no directamente en los tubos del medicamento.   Si su medicamento es muy caro, por favor, pngase en contacto con nuestra oficina llamando al [phone] y presione la opcin 4 o envenos un mensaje a travs de MyChart.   No podemos decirle cul ser su copago por los medicamentos por adelantado ya que esto es diferente dependiendo de la cobertura de su seguro. Sin embargo, es posible que podamos encontrar un medicamento sustituto a menor costo o llenar un formulario para que el  seguro cubra el medicamento que se considera necesario.   Si se requiere una autorizacin previa para que su compaa de seguros cubra su medicamento, por favor permtanos de 1 a 2 das hbiles para completar este proceso.  Los precios de los medicamentos varan con frecuencia dependiendo del lugar de dnde se surte la receta y alguna farmacias pueden ofrecer precios ms baratos.  El sitio web www.goodrx.com tiene cupones para medicamentos de diferentes farmacias. Los precios aqu no tienen en cuenta lo que podra costar con la ayuda del seguro (puede ser ms barato con su seguro), pero el sitio web puede darle el precio si no utiliz ningn seguro.  - Puede imprimir el cupn correspondiente y llevarlo con su receta a la farmacia.  - Tambin puede pasar por nuestra oficina durante el horario de atencin regular y recoger una tarjeta de cupones de GoodRx.  - Si necesita que su receta se enve electrnicamente a una farmacia diferente, informe a nuestra oficina a travs de MyChart de [facility]   o por telfono llamando al [phone] y presione la opcin 4.  

## 2021-10-28 NOTE — Progress Notes (Signed)
   Follow-Up Visit   Subjective  Victor Ball is a 81 y.o. male who presents for the following: Annual Exam (Hx Ak's ). The patient presents for Total-Body Skin Exam (TBSE) for skin cancer screening and mole check.  The patient has spots, moles and lesions to be evaluated, some may be new or changing and the patient has concerns that these could be cancer.  The following portions of the chart were reviewed this encounter and updated as appropriate:   Tobacco  Allergies  Meds  Problems  Med Hx  Surg Hx  Fam Hx     Review of Systems:  No other skin or systemic complaints except as noted in HPI or Assessment and Plan.  Objective  Well appearing patient in no apparent distress; mood and affect are within normal limits.  A full examination was performed including scalp, head, eyes, ears, nose, lips, neck, chest, axillae, abdomen, back, buttocks, bilateral upper extremities, bilateral lower extremities, hands, feet, fingers, toes, fingernails, and toenails. All findings within normal limits unless otherwise noted below.  R ear x 2 (2) Erythematous thin papules/macules with gritty scale.    Assessment & Plan  AK (actinic keratosis) (2) R ear x 2  Destruction of lesion - R ear x 2 Complexity: simple   Destruction method: cryotherapy   Informed consent: discussed and consent obtained   Timeout:  patient name, date of birth, surgical site, and procedure verified Lesion destroyed using liquid nitrogen: Yes   Region frozen until ice ball extended beyond lesion: Yes   Outcome: patient tolerated procedure well with no complications   Post-procedure details: wound care instructions given    Lentigines - Scattered tan macules - Due to sun exposure - Benign-appearing, observe - Recommend daily broad spectrum sunscreen SPF 30+ to sun-exposed areas, reapply every 2 hours as needed. - Call for any changes  Seborrheic Keratoses - Stuck-on, waxy, tan-brown papules and/or plaques  -  Benign-appearing - Discussed benign etiology and prognosis. - Observe - Call for any changes  Melanocytic Nevi - Tan-brown and/or pink-flesh-colored symmetric macules and papules - Benign appearing on exam today - Observation - Call clinic for new or changing moles - Recommend daily use of broad spectrum spf 30+ sunscreen to sun-exposed areas.   Hemangiomas - Red papules - Discussed benign nature - Observe - Call for any changes  Actinic Damage - Chronic condition, secondary to cumulative UV/sun exposure - diffuse scaly erythematous macules with underlying dyspigmentation - Recommend daily broad spectrum sunscreen SPF 30+ to sun-exposed areas, reapply every 2 hours as needed.  - Staying in the shade or wearing long sleeves, sun glasses (UVA+UVB protection) and wide brim hats (4-inch brim around the entire circumference of the hat) are also recommended for sun protection.  - Call for new or changing lesions.  Purpura - Chronic; persistent and recurrent.  Treatable, but not curable. - Violaceous macules and patches - Benign - Related to trauma, age, sun damage and/or use of blood thinners, chronic use of topical and/or oral steroids - Observe - Can use OTC arnica containing moisturizer such as Dermend Bruise Formula if desired - Call for worsening or other concerns  Skin cancer screening performed today.  Return in about 1 year (around 10/29/2022) for TBSE.  Luther Redo, CMA, am acting as scribe for Sarina Ser, MD . Documentation: I have reviewed the above documentation for accuracy and completeness, and I agree with the above.  Sarina Ser, MD

## 2021-11-02 ENCOUNTER — Telehealth (INDEPENDENT_AMBULATORY_CARE_PROVIDER_SITE_OTHER): Payer: Medicare Other | Admitting: Nurse Practitioner

## 2021-11-02 VITALS — Temp 98.8°F

## 2021-11-02 DIAGNOSIS — U071 COVID-19: Secondary | ICD-10-CM | POA: Insufficient documentation

## 2021-11-02 MED ORDER — NIRMATRELVIR/RITONAVIR (PAXLOVID)TABLET: 3.0000 | ORAL_TABLET | Freq: Two times a day (BID) | ORAL | 0 refills | Status: AC

## 2021-11-02 MED ORDER — NIRMATRELVIR/RITONAVIR (PAXLOVID)TABLET: 3.0000 | ORAL_TABLET | Freq: Two times a day (BID) | ORAL | 0 refills | Status: DC

## 2021-11-02 NOTE — Progress Notes (Signed)
Patient ID: Victor Ball, male    DOB: Jun 07, 1940, 81 y.o.   MRN: [id number]  Virtual visit completed through [place], a video enabled telemedicine application. Due to national recommendations of social distancing due to COVID-19, a virtual visit is felt to be most appropriate for this patient at this time. Reviewed limitations, risks, security and privacy concerns of performing a virtual visit and the availability of in person appointments. I also reviewed that there may be a patient responsible charge related to this service. The patient agreed to proceed.   Patient location: home Provider location: [place] at [facility], office Persons participating in this virtual visit: patient, provider   If any vitals were documented, they were collected by patient at home unless specified below.    Temp 98.8 F (37.1 C) Comment: per patient this morning   CC: Covid 19 Subjective:   HPI: Victor Ball is a 81 y.o. male presenting on 11/02/2021 for Covid Positive (On 11/01/21-sx started on 10/30/21- like a head cold sensation-drainage, head congestion. NO fever. )    Symptoms started on 10/30/2021 Test positive for covid on 11/01/2021 States that he flew to [place] on Thursday.  [place] home yesterday Was with his brother was positive for [employer] x2 and three booster He is taking cold and flu over the counter. No relief   Relevant past medical, surgical, family and social history reviewed and updated as indicated. Interim medical history since our last visit reviewed. Allergies and medications reviewed and updated. Outpatient Medications Prior to Visit  Medication Sig Dispense Refill   atorvastatin (LIPITOR) 10 MG tablet TAKE ONE TABLET BY MOUTH DAILY 90 tablet 3   lisinopril (ZESTRIL) 5 MG tablet TAKE ONE TABLET BY MOUTH DAILY 90 tablet 3   Omega-3 Fatty Acids (OMEGA 3 500 PO) Take by mouth.     No facility-administered medications prior to visit.     Per HPI unless specifically  indicated in ROS section below Review of Systems  Constitutional:  Negative for appetite change, chills and fever.  HENT:  Negative for ear discharge, ear pain, sinus pressure, sinus pain and sore throat.   Respiratory:  Positive for cough. Negative for shortness of breath.   Gastrointestinal:  Negative for constipation, diarrhea, nausea and vomiting.  Neurological:  Negative for headaches.   Objective:  Temp 98.8 F (37.1 C) Comment: per patient this morning  Wt Readings from Last 3 Encounters:  05/05/21 204 lb (92.5 kg)  03/02/21 194 lb (88 kg)  11/13/20 204 lb (92.5 kg)       Physical exam: Gen: alert, NAD, not ill appearing Pulm: speaks in complete sentences without increased work of breathing Psych: normal mood, normal thought content      Results for orders placed or performed in visit on 04/15/21  PSA, Medicare  Result Value Ref Range   PSA 9.94 (H) 0.10 - 4.00 ng/ml  Lipid panel  Result Value Ref Range   Cholesterol 126 0 - 200 mg/dL   Triglycerides 64.0 0.0 - 149.0 mg/dL   HDL 50.30 >39.00 mg/dL   VLDL 12.8 0.0 - 40.0 mg/dL   LDL Cholesterol 63 0 - 99 mg/dL   Total CHOL/HDL Ratio 2    NonHDL 75.34   Comprehensive metabolic panel  Result Value Ref Range   Sodium 140 135 - 145 mEq/L   Potassium 5.0 3.5 - 5.1 mEq/L   Chloride 107 96 - 112 mEq/L   CO2 31 19 - 32 mEq/L  Glucose, Bld 107 (H) 70 - 99 mg/dL   BUN 21 6 - 23 mg/dL   Creatinine, Ser 0.89 0.40 - 1.50 mg/dL   Total Bilirubin 0.6 0.2 - 1.2 mg/dL   Alkaline Phosphatase 68 39 - 117 U/L   AST 14 0 - 37 U/L   ALT 11 0 - 53 U/L   Total Protein 6.4 6.0 - 8.3 g/dL   Albumin 4.2 3.5 - 5.2 g/dL   GFR 79.41 >60.00 mL/min   Calcium 9.3 8.4 - 10.5 mg/dL   Assessment & Plan:   Problem List Items Addressed This Visit       Other   COVID-19 - Primary    Tested positive at home.  Did discuss that treatment is EUA only.  We will elect to go with Paxlovid.  Patient's kidney functions been stable for many  years.  Patient will hold Lipitor for 2 weeks while on Paxlovid.  He will follow-up if no improvement.  Did discuss CDC recommendations in regards to self-isolation/quarantine.  Discussed signs and symptoms when to be seen urgent or emergently.      Relevant Medications   nirmatrelvir/ritonavir EUA (PAXLOVID) 20 x 150 MG & 10 x '100MG'$  TABS     Meds ordered this encounter  Medications   DISCONTD: nirmatrelvir/ritonavir EUA (PAXLOVID) 20 x 150 MG & 10 x '100MG'$  TABS    Sig: Take 3 tablets by mouth 2 (two) times daily for 5 days. (Take nirmatrelvir 150 mg two tablets twice daily for 5 days and ritonavir 100 mg one tablet twice daily for 5 days) Patient GFR is 79 HOLD LIPITOR for 2 WEEKS    Dispense:  30 tablet    Refill:  0    Order Specific Question:   Supervising Provider    Answer:   Loura Pardon A [[id number]]   nirmatrelvir/ritonavir EUA (PAXLOVID) 20 x 150 MG & 10 x '100MG'$  TABS    Sig: Take 3 tablets by mouth 2 (two) times daily for 5 days. (Take nirmatrelvir 150 mg two tablets twice daily for 5 days and ritonavir 100 mg one tablet twice daily for 5 days) Patient GFR is 79 HOLD LIPITOR for 2 WEEKS    Dispense:  30 tablet    Refill:  0    Order Specific Question:   Supervising Provider    Answer:   TOWER, MARNE A [[id number]]   No orders of the defined types were placed in this encounter.   I discussed the assessment and treatment plan with the patient. The patient was provided an opportunity to ask questions and all were answered. The patient agreed with the plan and demonstrated an understanding of the instructions. The patient was advised to call back or seek an in-person evaluation if the symptoms worsen or if the condition fails to improve as anticipated.  Follow up plan: Return if symptoms worsen or fail to improve.  Victor Garret, NP

## 2021-11-02 NOTE — Assessment & Plan Note (Signed)
Tested positive at home.  Did discuss that treatment is EUA only.  We will elect to go with Paxlovid.  Patient's kidney functions been stable for many years.  Patient will hold Lipitor for 2 weeks while on Paxlovid.  He will follow-up if no improvement.  Did discuss CDC recommendations in regards to self-isolation/quarantine.  Discussed signs and symptoms when to be seen urgent or emergently.

## 2021-11-06 ENCOUNTER — Encounter: Payer: Self-pay | Admitting: Family Medicine

## 2021-11-07 ENCOUNTER — Encounter: Payer: Self-pay | Admitting: Dermatology

## 2022-02-23 DIAGNOSIS — H2513 Age-related nuclear cataract, bilateral: Secondary | ICD-10-CM | POA: Diagnosis not present

## 2022-02-23 DIAGNOSIS — H524 Presbyopia: Secondary | ICD-10-CM | POA: Diagnosis not present

## 2022-02-23 DIAGNOSIS — H5203 Hypermetropia, bilateral: Secondary | ICD-10-CM | POA: Diagnosis not present

## 2022-03-03 ENCOUNTER — Ambulatory Visit (INDEPENDENT_AMBULATORY_CARE_PROVIDER_SITE_OTHER): Payer: Medicare Other

## 2022-03-03 VITALS — Ht 74.0 in | Wt 194.0 lb

## 2022-03-03 DIAGNOSIS — Z0000 Encounter for general adult medical examination without abnormal findings: Secondary | ICD-10-CM

## 2022-03-03 DIAGNOSIS — Z1211 Encounter for screening for malignant neoplasm of colon: Secondary | ICD-10-CM

## 2022-03-03 NOTE — Progress Notes (Signed)
I connected with  Victor Ball on 03/03/22 by a audio enabled telemedicine application and verified that I am speaking with the correct person using two identifiers.  Patient Location: Home  Provider Location: Office/Clinic  I discussed the limitations of evaluation and management by telemedicine. The patient expressed understanding and agreed to proceed.  Subjective:   Victor Ball is a 82 y.o. male who presents for Medicare Annual/Subsequent preventive examination.  Review of Systems     Cardiac Risk Factors include: advanced age (>28mn, >>55women)     Objective:    Today's Vitals   03/03/22 0754  Weight: 194 lb (88 kg)  Height: 6' 2"$  (1.88 m)   Body mass index is 24.91 kg/m.     03/03/2022    8:07 AM 03/02/2021   12:05 PM 10/30/2020    8:29 AM 02/29/2020   11:55 AM 09/10/2019   12:19 PM 06/04/2019    8:30 AM 02/27/2019    3:34 PM  Advanced Directives  Does Patient Have a Medical Advance Directive? Yes Yes Yes Yes Yes Yes Yes  Type of AParamedicof AWest CarsonLiving will Living will;Healthcare Power of AGreen LakeLiving will HDearingLiving will H[place]eLiving will Living will;Healthcare Power of [place]aycomoLiving will  Does patient want to make changes to medical advance directive?  Yes (MAU/Ambulatory/Procedural Areas - Information given)       Copy of HElvastonin Chart? No - copy requested   No - copy requested Yes - validated most recent copy scanned in chart (See row information)  Yes - validated most recent copy scanned in chart (See row information)    Current Medications (verified) Outpatient Encounter Medications as of 03/03/2022  Medication Sig   atorvastatin (LIPITOR) 10 MG tablet TAKE ONE TABLET BY MOUTH DAILY   lisinopril (ZESTRIL) 5 MG tablet TAKE ONE TABLET BY MOUTH DAILY   Omega-3 Fatty Acids (OMEGA 3 500 PO) Take by mouth.    No facility-administered encounter medications on file as of 03/03/2022.    Allergies (verified) Patient has no known allergies.   History: Past Medical History:  Diagnosis Date   Duodenal adenoma    duodenal nodule   Hepatitis C, chronic (HCC)    treated prev with Harvoni, tested and cleared   History of kidney stones    passed stone   Hyperlipidemia    Hypertension    Wears glasses    Past Surgical History:  Procedure Laterality Date   APPENDECTOMY  1948   BIOPSY  10/30/2020   Procedure: BIOPSY;  Surgeon: MIrving Copas, MD;  [place]  Service: Gastroenterology;;   ENDOSCOPIC MUCOSAL RESECTION N/A 09/10/2019   Procedure: ENDOSCOPIC MUCOSAL RESECTION;  Surgeon: MIrving Copas, MD;  Location[place]  Service: Gastroenterology;  Laterality: N/A;   ENTEROSCOPY N/A 10/30/2020   Procedure: ENTEROSCOPY;  Surgeon: MRush LandmarkGTelford Nab, MD;  [place]  Service: Gastroenterology;  Laterality: N/A;   ESOPHAGEAL DILATION  09/10/2019   Procedure: ESOPHAGEAL DILATION;  Surgeon: MRush LandmarkGTelford Nab, MD;  [place]  Service: Gastroenterology;;   ESOPHAGOGASTRODUODENOSCOPY (EGD) WITH PROPOFOL N/A 06/04/2019   Procedure: ESOPHAGOGASTRODUODENOSCOPY (EGD) WITH PROPOFOL;  Surgeon: TVirgel Manifold MD;  Location: [facility];  Service: Endoscopy;  Laterality: N/A;   ESOPHAGOGASTRODUODENOSCOPY (EGD) WITH PROPOFOL N/A 09/10/2019   Procedure: ESOPHAGOGASTRODUODENOSCOPY (EGD) WITH PROPOFOL;  Surgeon: MRush LandmarkGTelford Nab, MD;  Location: [place]  Service: Gastroenterology;  Laterality: N/A;   HEMOSTASIS CLIP PLACEMENT  09/10/2019   Procedure: HEMOSTASIS CLIP PLACEMENT;  Surgeon: Irving Copas., MD;  Location: [place];  Service: Gastroenterology;;   LIVER BIOPSY     POLYPECTOMY  09/10/2019   Procedure: POLYPECTOMY;  Surgeon: Irving Copas., MD;  Location: [place];  Service:  Gastroenterology;;   POLYPECTOMY  10/30/2020   Procedure: POLYPECTOMY;  Surgeon: Irving Copas., MD;  Location: [place];  Service: Gastroenterology;;   PROSTATE BIOPSY     neg, x2   SUBMUCOSAL LIFTING INJECTION  09/10/2019   Procedure: SUBMUCOSAL LIFTING INJECTION;  Surgeon: Irving Copas., MD;  Location: [place];  Service: Gastroenterology;;   SUBMUCOSAL TATTOO INJECTION  09/10/2019   Procedure: SUBMUCOSAL TATTOO INJECTION;  Surgeon: Irving Copas., MD;  Location: [place];  Service: Gastroenterology;;   teeth extractions     TOE FUSION  1985   left great toe   TONSILLECTOMY AND ADENOIDECTOMY  1945   TOTAL HIP ARTHROPLASTY Left 11/09/2016   Procedure: LEFT TOTAL HIP ARTHROPLASTY ANTERIOR APPROACH;  Surgeon: Mcarthur Rossetti, MD;  Location: [place];  Service: Orthopedics;  Laterality: Left;   TOTAL HIP ARTHROPLASTY Right 09/13/2017   Procedure: RIGHT TOTAL HIP ARTHROPLASTY ANTERIOR APPROACH;  Surgeon: Mcarthur Rossetti, MD;  Location: [place];  Service: Orthopedics;  Laterality: Right;   UPPER ESOPHAGEAL ENDOSCOPIC ULTRASOUND (EUS) N/A 09/10/2019   Procedure: UPPER ESOPHAGEAL ENDOSCOPIC ULTRASOUND (EUS);  Surgeon: Irving Copas., MD;  Location: [place];  Service: Gastroenterology;  Laterality: N/A;   VASECTOMY     WISDOM TOOTH EXTRACTION Right 02/26/2021   Family History  Problem Relation Age of Onset   Heart disease Father    Diabetes Father    Heart attack Father    Celiac disease Sister    Lung cancer Sister    Heart disease Brother        MI then stents   Heart attack Brother    Stroke Brother    Diabetes Brother    Cancer Paternal Aunt    Prostate cancer Neg Hx    Colon cancer Neg Hx    Esophageal cancer Neg Hx    Inflammatory bowel disease Neg Hx    Liver disease Neg Hx    Pancreatic cancer Neg Hx    Rectal cancer Neg Hx    Stomach cancer Neg Hx    Social History   Socioeconomic History   Marital  status: Married    Spouse name: Not on file   Number of children: 2   Years of education: Not on file   Highest education level: Not on file  Occupational History   Occupation: Insurance agent---retired    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   Drug use: No   Sexual activity: Yes  Other Topics Concern   Not on file  Social History Narrative   2 daughters   Married 1961   Partially retired from Insurance underwriter, still consulting some as of 2023      Has living will   Wife, then daughters equally if wife were incapacitated, has health care POA.   Would accept resuscitation attempts   Not  sure about tube feeds   Social Determinants of Health   Financial Resource Strain: Low Risk  (03/03/2022)   Overall Financial Resource Strain (CARDIA)    Difficulty of Paying Living Expenses: Not hard at all  Food Insecurity: No Food Insecurity (03/03/2022)   Hunger Vital Sign    Worried About Running Out of Food in the Last Year: Never true    Ran Out of Food in the Last Year: Never true  Transportation Needs: No Transportation Needs (03/03/2022)   PRAPARE - Hydrologist (Medical): No    Lack of Transportation (Non-Medical): No  Physical Activity: Sufficiently Active (03/03/2022)   Exercise Vital Sign    Days of Exercise per Week: 5 days    Minutes of Exercise per Session: 120 min  Stress: No Stress Concern Present (03/03/2022)   [place]    Feeling of Stress : Not at all  Social Connections: [place] (03/03/2022)   Social Connection and Isolation Panel [NHANES]    Frequency of Communication with Friends and Family: More than  three times a week    Frequency of Social Gatherings with Friends and Family: More than three times a week    Attends Religious Services: More than 4 times per year    Active Member of [employer] or Organizations: Yes    Attends Music therapist: More than 4 times per year    Marital Status: Married    Tobacco Counseling Counseling given: Not Answered   Clinical Intake:  Pre-visit preparation completed: Yes  Pain : No/denies pain     Nutritional Risks: None Diabetes: No  How often do you need to have someone help you when you read instructions, pamphlets, or other written materials from your doctor or pharmacy?: 1 - Never  Diabetic? no  Interpreter Needed?: No  Information entered by :: C.Vivianne Carles LPN   Activities of Daily Living    03/03/2022    8:09 AM 03/02/2022   10:32 AM  In your present state of health, do you have any difficulty performing the following activities:  Hearing? 0 0  Vision? 0 0  Difficulty concentrating or making decisions? 0 0  Walking or climbing stairs? 0 0  Dressing or bathing? 0 0  Doing errands, shopping? 0 0  Preparing Food and eating ? N N  Using the Toilet? N N  In the past six months, have you accidently leaked urine? N N  Do you have problems with loss of bowel control? N N  Managing your Medications? N N  Managing your Finances? N N  Housekeeping or managing your Housekeeping? N N    Patient Care Team: Tonia Ghent, MD as PCP - General (Family Medicine) Mcarthur Rossetti, MD as Consulting Physician (Orthopedic Surgery) Eugenie Birks Paschal Dopp., MD as Referring Physician (Dentistry) Ralene Bathe, MD as Referring Physician (Dermatology)  Indicate any recent Medical Services you may have received from other than [facility] providers in the past year (date may be approximate).     Assessment:   This is a routine wellness examination for [place].  Hearing/Vision screen Hearing Screening - Comments:: No  aids Vision Screening - Comments:: Wears Glasses - Dr. Georgiana Shore   Dietary issues and exercise activities discussed: Current Exercise Habits: Structured exercise class, Type of exercise: Other - see comments (tennis and pickleball), Time (Minutes): > 60, Frequency (Times/Week): 5, Weekly Exercise (Minutes/Week): 0, Intensity: Moderate, Exercise limited by: None identified  Goals Addressed             This Visit's Progress    Patient Stated       Would like to travel more.       Depression Screen    03/03/2022    8:02 AM 03/02/2021   12:10 PM 02/29/2020   11:57 AM 02/27/2019    3:36 PM 02/20/2018    3:29 PM 02/18/2017    8:39 AM 02/17/2016    2:15 PM  PHQ 2/9 Scores  PHQ - 2 Score 0 0 0 0 0 0 0  PHQ- 9 Score   0 0 0 0     Fall Risk    03/03/2022    7:58 AM 03/02/2022   10:32 AM 03/02/2021   12:08 PM 02/29/2020   11:57 AM 02/27/2019    3:34 PM  Fall Risk   Falls in the past year? 0 0 0 0 0  Number falls in past yr: 0  0 0 0  Injury with Fall? 0  0 0 0  Risk for fall due to : No Fall Risks  No Fall Risks No Fall Risks No Fall Risks  Follow up Falls prevention discussed;Falls evaluation completed  Falls prevention discussed Falls evaluation completed;Falls prevention discussed Falls evaluation completed;Falls prevention discussed    FALL RISK PREVENTION PERTAINING TO THE HOME:  Any stairs in or around the home? No  If so, are there any without handrails? No  Home free of loose throw rugs in walkways, pet beds, electrical cords, etc? Yes  Adequate lighting in your home to reduce risk of falls? Yes   ASSISTIVE DEVICES UTILIZED TO PREVENT FALLS:  Life alert? No  Use of a cane, walker or w/c? No  Grab bars in the bathroom? Yes  Shower chair or bench in shower? Yes  Elevated toilet seat or a handicapped toilet? Yes    Cognitive Function:    02/29/2020   12:00 PM 02/27/2019    3:38 PM 02/20/2018   11:08 AM 02/18/2017    8:39 AM 02/17/2016    2:15 PM  MMSE - Mini Mental  State Exam  Orientation to time 5 5 5 5 5  $ Orientation to Place 5 5 5 5 5  $ Registration 3 3 3 3 3  $ Attention/ Calculation 5 5 0 0 0  Recall 3 3 3 3 3  $ Language- name 2 objects   0 0 0  Language- repeat 1 1 1 1 1  $ Language- follow 3 step command   3 3 3  $ Language- read & follow direction   0 0 0  Write a sentence   0 0 0  Copy design   0 0 0  Total score   20 20 20        $ 03/03/2022    8:20 AM  6CIT Screen  What Year? 0 points  What month? 0 points  What time? 0 points  Count back from 20 0 points  Months in reverse 0 points  Repeat phrase 0 points  Total Score 0 points    Immunizations Immunization History  Administered Date(s) Administered   Fluad Quad(high Dose 65+) 09/15/2018   Influenza Split 10/06/2011   Influenza, High Dose Seasonal PF 10/07/2021   Influenza,inj,Quad PF,6+ Mos 10/04/2012, 12/28/2013, 12/11/2014, 12/10/2015, 10/11/2016, 10/27/2017   Influenza-Unspecified 11/04/2008, 12/02/2010, 10/24/2019, 10/19/2020   PFIZER(Purple Top)SARS-COV-2 Vaccination 01/29/2019, 02/19/2019, 10/24/2019, 05/19/2020   Pneumococcal Conjugate-13 08/15/2014   Pneumococcal Polysaccharide-23 01/19/2003, 07/15/2003, 06/16/2012   Td 07/15/2003, 10/24/2012  Tdap 01/19/2003   Zoster Recombinat (Shingrix) 08/03/2019, 11/25/2019   Zoster, Live 01/19/2007, 10/25/2007    TDAP status: Due, Education has been provided regarding the importance of this vaccine. Advised may receive this vaccine at local pharmacy or Health Dept. Aware to provide a copy of the vaccination record if obtained from local pharmacy or Health Dept. Verbalized acceptance and understanding.  Flu Vaccine status: Up to date  Pneumococcal vaccine status: Up to date  Covid-19 vaccine status: Information provided on how to obtain vaccines.   Qualifies for Shingles Vaccine? No   Zostavax completed Yes   Shingrix Completed?: Yes  Screening Tests Health Maintenance  Topic Date Due   COVID-19 Vaccine (5 - 2023-24  season) 09/18/2021   DTaP/Tdap/Td (4 - Td or Tdap) 10/25/2022   Medicare Annual Wellness (AWV)  03/04/2023   Pneumonia Vaccine 61+ Years old  Completed   INFLUENZA VACCINE  Completed   Zoster Vaccines- Shingrix  Completed   HPV VACCINES  Aged Out    Health Maintenance  Health Maintenance Due  Topic Date Due   COVID-19 Vaccine (5 - 2023-24 season) 09/18/2021    Colorectal cancer screening: Referral to GI placed 03/03/2022. Pt aware the office will call re: appt.  Lung Cancer Screening: (Low Dose CT Chest recommended if Age 64-80 years, 30 pack-year currently smoking OR have quit w/in 15years.) does not qualify.   Lung Cancer Screening Referral: none  Additional Screening:  Hepatitis C Screening: does not qualify; Completed not required  Vision Screening: Recommended annual ophthalmology exams for early detection of glaucoma and other disorders of the eye. Is the patient up to date with their annual eye exam?  Yes  Who is the provider or what is the name of the office in which the patient attends annual eye exams? Dr.Podyl If pt is not established with a provider, would they like to be referred to a provider to establish care? No .   Dental Screening: Recommended annual dental exams for proper oral hygiene  Community Resource Referral / Chronic Care Management: CRR required this visit?  No   CCM required this visit?  No      Plan:     I have personally reviewed and noted the following in the patient's chart:   Medical and social history Use of alcohol, tobacco or illicit drugs  Current medications and supplements including opioid prescriptions. Patient is not currently taking opioid prescriptions. Functional ability and status Nutritional status Physical activity Advanced directives List of other physicians Hospitalizations, surgeries, and ER visits in previous 12 months Vitals Screenings to include cognitive, depression, and falls Referrals and  appointments  In addition, I have reviewed and discussed with patient certain preventive protocols, quality metrics, and best practice recommendations. A written personalized care plan for preventive services as well as general preventive health recommendations were provided to patient.     Lebron Conners, LPN   [id number]   Nurse Notes: Referral to GI placed, pt requested appt. For colonoscopy.

## 2022-03-03 NOTE — Patient Instructions (Signed)
[name] , Thank you for taking time to come for your Medicare Wellness Visit. I appreciate your ongoing commitment to your health goals. Please review the following plan we discussed and let me know if I can assist you in the future.   These are the goals we discussed:  Goals      Increase physical activity     Starting 02/20/2018, I will continue to do Silver Sneakers for 45 minutes 3 days per week, to do cardio for 30 minutes 3 days per week, to play tennis for 2 hours weekly, and to play golf and to hike as weather permits.      Patient Stated     02/27/2019, I will continue exercising at the hospital gym 3 times a week for at least 60 minutes.     Patient Stated     02/29/2020, I will continue to play golf and tennis 4 days a week between 2-4 hours.      Patient Stated     Would like to maintain current routine      Patient Stated     Would like to travel more.        This is a list of the screening recommended for you and due dates:  Health Maintenance  Topic Date Due   COVID-19 Vaccine (5 - 2023-24 season) 09/18/2021   DTaP/Tdap/Td vaccine (4 - Td or Tdap) 10/25/2022   Medicare Annual Wellness Visit  03/04/2023   Pneumonia Vaccine  Completed   Flu Shot  Completed   Zoster (Shingles) Vaccine  Completed   HPV Vaccine  Aged Out    Advanced directives: Patient will provide copy.  Conditions/risks identified: none  Next appointment: Follow up in one year for your annual wellness visit. 03/08/2023 @ 8:00 via telephone.  Preventive Care 32 Years and Older, Male  Preventive care refers to lifestyle choices and visits with your health care provider that can promote health and wellness. What does preventive care include? A yearly physical exam. This is also called an annual well check. Dental exams once or twice a year. Routine eye exams. Ask your health care provider how often you should have your eyes checked. Personal lifestyle choices, including: Daily care of your teeth  and gums. Regular physical activity. Eating a healthy diet. Avoiding tobacco and drug use. Limiting alcohol use. Practicing safe sex. Taking low doses of aspirin every day. Taking vitamin and mineral supplements as recommended by your health care provider. What happens during an annual well check? The services and screenings done by your health care provider during your annual well check will depend on your age, overall health, lifestyle risk factors, and family history of disease. Counseling  Your health care provider may ask you questions about your: Alcohol use. Tobacco use. Drug use. Emotional well-being. Home and relationship well-being. Sexual activity. Eating habits. History of falls. Memory and ability to understand (cognition). Work and work Statistician. Screening  You may have the following tests or measurements: Height, weight, and BMI. Blood pressure. Lipid and cholesterol levels. These may be checked every 5 years, or more frequently if you are over 84 years old. Skin check. Lung cancer screening. You may have this screening every year starting at age 84 if you have a 30-pack-year history of smoking and currently smoke or have quit within the past 15 years. Fecal occult blood test (FOBT) of the stool. You may have this test every year starting at age 4. Flexible sigmoidoscopy or colonoscopy. You may have  a sigmoidoscopy every 5 years or a colonoscopy every 10 years starting at age 75. Prostate cancer screening. Recommendations will vary depending on your family history and other risks. Hepatitis C blood test. Hepatitis B blood test. Sexually transmitted disease (STD) testing. Diabetes screening. This is done by checking your blood sugar (glucose) after you have not eaten for a while (fasting). You may have this done every 1-3 years. Abdominal aortic aneurysm (AAA) screening. You may need this if you are a current or former smoker. Osteoporosis. You may be screened  starting at age 58 if you are at high risk. Talk with your health care provider about your test results, treatment options, and if necessary, the need for more tests. Vaccines  Your health care provider may recommend certain vaccines, such as: Influenza vaccine. This is recommended every year. Tetanus, diphtheria, and acellular pertussis (Tdap, Td) vaccine. You may need a Td booster every 10 years. Zoster vaccine. You may need this after age 1. Pneumococcal 13-valent conjugate (PCV13) vaccine. One dose is recommended after age 41. Pneumococcal polysaccharide (PPSV23) vaccine. One dose is recommended after age 15. Talk to your health care provider about which screenings and vaccines you need and how often you need them. This information is not intended to replace advice given to you by your health care provider. Make sure you discuss any questions you have with your health care provider. Document Released: 01/31/2015 Document Revised: 09/24/2015 Document Reviewed: 11/05/2014 Elsevier Interactive Patient Education  2017 [place] Prevention in the Home Falls can cause injuries. They can happen to people of all ages. There are many things you can do to make your home safe and to help prevent falls. What can I do on the outside of my home? Regularly fix the edges of walkways and driveways and fix any cracks. Remove anything that might make you trip as you walk through a door, such as a raised step or threshold. Trim any bushes or trees on the path to your home. Use bright outdoor lighting. Clear any walking paths of anything that might make someone trip, such as rocks or tools. Regularly check to see if handrails are loose or broken. Make sure that both sides of any steps have handrails. Any raised decks and porches should have guardrails on the edges. Have any leaves, snow, or ice cleared regularly. Use sand or salt on walking paths during winter. Clean up any spills in your garage  right away. This includes oil or grease spills. What can I do in the bathroom? Use night lights. Install grab bars by the toilet and in the tub and shower. Do not use towel bars as grab bars. Use non-skid mats or decals in the tub or shower. If you need to sit down in the shower, use a plastic, non-slip stool. Keep the floor dry. Clean up any water that spills on the floor as soon as it happens. Remove soap buildup in the tub or shower regularly. Attach bath mats securely with double-sided non-slip rug tape. Do not have throw rugs and other things on the floor that can make you trip. What can I do in the bedroom? Use night lights. Make sure that you have a light by your bed that is easy to reach. Do not use any sheets or blankets that are too big for your bed. They should not hang down onto the floor. Have a firm chair that has side arms. You can use this for support while you get dressed. Do  not have throw rugs and other things on the floor that can make you trip. What can I do in the kitchen? Clean up any spills right away. Avoid walking on wet floors. Keep items that you use a lot in easy-to-reach places. If you need to reach something above you, use a strong step stool that has a grab bar. Keep electrical cords out of the way. Do not use floor polish or wax that makes floors slippery. If you must use wax, use non-skid floor wax. Do not have throw rugs and other things on the floor that can make you trip. What can I do with my stairs? Do not leave any items on the stairs. Make sure that there are handrails on both sides of the stairs and use them. Fix handrails that are broken or loose. Make sure that handrails are as long as the stairways. Check any carpeting to make sure that it is firmly attached to the stairs. Fix any carpet that is loose or worn. Avoid having throw rugs at the top or bottom of the stairs. If you do have throw rugs, attach them to the floor with carpet tape. Make  sure that you have a light switch at the top of the stairs and the bottom of the stairs. If you do not have them, ask someone to add them for you. What else can I do to help prevent falls? Wear shoes that: Do not have high heels. Have rubber bottoms. Are comfortable and fit you well. Are closed at the toe. Do not wear sandals. If you use a stepladder: Make sure that it is fully opened. Do not climb a closed stepladder. Make sure that both sides of the stepladder are locked into place. Ask someone to hold it for you, if possible. Clearly mark and make sure that you can see: Any grab bars or handrails. First and last steps. Where the edge of each step is. Use tools that help you move around (mobility aids) if they are needed. These include: Canes. Walkers. Scooters. Crutches. Turn on the lights when you go into a dark area. Replace any light bulbs as soon as they burn out. Set up your furniture so you have a clear path. Avoid moving your furniture around. If any of your floors are uneven, fix them. If there are any pets around you, be aware of where they are. Review your medicines with your doctor. Some medicines can make you feel dizzy. This can increase your chance of falling. Ask your doctor what other things that you can do to help prevent falls. This information is not intended to replace advice given to you by your health care provider. Make sure you discuss any questions you have with your health care provider. Document Released: 10/31/2008 Document Revised: 06/12/2015 Document Reviewed: 02/08/2014 Elsevier Interactive Patient Education  2017 [employer].

## 2022-03-21 ENCOUNTER — Encounter: Payer: Self-pay | Admitting: Emergency Medicine

## 2022-03-21 ENCOUNTER — Ambulatory Visit
Admission: EM | Admit: 2022-03-21 | Discharge: 2022-03-21 | Disposition: A | Discharge status: Home / Self Care | Payer: Medicare Other

## 2022-03-21 DIAGNOSIS — S6992XA Unspecified injury of left wrist, hand and finger(s), initial encounter: Secondary | ICD-10-CM

## 2022-03-21 NOTE — Discharge Instructions (Signed)
I suspect the inflamed tissue adjacent to your fingernail is being further irritated by the fingernail cutting into the damaged tissue.  You declined the suggestion of partial removal of the nail, understandably.  I recommend the following interventions to prevent infection and promote relief of inflammation which will allow your finger to heal without a surgical intervention.  - Apply hydrocortisone ointment to the inflamed tissue twice a day after,. - Soak in Betadine twice a day to prevent infection - Soak in warm water with dissolved Epsom salts twice a day to prevent infection and relieve inflammation     If you develop infection, if symptoms worsen, I recommend seeing another urgent care facility in [place] for partial nail removal.

## 2022-03-21 NOTE — ED Provider Notes (Signed)
Victor Ball    CSN: [id number] Arrival date & time: 03/21/22  0950      History   Chief Complaint Chief Complaint  Patient presents with   Finger Injury    HPI Victor Ball is a 82 y.o. male.   HPI  Patient presents to urgent care with report of left thumb injury 1 week ago.  Patient states he smashed his thumb in a bathroom stall.  Has been cleaning the area and applying antibiotic ointment.  Presents because his wife expressed concern about a trip to [place] tomorrow.  Past Medical History:  Diagnosis Date   Duodenal adenoma    duodenal nodule   Hepatitis C, chronic ([place])    treated prev with Harvoni, tested and cleared   History of kidney stones    passed stone   Hyperlipidemia    Hypertension    Wears glasses     Patient Active Problem List   Diagnosis Date Noted   COVID-19 11/02/2021   Duodenal adenoma 07/15/2019   Duodenal nodule 07/15/2019   Compression, esophagus    Stomach irritation    Gastric polyp    Esophageal dysphagia 03/07/2019   Aorta disorder ([place]) 03/07/2019   Cough 03/05/2018   Status post total replacement of right hip 09/13/2017   Essential hypertension 08/10/2017   Preoperative cardiovascular examination 08/10/2017   Unilateral primary osteoarthritis, right hip 07/13/2017   Status post total replacement of left hip 11/09/2016   Pain of left hip joint 10/18/2016   Unilateral primary osteoarthritis, left hip 10/18/2016   Lumbar spondylosis 07/19/2016   Healthcare maintenance 02/19/2016   ED (erectile dysfunction) 02/19/2016   Rash 02/19/2016   Hyperglycemia 02/19/2016   Advance care planning 01/06/2014   Osteoarthritis, hip, bilateral 10/31/2012   Medicare annual wellness visit, subsequent 10/24/2012   Left hip pain 10/04/2012   Elevated PSA 10/06/2011   Hyperlipidemia    Kidney stone    History of hepatitis C, previously treated with Harvoni    FH: CAD (coronary artery disease) 11/27/2009    Past Surgical History:   Procedure Laterality Date   APPENDECTOMY  1948   BIOPSY  10/30/2020   Procedure: BIOPSY;  Surgeon: Irving Copas., MD;  Location: [place];  Service: Gastroenterology;;   ENDOSCOPIC MUCOSAL RESECTION N/A 09/10/2019   Procedure: ENDOSCOPIC MUCOSAL RESECTION;  Surgeon: Irving Copas., MD;  Location: [facility] ENDOSCOPY;  Service: Gastroenterology;  Laterality: N/A;   ENTEROSCOPY N/A 10/30/2020   Procedure: ENTEROSCOPY;  Surgeon: Rush Landmark Telford Nab., MD;  Location: [place];  Service: Gastroenterology;  Laterality: N/A;   ESOPHAGEAL DILATION  09/10/2019   Procedure: ESOPHAGEAL DILATION;  Surgeon: Rush Landmark Telford Nab., MD;  Location: [place];  Service: Gastroenterology;;   ESOPHAGOGASTRODUODENOSCOPY (EGD) WITH PROPOFOL N/A 06/04/2019   Procedure: ESOPHAGOGASTRODUODENOSCOPY (EGD) WITH PROPOFOL;  Surgeon: Virgel Manifold, MD;  Location: [facility] ENDOSCOPY;  Service: Endoscopy;  Laterality: N/A;   ESOPHAGOGASTRODUODENOSCOPY (EGD) WITH PROPOFOL N/A 09/10/2019   Procedure: ESOPHAGOGASTRODUODENOSCOPY (EGD) WITH PROPOFOL;  Surgeon: Rush Landmark Telford Nab., MD;  Location: [facility];  Service: Gastroenterology;  Laterality: N/A;   HEMOSTASIS CLIP PLACEMENT  09/10/2019   Procedure: HEMOSTASIS CLIP PLACEMENT;  Surgeon: Irving Copas., MD;  Location: [place];  Service: Gastroenterology;;   LIVER BIOPSY     POLYPECTOMY  09/10/2019   Procedure: POLYPECTOMY;  Surgeon: Irving Copas., MD;  Location: [place];  Service: Gastroenterology;;   POLYPECTOMY  10/30/2020   Procedure: POLYPECTOMY;  Surgeon: Irving Copas., MD;  Location: [place];  Service: Gastroenterology;;   PROSTATE BIOPSY     neg, x2   SUBMUCOSAL LIFTING INJECTION  09/10/2019   Procedure: SUBMUCOSAL LIFTING INJECTION;  Surgeon: Rush Landmark Telford Nab., MD;  Location: [place];  Service: Gastroenterology;;   SUBMUCOSAL TATTOO INJECTION  09/10/2019   Procedure:  SUBMUCOSAL TATTOO INJECTION;  Surgeon: Irving Copas., MD;  [facility];  Service: Gastroenterology;;   teeth extractions     TOE FUSION  1985   left great toe   TONSILLECTOMY AND ADENOIDECTOMY  1945   TOTAL HIP ARTHROPLASTY Left 11/09/2016   Procedure: LEFT TOTAL HIP ARTHROPLASTY ANTERIOR APPROACH;  Surgeon: Mcarthur Rossetti, MD;  Location: [place];  Service: Orthopedics;  Laterality: Left;   TOTAL HIP ARTHROPLASTY Right 09/13/2017   Procedure: RIGHT TOTAL HIP ARTHROPLASTY ANTERIOR APPROACH;  Surgeon: Mcarthur Rossetti, MD;  Location: [place];  Service: Orthopedics;  Laterality: Right;   UPPER ESOPHAGEAL ENDOSCOPIC ULTRASOUND (EUS) N/A 09/10/2019   Procedure: UPPER ESOPHAGEAL ENDOSCOPIC ULTRASOUND (EUS);  Surgeon: Irving Copas., MD;  Location: [place];  Service: Gastroenterology;  Laterality: N/A;   VASECTOMY     WISDOM TOOTH EXTRACTION Right 02/26/2021       Home Medications    Prior to Admission medications   Medication Sig Start Date End Date Taking? Authorizing Provider  atorvastatin (LIPITOR) 10 MG tablet TAKE ONE TABLET BY MOUTH DAILY 06/05/21   Tonia Ghent, MD  lisinopril (ZESTRIL) 5 MG tablet TAKE ONE TABLET BY MOUTH DAILY 06/05/21   Tonia Ghent, MD  Omega-3 Fatty Acids (OMEGA 3 500 PO) Take by mouth.    [provider]    Family History Family History  Problem Relation Age of Onset   Heart disease Father    Diabetes Father    Heart attack Father    Celiac disease Sister    Lung cancer Sister    Heart disease Brother        MI then stents   Heart attack Brother    Stroke Brother    Diabetes Brother    Cancer Paternal Aunt    Prostate cancer Neg Hx    Colon cancer Neg Hx    Esophageal cancer Neg Hx    Inflammatory bowel disease Neg Hx    Liver disease Neg Hx    Pancreatic cancer Neg Hx    Rectal cancer Neg Hx    Stomach cancer Neg Hx     Social History Social History   Tobacco Use   Smoking  status: Former    Types: Cigarettes    Quit date: 01/18/1973    Years since quitting: 49.2   Smokeless tobacco: Never  Vaping Use   Vaping Use: Never used  Substance Use Topics   Alcohol use: Yes    Comment: 1 drink a day.   Drug use: No     Allergies   Patient has no known allergies.   Review of Systems Review of Systems   Physical Exam Triage Vital Signs ED Triage Vitals [03/21/22 1127]  Enc Vitals Group     BP (!) 147/70     Pulse Rate (!) 57     Resp 18     Temp 98.1 F (36.7 C)     Temp Source Oral     SpO2 96 %     Weight      Height      Head Circumference      Peak Flow      Pain Score  Pain Loc      Pain Edu?      Excl. in [place]?    No data found.  Updated Vital Signs BP (!) 147/70 (BP Location: Left Arm)   Pulse (!) 57   Temp 98.1 F (36.7 C) (Oral)   Resp 18   SpO2 96%   Visual Acuity Right Eye Distance:   Left Eye Distance:   Bilateral Distance:    Right Eye Near:   Left Eye Near:    Bilateral Near:     Physical Exam Vitals reviewed.  Constitutional:      Appearance: Normal appearance.  Musculoskeletal:       Hands:  Skin:    General: Skin is warm and dry.  Neurological:     General: No focal deficit present.     Mental Status: He is alert and oriented to person, place, and time.  Psychiatric:        Mood and Affect: Mood normal.        Behavior: Behavior normal.      UC Treatments / Results  Labs (all labs ordered are listed, but only abnormal results are displayed) Labs Reviewed - No data to display  EKG   Radiology No results found.  Procedures Procedures (including critical care time)  Medications Ordered in UC Medications - No data to display  Initial Impression / Assessment and Plan / UC Course  I have reviewed the triage vital signs and the nursing notes.  Pertinent labs & imaging results that were available during my care of the patient were reviewed by me and considered in my medical decision  making (see chart for details).   Inflammation and irritation of the tissue at the lateral edge of the thumbnail of the left hand due to crushing injury 1 week ago.  No discharge.  No significant erythema.  Suspect the inflamed tissue is further irritated by the presence of the nail edge and patient might benefit from a partial nail remover at the lateral edge however patient declines, understandably.  Propose the following interventions to prevent infection and relieve inflammation:  - Hydrocortisone ointment on the inflamed tissue. - Soaking in warm water with dissolved Epsom salts -Soaking in Betadine  Patient agrees with this treatment plan.  Final Clinical Impressions(s) / UC Diagnoses   Final diagnoses:  None   Discharge Instructions   None    ED Prescriptions   None    PDMP not reviewed this encounter.   Rose Phi, [place] 03/21/22 1152

## 2022-03-21 NOTE — ED Triage Notes (Addendum)
He smashed his left thumb in a bathroom stall door 1 week ago. He cleansed the area and applied antibiotic ointment. Last TD was 10/24/2012.

## 2022-04-07 ENCOUNTER — Encounter: Payer: Self-pay | Admitting: *Deleted

## 2022-04-07 ENCOUNTER — Telehealth: Payer: Self-pay | Admitting: Gastroenterology

## 2022-04-07 NOTE — Telephone Encounter (Signed)
Patient calling, requesting call back on April 3rd. He is out of state on vacation and will return then.

## 2022-04-25 ENCOUNTER — Other Ambulatory Visit: Payer: Self-pay | Admitting: Family Medicine

## 2022-04-25 DIAGNOSIS — Z125 Encounter for screening for malignant neoplasm of prostate: Secondary | ICD-10-CM

## 2022-04-25 DIAGNOSIS — I10 Essential (primary) hypertension: Secondary | ICD-10-CM

## 2022-05-03 ENCOUNTER — Other Ambulatory Visit (INDEPENDENT_AMBULATORY_CARE_PROVIDER_SITE_OTHER): Payer: Medicare Other

## 2022-05-03 DIAGNOSIS — Z125 Encounter for screening for malignant neoplasm of prostate: Secondary | ICD-10-CM

## 2022-05-03 DIAGNOSIS — I10 Essential (primary) hypertension: Secondary | ICD-10-CM

## 2022-05-03 LAB — COMPREHENSIVE METABOLIC PANEL
ALT: 10 U/L (ref 0–53)
AST: 14 U/L (ref 0–37)
Albumin: 4.2 g/dL (ref 3.5–5.2)
Alkaline Phosphatase: 61 U/L (ref 39–117)
BUN: 21 mg/dL (ref 6–23)
CO2: 31 mEq/L (ref 19–32)
Calcium: 9.1 mg/dL (ref 8.4–10.5)
Chloride: 103 mEq/L (ref 96–112)
Creatinine, Ser: 0.91 mg/dL (ref 0.40–1.50)
GFR: 77.53 mL/min (ref >60.00)
Glucose, Bld: 103 mg/dL — ABNORMAL HIGH (ref 70–99)
Potassium: 5 mEq/L (ref 3.5–5.1)
Sodium: 139 mEq/L (ref 135–145)
Total Bilirubin: 0.8 mg/dL (ref 0.2–1.2)
Total Protein: 6.5 g/dL (ref 6.0–8.3)

## 2022-05-03 LAB — LIPID PANEL
Cholesterol: 118 mg/dL (ref 0–200)
HDL: 52.8 mg/dL (ref >39.00)
LDL Cholesterol: 48 mg/dL (ref 0–99)
NonHDL: 65.49
Total CHOL/HDL Ratio: 2
Triglycerides: 85 mg/dL (ref 0.0–149.0)
VLDL: 17 mg/dL (ref 0.0–40.0)

## 2022-05-03 LAB — PSA, MEDICARE: PSA: 14.13 ng/ml — ABNORMAL HIGH (ref 0.10–4.00)

## 2022-05-10 ENCOUNTER — Encounter: Payer: Self-pay | Admitting: Family Medicine

## 2022-05-10 ENCOUNTER — Ambulatory Visit (INDEPENDENT_AMBULATORY_CARE_PROVIDER_SITE_OTHER): Payer: Medicare Other | Admitting: Family Medicine

## 2022-05-10 VITALS — BP 128/78 | HR 74 | Temp 97.3°F | Ht 74.0 in | Wt 200.0 lb

## 2022-05-10 DIAGNOSIS — E782 Mixed hyperlipidemia: Secondary | ICD-10-CM

## 2022-05-10 DIAGNOSIS — R269 Unspecified abnormalities of gait and mobility: Secondary | ICD-10-CM

## 2022-05-10 DIAGNOSIS — I10 Essential (primary) hypertension: Secondary | ICD-10-CM | POA: Diagnosis not present

## 2022-05-10 DIAGNOSIS — R972 Elevated prostate specific antigen [PSA]: Secondary | ICD-10-CM | POA: Diagnosis not present

## 2022-05-10 DIAGNOSIS — Z7189 Other specified counseling: Secondary | ICD-10-CM

## 2022-05-10 DIAGNOSIS — M25569 Pain in unspecified knee: Secondary | ICD-10-CM

## 2022-05-10 DIAGNOSIS — Z0000 Encounter for general adult medical examination without abnormal findings: Secondary | ICD-10-CM

## 2022-05-10 MED ORDER — ATORVASTATIN CALCIUM 10 MG PO TABS: 10.0000 mg | ORAL_TABLET | Freq: Every day | ORAL | 3 refills | Status: DC

## 2022-05-10 MED ORDER — LISINOPRIL 5 MG PO TABS: 5.0000 mg | ORAL_TABLET | Freq: Every day | ORAL | 3 refills | Status: DC

## 2022-05-10 NOTE — Patient Instructions (Addendum)
If you have trouble getting an appointment with GI, then let me know.  You should get a call about PT and urology.   Take care.  Glad to see you.

## 2022-05-10 NOTE — Progress Notes (Unsigned)
Gait change with L foot.  No full foot drop but his foot fall sounds different.  Fall cautions d/w pt.  No numbness in foot.  No pain in back.  No L sided leg sx.    R knee pain going down steps but no pain walking.  No locking or clicking.    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    Elevated Cholesterol: Using medications without problems:yes Muscle aches: not from statin.  Diet compliance: yes Exercise: yes  Living will d/w pt.  Wife designated, then daughters equally if wife were incapacitated, if patient were incapacitated.   Tetanus 2014 PNA up to date Flu 16109 RSV prev done at pharmacy.   Shingrix done.    Covid, Pfizer, 2021.   H/o neg prostate biopsy x2.  PSA higher than prev. Refer back to urology.  He has nocturia x1 or none. Discussed colon CA screening 2024.  He is going to talk to GI this year, he has referral.    PMH and SH reviewed  Meds, vitals, and allergies reviewed.   ROS: Per HPI unless specifically indicated in ROS section   GEN: nad, alert and oriented HEENT: mucous membranes moist NECK: supple w/o LA CV: rrr. PULM: ctab, no inc wob ABD: soft, +bs EXT: no edema SKIN: no acute rash Normal S/S BLE w/o foot weakness B R knee with normal ROM w/o crepitus.    30 minutes were devoted to patient care in this encounter (this includes time spent reviewing the patient's file/history, interviewing and examining the patient, counseling/reviewing plan with patient).

## 2022-05-12 DIAGNOSIS — R269 Unspecified abnormalities of gait and mobility: Secondary | ICD-10-CM | POA: Insufficient documentation

## 2022-05-12 NOTE — Assessment & Plan Note (Signed)
Continue lisinopril.  Update me as needed.  Labs discussed with patient.

## 2022-05-12 NOTE — Assessment & Plan Note (Signed)
Continue atorvastatin.  Continue work on diet and exercise.  Labs discussed with patient. 

## 2022-05-12 NOTE — Assessment & Plan Note (Signed)
Living will d/w pt.  Wife designated, then daughters equally if wife were incapacitated, if patient were incapacitated.   Tetanus 2014 PNA up to date Flu 91478 RSV prev done at pharmacy.   Shingrix done.    Covid, Pfizer, 2021.   H/o neg prostate biopsy x2.  PSA higher than prev. Refer back to urology.  He has nocturia x1 or none. Discussed colon CA screening 2024.  He is going to talk to GI this year, he has referral.

## 2022-05-12 NOTE — Assessment & Plan Note (Signed)
H/o neg prostate biopsy x2.  PSA higher than prev. Refer back to urology.  He has nocturia x1 or none.

## 2022-05-12 NOTE — Assessment & Plan Note (Signed)
Normal S/S BLE w/o foot weakness B R knee with normal ROM w/o crepitus.   I think it makes sense to see physical therapy first.  It is unclear to me if his foot fall on the left is different because of changes in his right knee.  He does not have obvious weakness.  At this point still okay for outpatient follow-up.  He can update me as needed.  He agrees with plan.

## 2022-05-12 NOTE — Assessment & Plan Note (Signed)
Living will d/w pt.  Wife designated, then daughters equally if wife were incapacitated, if patient were incapacitated.   ?

## 2022-05-20 ENCOUNTER — Ambulatory Visit: Payer: Medicare Other | Attending: Family Medicine | Admitting: Physical Therapy

## 2022-05-20 ENCOUNTER — Encounter: Payer: Self-pay | Admitting: Physical Therapy

## 2022-05-20 ENCOUNTER — Telehealth: Payer: Self-pay

## 2022-05-20 ENCOUNTER — Other Ambulatory Visit: Payer: Self-pay

## 2022-05-20 DIAGNOSIS — R269 Unspecified abnormalities of gait and mobility: Secondary | ICD-10-CM | POA: Insufficient documentation

## 2022-05-20 DIAGNOSIS — M25561 Pain in right knee: Secondary | ICD-10-CM | POA: Insufficient documentation

## 2022-05-20 DIAGNOSIS — M25551 Pain in right hip: Secondary | ICD-10-CM | POA: Insufficient documentation

## 2022-05-20 DIAGNOSIS — M25569 Pain in unspecified knee: Secondary | ICD-10-CM

## 2022-05-20 NOTE — Telephone Encounter (Signed)
Pt left message to schedule colonoscopy please return call  

## 2022-05-20 NOTE — Therapy (Signed)
OUTPATIENT PHYSICAL THERAPY LOWER EXTREMITY EVALUATION   Patient Name: Victor Ball MRN: [id number] DOB:1940-06-26, 82 y.o., male Today's Date: 05/20/2022  END OF SESSION:  PT End of Session - 05/20/22 1432     Visit Number 1    Number of Visits 24    Date for PT Re-Evaluation 08/12/22    PT Start Time 1435    PT Stop Time 1530    PT Time Calculation (min) 55 min    Activity Tolerance Patient tolerated treatment well    Behavior During Therapy WFL for tasks assessed/performed             Past Medical History:  Diagnosis Date   Duodenal adenoma    duodenal nodule   Hepatitis C, chronic (HCC)    treated prev with Harvoni, tested and cleared   History of kidney stones    passed stone   Hyperlipidemia    Hypertension    Wears glasses    Past Surgical History:  Procedure Laterality Date   APPENDECTOMY  1948   BIOPSY  10/30/2020   Procedure: BIOPSY;  Surgeon: Lemar Lofty., MD;  Location: [facility] ENDOSCOPY;  Service: Gastroenterology;;   ENDOSCOPIC MUCOSAL RESECTION N/A 09/10/2019   Procedure: ENDOSCOPIC MUCOSAL RESECTION;  Surgeon: Lemar Lofty., MD;  Location: [facility] ENDOSCOPY;  Service: Gastroenterology;  Laterality: N/A;   ENTEROSCOPY N/A 10/30/2020   Procedure: ENTEROSCOPY;  Surgeon: Meridee Score Netty Starring., MD;  Location: [facility] ENDOSCOPY;  Service: Gastroenterology;  Laterality: N/A;   ESOPHAGEAL DILATION  09/10/2019   Procedure: ESOPHAGEAL DILATION;  Surgeon: Meridee Score Netty Starring., MD;  Location: [facility] ENDOSCOPY;  Service: Gastroenterology;;   ESOPHAGOGASTRODUODENOSCOPY (EGD) WITH PROPOFOL N/A 06/04/2019   Procedure: ESOPHAGOGASTRODUODENOSCOPY (EGD) WITH PROPOFOL;  Surgeon: Pasty Spillers, MD;  Location: [facility] ENDOSCOPY;  Service: Endoscopy;  Laterality: N/A;   ESOPHAGOGASTRODUODENOSCOPY (EGD) WITH PROPOFOL N/A 09/10/2019   Procedure: ESOPHAGOGASTRODUODENOSCOPY (EGD) WITH PROPOFOL;  Surgeon: Meridee Score Netty Starring., MD;  Location: [facility] ENDOSCOPY;  Service:  Gastroenterology;  Laterality: N/A;   HEMOSTASIS CLIP PLACEMENT  09/10/2019   Procedure: HEMOSTASIS CLIP PLACEMENT;  Surgeon: Lemar Lofty., MD;  Location: [facility] ENDOSCOPY;  Service: Gastroenterology;;   JOINT REPLACEMENT  10/18 and 8/19   LIVER BIOPSY     POLYPECTOMY  09/10/2019   Procedure: POLYPECTOMY;  Surgeon: Meridee Score Netty Starring., MD;  Location: [facility] ENDOSCOPY;  Service: Gastroenterology;;   POLYPECTOMY  10/30/2020   Procedure: POLYPECTOMY;  Surgeon: Lemar Lofty., MD;  Location: [facility] ENDOSCOPY;  Service: Gastroenterology;;   PROSTATE BIOPSY     neg, x2   SUBMUCOSAL LIFTING INJECTION  09/10/2019   Procedure: SUBMUCOSAL LIFTING INJECTION;  Surgeon: Lemar Lofty., MD;  Location: [facility] ENDOSCOPY;  Service: Gastroenterology;;   SUBMUCOSAL TATTOO INJECTION  09/10/2019   Procedure: SUBMUCOSAL TATTOO INJECTION;  Surgeon: Lemar Lofty., MD;  Location: [facility] ENDOSCOPY;  Service: Gastroenterology;;   teeth extractions     TOE FUSION  1985   left great toe   TONSILLECTOMY AND ADENOIDECTOMY  1945   TOTAL HIP ARTHROPLASTY Left 11/09/2016   Procedure: LEFT TOTAL HIP ARTHROPLASTY ANTERIOR APPROACH;  Surgeon: Kathryne Hitch, MD;  Location: [facility] OR;  Service: Orthopedics;  Laterality: Left;   TOTAL HIP ARTHROPLASTY Right 09/13/2017   Procedure: RIGHT TOTAL HIP ARTHROPLASTY ANTERIOR APPROACH;  Surgeon: Kathryne Hitch, MD;  Location: [facility] OR;  Service: Orthopedics;  Laterality: Right;   UPPER ESOPHAGEAL ENDOSCOPIC ULTRASOUND (EUS) N/A 09/10/2019   Procedure: UPPER ESOPHAGEAL ENDOSCOPIC ULTRASOUND (EUS);  Surgeon: Lemar Lofty., MD;  Location: [facility] ENDOSCOPY;  Service: Gastroenterology;  Laterality: N/A;   VASECTOMY     WISDOM TOOTH EXTRACTION Right 02/26/2021   Patient Active Problem List   Diagnosis Date Noted   Gait abnormality 05/12/2022   Duodenal adenoma 07/15/2019   Duodenal nodule 07/15/2019   Compression, esophagus    Stomach irritation     Gastric polyp    Esophageal dysphagia 03/07/2019   Status post total replacement of right hip 09/13/2017   Essential hypertension 08/10/2017   Preoperative cardiovascular examination 08/10/2017   Unilateral primary osteoarthritis, right hip 07/13/2017   Status post total replacement of left hip 11/09/2016   Pain of left hip joint 10/18/2016   Unilateral primary osteoarthritis, left hip 10/18/2016   Lumbar spondylosis 07/19/2016   Healthcare maintenance 02/19/2016   Rash 02/19/2016   Hyperglycemia 02/19/2016   Advance care planning 01/06/2014   Osteoarthritis, hip, bilateral 10/31/2012   Medicare annual wellness visit, subsequent 10/24/2012   Left hip pain 10/04/2012   Elevated PSA 10/06/2011   Hyperlipidemia    Kidney stone    History of hepatitis C, previously treated with Harvoni    FH: CAD (coronary artery disease) 11/27/2009    PCP: Joaquim Nam, MD   REFERRING PROVIDER: Joaquim Nam, MD   REFERRING DIAG:  R26.9 (ICD-10-CM) - Gait abnormality  M25.569 (ICD-10-CM) - Knee pain, unspecified chronicity, unspecified laterality    THERAPY DIAG:  Gait abnormality  Knee pain, unspecified chronicity, unspecified laterality  Rationale for Evaluation and Treatment: Rehabilitation  ONSET DATE: 5 years   SUBJECTIVE:   SUBJECTIVE STATEMENT: Pt reports R knee pain for the last 5 years. States that stairs are painful. Has also noted intermittent L foot drag/catching 1-2 times a week. No fall but mild LOB. Pt also states pain the R hip following tennis and pickle ball.     PERTINENT HISTORY: Bil hip replacement in 2018-2019  PAIN:  Are you having pain? No  PRECAUTIONS: None  WEIGHT BEARING RESTRICTIONS: No  FALLS:  Has patient fallen in last 6 months? 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 Ara Kussmaul MD VISIT: April 2025  OBJECTIVE:   DIAGNOSTIC FINDINGS:   Xray  Right knee AP lateral views: Medial lateral joint lines well-preserved.   Patellofemoral joint with some mild arthritic changes.  Otherwise no bony  abnormalities no acute findings.   AP pelvis: Status post bilateral total hip arthroplasties.  Components are  well-seated.  No acute findings.  Both hips are well located.    PATIENT SURVEYS:  LEFS 77/80 FOTO 69   COGNITION: Overall cognitive status: Within functional limits for tasks assessed     SENSATION: WFL  EDEMA:    MUSCLE LENGTH: Hamstrings: Right 131 deg; Left 136 deg Thomas test: Right 136 deg; Left 121 deg  POSTURE: rounded shoulders  PALPATION: Mild   LOWER EXTREMITY ROM:  Active ROM Right eval Left eval  Hip flexion    Hip extension    Hip abduction    Hip adduction    Hip internal rotation    Hip external rotation    Knee flexion    Knee extension    Ankle dorsiflexion    Ankle plantarflexion  Ankle inversion    Ankle eversion     (Blank rows = not tested)  LOWER EXTREMITY MMT:  MMT Right eval Left eval  Hip flexion 4+ 4+  Hip extension 4 4+  Hip abduction 4+ 5  Hip adduction 5 5  Hip internal rotation 4+ 4+  Hip external rotation 4+ 4+  Knee flexion 5 5  Knee extension 5 5  Ankle dorsiflexion 5 4+  Ankle plantarflexion 5 5  Ankle inversion    Ankle eversion     (Blank rows = not tested)  LOWER EXTREMITY SPECIAL TESTS:  Knee special tests: Anterior drawer test: negative, Posterior drawer test: negative, Apley's compression test: negative, Pivot shift test: negative, Patellafemoral grind test: negative, and Step up/down test: positive   FUNCTIONAL TESTS:  6 minute walk test: 1664ft 10 meter walk test: 6.6 sec  SLS LLE 17sec RLE  29sec   GAIT: Distance walked: 1636ft Assistive device utilized: None Level of assistance: Complete Independence Comments: mild foot slap on the LLE decreased hip extension on BLE    TODAY'S TREATMENT:                                                                                                                              DATE:   PT evaluation for abnormality of gait with R knee pain, R hip pain and L ankle weakness .          PATIENT EDUCATION:  Education details: POC orientation to facility  Person educated: Patient Education method: Explanation Education comprehension: verbalized understanding  HOME EXERCISE PROGRAM: To be given at next treatment.   ASSESSMENT:  CLINICAL IMPRESSION: Patient is a 82 y.o. male who was seen today for physical therapy evaluation and treatment for difficulty walking. Pt noted to have mild R knee pain with descent of stairs, focal R hip pain in glute Med, and foot slap on the LLE. Pt demonstrated decreased muscle extensibility in BLE HS and thomas test R>L. Pt performed 6 min walk test with increasing foot slap on the LLE but no pain and LOB noted. SLS of 17 sec on the LLE and 29 sec on the RLE with significant instability in Bil hips, but able to correct with hip/ankle correction strategy to prevent LOB. Pt would benefit from PT for pain management and improved strengthening in BLE to improve QoL and allow return to PLOF.   OBJECTIVE IMPAIRMENTS: cardiopulmonary status limiting activity, decreased balance, difficulty walking, decreased ROM, decreased strength, increased fascial restrictions, impaired perceived functional ability, and impaired flexibility.   ACTIVITY LIMITATIONS: squatting, dressing, and locomotion level  PARTICIPATION LIMITATIONS: community activity and yard work  PERSONAL FACTORS: Age are also affecting patient's functional outcome.   REHAB POTENTIAL: Excellent  CLINICAL DECISION MAKING: Stable/uncomplicated  EVALUATION  COMPLEXITY: Low   GOALS:  SHORT TERM GOALS: Target date: 07/01/2022    Patient will be independent in home exercise program to improve strength/mobility for better functional independence with ADLs. Baseline:  to be given at second treatment Goal status: INITIAL   LONG TERM GOALS: Target date: 08/13/2022    Patient will increase FOTO score to equal to or greater than   74  to demonstrate statistically significant improvement in mobility and quality of life.  Baseline: 69 Goal status: INITIAL  2.  Patient will increase R hip and L ankle strength to match LLE to improve symmetry of movement improve function with dynamic activities such pickle ball and tennis  Baseline: 4/5 to 4+/5 Goal status: INITIAL  3.  Patient will increase 6 min walk test by >218ft to demonstrate decreased fall risk and improved independence during functional activities Baseline: 1668ft Goal status: INITIAL  4.  Patient will increase SLS on BLE to >30 sec to demonstrate improve function with dynamic tasks including walking on trail and dressing  Baseline: L 17 sec and R 29 sec  Goal status: INITIAL    PLAN:  PT FREQUENCY: 1-2x/week  PT DURATION: 12 weeks  PLANNED INTERVENTIONS: Therapeutic exercises, Therapeutic activity, Neuromuscular re-education, Balance training, Gait training, Patient/Family education, Self Care, Joint mobilization, Joint manipulation, Stair training, Dry Needling, Spinal manipulation, Spinal mobilization, Taping, and Manual therapy  PLAN FOR NEXT SESSION:   BLE strengthening. L ankle DF strengthening. Manual therapy for HS, Quad tightness and trigger point release.    Golden Pop, PT 05/20/2022, 5:08 PM

## 2022-05-22 NOTE — Progress Notes (Signed)
  Physician Documentation Your signature is required to indicate approval of the treatment plan as stated above. By signing this report, you are approving the plan of care. Please sign and either send electronically or print and fax the signed copy to the number below. If you approve with modifications, please indicate those in the space provided. Physician Signature: Crawford Givens Date:05/22/22  Time:__10:47 PM

## 2022-05-24 ENCOUNTER — Ambulatory Visit: Payer: Medicare Other | Admitting: Physical Therapy

## 2022-05-24 ENCOUNTER — Telehealth: Payer: Self-pay

## 2022-05-24 NOTE — Telephone Encounter (Signed)
Pt ion inref to colonoscopy please return call

## 2022-05-24 NOTE — Telephone Encounter (Signed)
Hi Dr. Stark Klein advise as to if patient needs colonoscopy.  He is 82 years of age.  Thanks,  [place], [place]

## 2022-05-24 NOTE — Telephone Encounter (Signed)
Reviewing his chart, he seems to be otherwise healthy Okay to schedule screening colonoscopy  RV

## 2022-05-24 NOTE — Telephone Encounter (Signed)
This message does not makes since can you please clarify the message please.

## 2022-05-24 NOTE — Telephone Encounter (Signed)
Pt has question in ref to colonoscopy  please return call

## 2022-05-25 ENCOUNTER — Telehealth: Payer: Self-pay

## 2022-05-25 ENCOUNTER — Other Ambulatory Visit: Payer: Self-pay

## 2022-05-25 DIAGNOSIS — Z1211 Encounter for screening for malignant neoplasm of colon: Secondary | ICD-10-CM

## 2022-05-25 MED ORDER — NA SULFATE-K SULFATE-MG SULF 17.5-3.13-1.6 GM/177ML PO SOLN: 1.0000 | Freq: Once | ORAL | 0 refills | Status: AC

## 2022-05-25 NOTE — Telephone Encounter (Signed)
Gastroenterology Pre-Procedure Review  Request Date: 06/22/22 Requesting Physician: Dr. Allegra Lai  PATIENT REVIEW QUESTIONS: The patient responded to the following health history questions as indicated:    1. Are you having any GI issues? no 2. Do you have a personal history of Polyps? yes (gastric polyps but not colon polyps) 3. Do you have a family history of Colon Cancer or Polyps? no 4. Diabetes Mellitus? no 5. Joint replacements in the past 12 months?no 6. Major health problems in the past 3 months?no 7. Any artificial heart valves, MVP, or defibrillator?no    MEDICATIONS & ALLERGIES:    Patient reports the following regarding taking any anticoagulation/antiplatelet therapy:   Plavix, Coumadin, Eliquis, Xarelto, Lovenox, Pradaxa, Brilinta, or Effient? no Aspirin? no  Patient confirms/reports the following medications:  Current Outpatient Medications  Medication Sig Dispense Refill   atorvastatin (LIPITOR) 10 MG tablet Take 1 tablet (10 mg total) by mouth daily. 90 tablet 3   lisinopril (ZESTRIL) 5 MG tablet Take 1 tablet (5 mg total) by mouth daily. 90 tablet 3   Omega-3 Fatty Acids (OMEGA 3 500 PO) Take by mouth.     No current facility-administered medications for this visit.    Patient confirms/reports the following allergies:  No Known Allergies  No orders of the defined types were placed in this encounter.   AUTHORIZATION INFORMATION Primary Insurance: 1D#: Group #:  Secondary Insurance: 1D#: Group #:  SCHEDULE INFORMATION: Date: 06/22/22 Time: Location: [facility]

## 2022-05-25 NOTE — Telephone Encounter (Signed)
Patient call has been returned to schedule his colonoscopy.  LVM for pt to return my call.   Thanks, [place], [place]

## 2022-05-25 NOTE — Telephone Encounter (Signed)
Pt returned call was having trouble with his vm please return call

## 2022-05-26 ENCOUNTER — Ambulatory Visit: Payer: Medicare Other | Admitting: Physical Therapy

## 2022-05-26 DIAGNOSIS — M25569 Pain in unspecified knee: Secondary | ICD-10-CM

## 2022-05-26 DIAGNOSIS — R269 Unspecified abnormalities of gait and mobility: Secondary | ICD-10-CM

## 2022-05-26 DIAGNOSIS — M25551 Pain in right hip: Secondary | ICD-10-CM | POA: Diagnosis not present

## 2022-05-26 DIAGNOSIS — M25561 Pain in right knee: Secondary | ICD-10-CM | POA: Diagnosis not present

## 2022-05-26 NOTE — Therapy (Signed)
OUTPATIENT PHYSICAL THERAPY LOWER EXTREMITY EVALUATION   Patient Name: Victor Ball MRN: [id number] DOB:26-Sep-1940, 82 y.o., male Today's Date: 05/27/2022  END OF SESSION:  PT End of Session - 05/26/22 1409     Visit Number 2    Number of Visits 24    Date for PT Re-Evaluation 08/12/22    Progress Note Due on Visit 10    PT Start Time 0850    PT Stop Time 0930    PT Time Calculation (min) 40 min    Activity Tolerance Patient tolerated treatment well    Behavior During Therapy WFL for tasks assessed/performed              Past Medical History:  Diagnosis Date   Duodenal adenoma    duodenal nodule   Hepatitis C, chronic (HCC)    treated prev with Harvoni, tested and cleared   History of kidney stones    passed stone   Hyperlipidemia    Hypertension    Wears glasses    Past Surgical History:  Procedure Laterality Date   APPENDECTOMY  1948   BIOPSY  10/30/2020   Procedure: BIOPSY;  Surgeon: Lemar Lofty., MD;  Location: [facility] ENDOSCOPY;  Service: Gastroenterology;;   ENDOSCOPIC MUCOSAL RESECTION N/A 09/10/2019   Procedure: ENDOSCOPIC MUCOSAL RESECTION;  Surgeon: Lemar Lofty., MD;  Location: [facility] ENDOSCOPY;  Service: Gastroenterology;  Laterality: N/A;   ENTEROSCOPY N/A 10/30/2020   Procedure: ENTEROSCOPY;  Surgeon: Meridee Score Netty Starring., MD;  Location: [facility] ENDOSCOPY;  Service: Gastroenterology;  Laterality: N/A;   ESOPHAGEAL DILATION  09/10/2019   Procedure: ESOPHAGEAL DILATION;  Surgeon: Meridee Score Netty Starring., MD;  Location: [facility] ENDOSCOPY;  Service: Gastroenterology;;   ESOPHAGOGASTRODUODENOSCOPY (EGD) WITH PROPOFOL N/A 06/04/2019   Procedure: ESOPHAGOGASTRODUODENOSCOPY (EGD) WITH PROPOFOL;  Surgeon: Pasty Spillers, MD;  Location: [facility] ENDOSCOPY;  Service: Endoscopy;  Laterality: N/A;   ESOPHAGOGASTRODUODENOSCOPY (EGD) WITH PROPOFOL N/A 09/10/2019   Procedure: ESOPHAGOGASTRODUODENOSCOPY (EGD) WITH PROPOFOL;  Surgeon: Meridee Score Netty Starring., MD;   Location: [facility] ENDOSCOPY;  Service: Gastroenterology;  Laterality: N/A;   HEMOSTASIS CLIP PLACEMENT  09/10/2019   Procedure: HEMOSTASIS CLIP PLACEMENT;  Surgeon: Lemar Lofty., MD;  Location: [facility] ENDOSCOPY;  Service: Gastroenterology;;   JOINT REPLACEMENT  10/18 and 8/19   LIVER BIOPSY     POLYPECTOMY  09/10/2019   Procedure: POLYPECTOMY;  Surgeon: Meridee Score Netty Starring., MD;  Location: [facility] ENDOSCOPY;  Service: Gastroenterology;;   POLYPECTOMY  10/30/2020   Procedure: POLYPECTOMY;  Surgeon: Lemar Lofty., MD;  Location: [facility] ENDOSCOPY;  Service: Gastroenterology;;   PROSTATE BIOPSY     neg, x2   SUBMUCOSAL LIFTING INJECTION  09/10/2019   Procedure: SUBMUCOSAL LIFTING INJECTION;  Surgeon: Lemar Lofty., MD;  Location: [facility] ENDOSCOPY;  Service: Gastroenterology;;   SUBMUCOSAL TATTOO INJECTION  09/10/2019   Procedure: SUBMUCOSAL TATTOO INJECTION;  Surgeon: Lemar Lofty., MD;  Location: [facility] ENDOSCOPY;  Service: Gastroenterology;;   teeth extractions     TOE FUSION  1985   left great toe   TONSILLECTOMY AND ADENOIDECTOMY  1945   TOTAL HIP ARTHROPLASTY Left 11/09/2016   Procedure: LEFT TOTAL HIP ARTHROPLASTY ANTERIOR APPROACH;  Surgeon: Kathryne Hitch, MD;  Location: [facility] OR;  Service: Orthopedics;  Laterality: Left;   TOTAL HIP ARTHROPLASTY Right 09/13/2017   Procedure: RIGHT TOTAL HIP ARTHROPLASTY ANTERIOR APPROACH;  Surgeon: Kathryne Hitch, MD;  Location: [facility] OR;  Service: Orthopedics;  Laterality: Right;   UPPER ESOPHAGEAL ENDOSCOPIC ULTRASOUND (EUS) N/A 09/10/2019   Procedure: UPPER ESOPHAGEAL  ENDOSCOPIC ULTRASOUND (EUS);  Surgeon: Lemar Lofty., MD;  Location: [facility] ENDOSCOPY;  Service: Gastroenterology;  Laterality: N/A;   VASECTOMY     WISDOM TOOTH EXTRACTION Right 02/26/2021   Patient Active Problem List   Diagnosis Date Noted   Gait abnormality 05/12/2022   Duodenal adenoma 07/15/2019   Duodenal nodule 07/15/2019    Compression, esophagus    Stomach irritation    Gastric polyp    Esophageal dysphagia 03/07/2019   Status post total replacement of right hip 09/13/2017   Essential hypertension 08/10/2017   Preoperative cardiovascular examination 08/10/2017   Unilateral primary osteoarthritis, right hip 07/13/2017   Status post total replacement of left hip 11/09/2016   Pain of left hip joint 10/18/2016   Unilateral primary osteoarthritis, left hip 10/18/2016   Lumbar spondylosis 07/19/2016   Healthcare maintenance 02/19/2016   Rash 02/19/2016   Hyperglycemia 02/19/2016   Advance care planning 01/06/2014   Osteoarthritis, hip, bilateral 10/31/2012   Medicare annual wellness visit, subsequent 10/24/2012   Left hip pain 10/04/2012   Elevated PSA 10/06/2011   Hyperlipidemia    Kidney stone    History of hepatitis C, previously treated with Harvoni    FH: CAD (coronary artery disease) 11/27/2009    PCP: Joaquim Nam, MD   REFERRING PROVIDER: Joaquim Nam, MD   REFERRING DIAG:  R26.9 (ICD-10-CM) - Gait abnormality  M25.569 (ICD-10-CM) - Knee pain, unspecified chronicity, unspecified laterality    THERAPY DIAG:  Gait abnormality  Knee pain, unspecified chronicity, unspecified laterality  Rationale for Evaluation and Treatment: Rehabilitation  ONSET DATE: 5 years   SUBJECTIVE:   SUBJECTIVE STATEMENT: Pt reports R knee pain for the last 5 years. States that stairs are painful. Has also noted intermittent L foot drag/catching 1-2 times a week. No fall but mild LOB. Pt also states pain the R hip following tennis and pickle ball.     PERTINENT HISTORY: Bil hip replacement in 2018-2019  PAIN:  Are you having pain? No  PRECAUTIONS: None  WEIGHT BEARING RESTRICTIONS: No  FALLS:  Has patient fallen in last 6 months? 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 Ara Kussmaul MD VISIT: April 2025  OBJECTIVE:   DIAGNOSTIC FINDINGS:   Xray  Right knee AP lateral views: Medial lateral joint lines well-preserved.   Patellofemoral joint with some mild arthritic changes.  Otherwise no bony  abnormalities no acute findings.   AP pelvis: Status post bilateral total hip arthroplasties.  Components are  well-seated.  No acute findings.  Both hips are well located.    PATIENT SURVEYS:  LEFS 77/80 FOTO 69   COGNITION: Overall cognitive status: Within functional limits for tasks assessed     SENSATION: WFL  EDEMA:    MUSCLE LENGTH: Hamstrings: Right 131 deg; Left 136 deg Thomas test: Right 136 deg; Left 121 deg  POSTURE: rounded shoulders  PALPATION: Mild   LOWER EXTREMITY ROM:  Active ROM Right eval Left eval  Hip flexion    Hip extension 10 5  Hip abduction    Hip adduction    Hip internal rotation    Hip external rotation    Knee flexion prone:83 Prone:93  Knee extension  Ankle dorsiflexion    Ankle plantarflexion    Ankle inversion    Ankle eversion     (Blank rows = not tested)  LOWER EXTREMITY MMT:  MMT Right eval Left eval  Hip flexion 4+ 4+  Hip extension 4 4+  Hip abduction 4+ 5  Hip adduction 5 5  Hip internal rotation 4+ 4+  Hip external rotation 4+ 4+  Knee flexion 5 5  Knee extension 5 5  Ankle dorsiflexion 5 4+  Ankle plantarflexion 5 5  Ankle inversion    Ankle eversion     (Blank rows = not tested)  LOWER EXTREMITY SPECIAL TESTS:  Knee special tests: Anterior drawer test: negative, Posterior drawer test: negative, Apley's compression test: negative, Pivot shift test: negative, Patellafemoral grind test: negative, and Step up/down test: positive   FUNCTIONAL TESTS:  6 minute walk  test: 1680ft 10 meter walk test: 6.6 sec  SLS LLE 17sec RLE 29sec   GAIT: Distance walked: 163ft Assistive device utilized: None Level of assistance: Complete Independence Comments: mild foot slap on the LLE decreased hip extension on BLE    TODAY'S TREATMENT:                                                                                                                              DATE:   Octane. 2 min level 5, 2 min level 6, 1 min level 2 cool down   Therex:  Seats heel/calf raise 7.5 # ankle weights  LAQ x 12 BLE 7.5 # ankle weights  Prone hip extension: 2 x 10 BLE  Prone HS curl AROM x 5 Bil and then 7.5 # ankle weight x 10 bil  Sidelying hip abduction x 12 bil  Sidelying single limb clamshell. X 12 bil   Manual:  STM to bil gluteals and piriformis with TP release x 8 minutes.      PATIENT EDUCATION:  Education details: POC orientation to facility  Person educated: Patient Education method: Explanation Education comprehension: verbalized understanding  HOME EXERCISE PROGRAM: SLS 5 x 15 sec  Prone hip extension x 10  Ankle DF seated x 10  Piriformis stretch. 30 sec hold x 3 bil   ASSESSMENT:  CLINICAL IMPRESSION: Patient presented with excellent motivation for PT treatment on this day. Manual therapy to multiple trigger points in gluteal region. Noted to have limited hip extension on BLE L>R. Treatment focused on focal weakness in BLE. Pt would benefit from PT for pain management and improved strengthening in BLE to improve QoL and allow return to PLOF.   OBJECTIVE IMPAIRMENTS: cardiopulmonary status limiting activity, decreased balance, difficulty walking, decreased ROM, decreased strength, increased fascial restrictions, impaired perceived functional ability, and impaired flexibility.   ACTIVITY LIMITATIONS: squatting, dressing, and locomotion level  PARTICIPATION LIMITATIONS: community activity and yard work  PERSONAL FACTORS: Age are also affecting  patient's functional outcome.   REHAB POTENTIAL: Excellent  CLINICAL DECISION MAKING: Stable/uncomplicated  EVALUATION COMPLEXITY: Low  GOALS:  SHORT TERM GOALS: Target date: 07/01/2022    Patient will be independent in home exercise program to improve strength/mobility for better functional independence with ADLs. Baseline: to be given at second treatment Goal status: INITIAL   LONG TERM GOALS: Target date: 08/13/2022    Patient will increase FOTO score to equal to or greater than   74  to demonstrate statistically significant improvement in mobility and quality of life.  Baseline: 69 Goal status: INITIAL  2.  Patient will increase R hip and L ankle strength to match LLE to improve symmetry of movement improve function with dynamic activities such pickle ball and tennis  Baseline: 4/5 to 4+/5 Goal status: INITIAL  3.  Patient will increase 6 min walk test by >282ft to demonstrate decreased fall risk and improved independence during functional activities Baseline: 1626ft Goal status: INITIAL  4.  Patient will increase SLS on BLE to >30 sec to demonstrate improve function with dynamic tasks including walking on trail and dressing  Baseline: L 17 sec and R 29 sec  Goal status: INITIAL    PLAN:  PT FREQUENCY: 1-2x/week  PT DURATION: 12 weeks  PLANNED INTERVENTIONS: Therapeutic exercises, Therapeutic activity, Neuromuscular re-education, Balance training, Gait training, Patient/Family education, Self Care, Joint mobilization, Joint manipulation, Stair training, Dry Needling, Spinal manipulation, Spinal mobilization, Taping, and Manual therapy  PLAN FOR NEXT SESSION:   BLE strengthening. L ankle DF strengthening. Manual therapy for HS, Quad tightness and trigger point release.    Golden Pop, PT 05/27/2022, 7:58 AM

## 2022-05-31 ENCOUNTER — Ambulatory Visit: Payer: Medicare Other | Admitting: Physical Therapy

## 2022-06-02 ENCOUNTER — Ambulatory Visit: Payer: Medicare Other | Admitting: Physical Therapy

## 2022-06-02 DIAGNOSIS — M25569 Pain in unspecified knee: Secondary | ICD-10-CM

## 2022-06-02 DIAGNOSIS — R269 Unspecified abnormalities of gait and mobility: Secondary | ICD-10-CM | POA: Diagnosis not present

## 2022-06-02 DIAGNOSIS — M25561 Pain in right knee: Secondary | ICD-10-CM | POA: Diagnosis not present

## 2022-06-02 DIAGNOSIS — M25551 Pain in right hip: Secondary | ICD-10-CM | POA: Diagnosis not present

## 2022-06-02 NOTE — Therapy (Signed)
OUTPATIENT PHYSICAL THERAPY LOWER EXTREMITY EVALUATION   Patient Name: Victor Ball MRN: [id number] DOB:July 16, 1940, 82 y.o., male Today's Date: 06/02/2022  END OF SESSION:  PT End of Session - 06/02/22 0954     Visit Number 3    Number of Visits 24    Date for PT Re-Evaluation 08/12/22    Progress Note Due on Visit 10    PT Start Time 0849    PT Stop Time 0930    PT Time Calculation (min) 41 min    Activity Tolerance Patient tolerated treatment well    Behavior During Therapy WFL for tasks assessed/performed              Past Medical History:  Diagnosis Date   Duodenal adenoma    duodenal nodule   Hepatitis C, chronic (HCC)    treated prev with Harvoni, tested and cleared   History of kidney stones    passed stone   Hyperlipidemia    Hypertension    Wears glasses    Past Surgical History:  Procedure Laterality Date   APPENDECTOMY  1948   BIOPSY  10/30/2020   Procedure: BIOPSY;  Surgeon: Lemar Lofty., MD;  Location: [facility] ENDOSCOPY;  Service: Gastroenterology;;   ENDOSCOPIC MUCOSAL RESECTION N/A 09/10/2019   Procedure: ENDOSCOPIC MUCOSAL RESECTION;  Surgeon: Lemar Lofty., MD;  Location: [facility] ENDOSCOPY;  Service: Gastroenterology;  Laterality: N/A;   ENTEROSCOPY N/A 10/30/2020   Procedure: ENTEROSCOPY;  Surgeon: Meridee Score Netty Starring., MD;  Location: [facility] ENDOSCOPY;  Service: Gastroenterology;  Laterality: N/A;   ESOPHAGEAL DILATION  09/10/2019   Procedure: ESOPHAGEAL DILATION;  Surgeon: Meridee Score Netty Starring., MD;  Location: [facility] ENDOSCOPY;  Service: Gastroenterology;;   ESOPHAGOGASTRODUODENOSCOPY (EGD) WITH PROPOFOL N/A 06/04/2019   Procedure: ESOPHAGOGASTRODUODENOSCOPY (EGD) WITH PROPOFOL;  Surgeon: Pasty Spillers, MD;  Location: [facility] ENDOSCOPY;  Service: Endoscopy;  Laterality: N/A;   ESOPHAGOGASTRODUODENOSCOPY (EGD) WITH PROPOFOL N/A 09/10/2019   Procedure: ESOPHAGOGASTRODUODENOSCOPY (EGD) WITH PROPOFOL;  Surgeon: Meridee Score Netty Starring.,  MD;  Location: [facility] ENDOSCOPY;  Service: Gastroenterology;  Laterality: N/A;   HEMOSTASIS CLIP PLACEMENT  09/10/2019   Procedure: HEMOSTASIS CLIP PLACEMENT;  Surgeon: Lemar Lofty., MD;  Location: [facility] ENDOSCOPY;  Service: Gastroenterology;;   JOINT REPLACEMENT  10/18 and 8/19   LIVER BIOPSY     POLYPECTOMY  09/10/2019   Procedure: POLYPECTOMY;  Surgeon: Meridee Score Netty Starring., MD;  Location: [facility] ENDOSCOPY;  Service: Gastroenterology;;   POLYPECTOMY  10/30/2020   Procedure: POLYPECTOMY;  Surgeon: Lemar Lofty., MD;  Location: [facility] ENDOSCOPY;  Service: Gastroenterology;;   PROSTATE BIOPSY     neg, x2   SUBMUCOSAL LIFTING INJECTION  09/10/2019   Procedure: SUBMUCOSAL LIFTING INJECTION;  Surgeon: Lemar Lofty., MD;  Location: [facility] ENDOSCOPY;  Service: Gastroenterology;;   SUBMUCOSAL TATTOO INJECTION  09/10/2019   Procedure: SUBMUCOSAL TATTOO INJECTION;  Surgeon: Lemar Lofty., MD;  Location: [facility] ENDOSCOPY;  Service: Gastroenterology;;   teeth extractions     TOE FUSION  1985   left great toe   TONSILLECTOMY AND ADENOIDECTOMY  1945   TOTAL HIP ARTHROPLASTY Left 11/09/2016   Procedure: LEFT TOTAL HIP ARTHROPLASTY ANTERIOR APPROACH;  Surgeon: Kathryne Hitch, MD;  Location: [facility] OR;  Service: Orthopedics;  Laterality: Left;   TOTAL HIP ARTHROPLASTY Right 09/13/2017   Procedure: RIGHT TOTAL HIP ARTHROPLASTY ANTERIOR APPROACH;  Surgeon: Kathryne Hitch, MD;  Location: [facility] OR;  Service: Orthopedics;  Laterality: Right;   UPPER ESOPHAGEAL ENDOSCOPIC ULTRASOUND (EUS) N/A 09/10/2019   Procedure: UPPER ESOPHAGEAL  ENDOSCOPIC ULTRASOUND (EUS);  Surgeon: Lemar Lofty., MD;  Location: [facility] ENDOSCOPY;  Service: Gastroenterology;  Laterality: N/A;   VASECTOMY     WISDOM TOOTH EXTRACTION Right 02/26/2021   Patient Active Problem List   Diagnosis Date Noted   Gait abnormality 05/12/2022   Duodenal adenoma 07/15/2019   Duodenal nodule 07/15/2019    Compression, esophagus    Stomach irritation    Gastric polyp    Esophageal dysphagia 03/07/2019   Status post total replacement of right hip 09/13/2017   Essential hypertension 08/10/2017   Preoperative cardiovascular examination 08/10/2017   Unilateral primary osteoarthritis, right hip 07/13/2017   Status post total replacement of left hip 11/09/2016   Pain of left hip joint 10/18/2016   Unilateral primary osteoarthritis, left hip 10/18/2016   Lumbar spondylosis 07/19/2016   Healthcare maintenance 02/19/2016   Rash 02/19/2016   Hyperglycemia 02/19/2016   Advance care planning 01/06/2014   Osteoarthritis, hip, bilateral 10/31/2012   Medicare annual wellness visit, subsequent 10/24/2012   Left hip pain 10/04/2012   Elevated PSA 10/06/2011   Hyperlipidemia    Kidney stone    History of hepatitis C, previously treated with Harvoni    FH: CAD (coronary artery disease) 11/27/2009    PCP: Joaquim Nam, MD   REFERRING PROVIDER: Joaquim Nam, MD   REFERRING DIAG:  R26.9 (ICD-10-CM) - Gait abnormality  M25.569 (ICD-10-CM) - Knee pain, unspecified chronicity, unspecified laterality    THERAPY DIAG:  Gait abnormality  Knee pain, unspecified chronicity, unspecified laterality  Rationale for Evaluation and Treatment: Rehabilitation  ONSET DATE: 5 years   SUBJECTIVE:   SUBJECTIVE STATEMENT: Pt reports that he is doing well. States that he has been doing HEP daily with emphasis on gluteal strengthening.  Pt has questions about repetition for gluteal/piriformis stretches.   PERTINENT HISTORY: Bil hip replacement in 2018-2019  PAIN:  Are you having pain? No  PRECAUTIONS: None  WEIGHT BEARING RESTRICTIONS: No  FALLS:  Has patient fallen in last 6 months? 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 Ara Kussmaul MD VISIT: April 2025  OBJECTIVE:   DIAGNOSTIC FINDINGS:   Xray  Right knee AP lateral views: Medial lateral joint lines well-preserved.   Patellofemoral joint with some mild arthritic changes.  Otherwise no bony  abnormalities no acute findings.   AP pelvis: Status post bilateral total hip arthroplasties.  Components are  well-seated.  No acute findings.  Both hips are well located.    PATIENT SURVEYS:  LEFS 77/80 FOTO 69   COGNITION: Overall cognitive status: Within functional limits for tasks assessed     SENSATION: WFL  EDEMA:    MUSCLE LENGTH: Hamstrings: Right 131 deg; Left 136 deg Thomas test: Right 136 deg; Left 121 deg  POSTURE: rounded shoulders  PALPATION: Mild   LOWER EXTREMITY ROM:  Active ROM Right eval Left eval  Hip flexion    Hip extension 10 5  Hip abduction    Hip adduction    Hip internal rotation    Hip external rotation    Knee flexion prone:83 Prone:93  Knee extension    Ankle dorsiflexion    Ankle plantarflexion    Ankle inversion    Ankle  eversion     (Blank rows = not tested)  LOWER EXTREMITY MMT:  MMT Right eval Left eval  Hip flexion 4+ 4+  Hip extension 4 4+  Hip abduction 4+ 5  Hip adduction 5 5  Hip internal rotation 4+ 4+  Hip external rotation 4+ 4+  Knee flexion 5 5  Knee extension 5 5  Ankle dorsiflexion 5 4+  Ankle plantarflexion 5 5  Ankle inversion    Ankle eversion     (Blank rows = not tested)  LOWER EXTREMITY SPECIAL TESTS:  Knee special tests: Anterior drawer test: negative, Posterior drawer test: negative, Apley's compression test: negative, Pivot shift test: negative, Patellafemoral grind test: negative, and Step up/down test: positive   FUNCTIONAL TESTS:  6 minute walk test: 1643ft 10 meter walk test: 6.6 sec   SLS LLE 17sec RLE 29sec   GAIT: Distance walked: 1674ft Assistive device utilized: None Level of assistance: Complete Independence Comments: mild foot slap on the LLE decreased hip extension on BLE    TODAY'S TREATMENT:                                                                                                                              DATE:   Octane. 5 min level 6, 1 min level 2 cool down   Therex:  Prone hip extension: 2 x 10 BLE  Bridge x 10  Standing calf stretch 2 x 30 sec bil.  Standing hip abduction GTB x 15 bil  Standing hip flexion with band on Bil feet x 15 bil  Standing hip extension GTB below knee x 12  Sitting piriformis stretch 2 x 30 sec bil  Sitting HS stretch 2 x 30sec bil  Education for HEP to reduce stretches for HS and gluteals to 4 reps per leg and to perform 3 sets throughout entire day rather than consecutively.    Manual:  STM to bil gluteals, HS, and piriformis with therastick x 8 min with emphasis on L side piriformis for trigger point release.       PATIENT EDUCATION:  Education details: POC orientation to facility  Person educated: Patient Education method: Explanation Education comprehension: verbalized understanding  HOME EXERCISE PROGRAM: SLS 5 x 15 sec  Prone hip extension x 10  Ankle DF seated x 10  Piriformis stretch. 30 sec hold x 3 bil   ASSESSMENT:  CLINICAL IMPRESSION: Patient presented with excellent motivation for PT treatment on this day. PT educated pt in self stretches to improve muscle extensibility and reduce tension for tightness in bil HS and gluteals. Therex for focal weakness in bil hips in all directions. Pt able to demonstrate improved understanding of HEP and decreased trunkal compensation for hip strengthening exercises. Pt would benefit from PT for pain management and improved strengthening in BLE to improve QoL and allow return to PLOF.   OBJECTIVE IMPAIRMENTS: cardiopulmonary status limiting activity,  decreased balance, difficulty walking, decreased ROM, decreased strength, increased fascial  restrictions, impaired perceived functional ability, and impaired flexibility.   ACTIVITY LIMITATIONS: squatting, dressing, and locomotion level  PARTICIPATION LIMITATIONS: community activity and yard work  PERSONAL FACTORS: Age are also affecting patient's functional outcome.   REHAB POTENTIAL: Excellent  CLINICAL DECISION MAKING: Stable/uncomplicated  EVALUATION COMPLEXITY: Low   GOALS:  SHORT TERM GOALS: Target date: 07/01/2022    Patient will be independent in home exercise program to improve strength/mobility for better functional independence with ADLs. Baseline: to be given at second treatment Goal status: INITIAL   LONG TERM GOALS: Target date: 08/13/2022    Patient will increase FOTO score to equal to or greater than   74  to demonstrate statistically significant improvement in mobility and quality of life.  Baseline: 69 Goal status: INITIAL  2.  Patient will increase R hip and L ankle strength to match LLE to improve symmetry of movement improve function with dynamic activities such pickle ball and tennis  Baseline: 4/5 to 4+/5 Goal status: INITIAL  3.  Patient will increase 6 min walk test by >282ft to demonstrate decreased fall risk and improved independence during functional activities Baseline: 1695ft Goal status: INITIAL  4.  Patient will increase SLS on BLE to >30 sec to demonstrate improve function with dynamic tasks including walking on trail and dressing  Baseline: L 17 sec and R 29 sec  Goal status: INITIAL    PLAN:  PT FREQUENCY: 1-2x/week  PT DURATION: 12 weeks  PLANNED INTERVENTIONS: Therapeutic exercises, Therapeutic activity, Neuromuscular re-education, Balance training, Gait training, Patient/Family education, Self Care, Joint mobilization, Joint manipulation, Stair training, Dry Needling, Spinal manipulation, Spinal mobilization, Taping, and Manual  therapy  PLAN FOR NEXT SESSION:   BLE strengthening. L ankle DF strengthening.  Manual therapy for HS, Quad tightness and trigger point release.    Golden Pop, PT 06/02/2022, 10:10 AM

## 2022-06-07 ENCOUNTER — Ambulatory Visit: Payer: Medicare Other | Admitting: Physical Therapy

## 2022-06-08 ENCOUNTER — Ambulatory Visit: Payer: Medicare Other | Admitting: Urology

## 2022-06-08 VITALS — BP 125/70 | HR 66 | Ht 74.5 in | Wt 206.0 lb

## 2022-06-08 DIAGNOSIS — N400 Enlarged prostate without lower urinary tract symptoms: Secondary | ICD-10-CM

## 2022-06-08 DIAGNOSIS — R972 Elevated prostate specific antigen [PSA]: Secondary | ICD-10-CM | POA: Diagnosis not present

## 2022-06-08 DIAGNOSIS — N401 Enlarged prostate with lower urinary tract symptoms: Secondary | ICD-10-CM

## 2022-06-08 NOTE — Progress Notes (Signed)
I,Amy L Pierron,acting as a scribe for Vanna Scotland, MD.,have documented all relevant documentation on the behalf of Vanna Scotland, MD,as directed by  Vanna Scotland, MD while in the presence of Vanna Scotland, MD.  06/08/2022 4:23 PM   Victor Ball [birth year] [id number]  Referring provider: Joaquim Nam, MD [address] [place],  [place] [address]  Chief Complaint  Patient presents with   Elevated PSA    HPI: 82 year-old male with a personal history of BPH and elevated PSA presents today for a follow-up.  He was last seen in 2021 and is referred back for elevated PSA. He's had two negative biopsies in the past. The first being 20+ years ago, and the second, 10+ years ago in [place]. He reports his PSA may have been 5 around that time but isn't completely sure.  He had a prostate MRI on 05/11/2019 that was fairly unremarkable other than BPH with prostamegaly. His prostate volume at the time was 170.  His most recent PSA was 14.13, up from a year ago when it was fairly stable around the 8-9 range.  He mentioned that running water makes him have the urge to urinate. He has nocturia about twice a week. He is concerned about the PSA number. He has lost 50 lbs and been able to keep it off.   PSA Trend: 02/26/2019       9.73 04/11/2019     12.0 09/25/2019        9.3 03/05/2020      8.56 04/15/2021      9.94 05/03/2022      14.13   PMH: Past Medical History:  Diagnosis Date   Duodenal adenoma    duodenal nodule   Hepatitis C, chronic (HCC)    treated prev with Harvoni, tested and cleared   History of kidney stones    passed stone   Hyperlipidemia    Hypertension    Wears glasses     Surgical History: Past Surgical History:  Procedure Laterality Date   APPENDECTOMY  1948   BIOPSY  10/30/2020   Procedure: BIOPSY;  Surgeon: Lemar Lofty., MD;  Location: [facility] ENDOSCOPY;  Service: Gastroenterology;;   ENDOSCOPIC MUCOSAL RESECTION N/A 09/10/2019    Procedure: ENDOSCOPIC MUCOSAL RESECTION;  Surgeon: Lemar Lofty., MD;  Location: [facility] ENDOSCOPY;  Service: Gastroenterology;  Laterality: N/A;   ENTEROSCOPY N/A 10/30/2020   Procedure: ENTEROSCOPY;  Surgeon: Meridee Score Netty Starring., MD;  Location: [facility] ENDOSCOPY;  Service: Gastroenterology;  Laterality: N/A;   ESOPHAGEAL DILATION  09/10/2019   Procedure: ESOPHAGEAL DILATION;  Surgeon: Meridee Score Netty Starring., MD;  Location: [facility] ENDOSCOPY;  Service: Gastroenterology;;   ESOPHAGOGASTRODUODENOSCOPY (EGD) WITH PROPOFOL N/A 06/04/2019   Procedure: ESOPHAGOGASTRODUODENOSCOPY (EGD) WITH PROPOFOL;  Surgeon: Pasty Spillers, MD;  Location: [facility] ENDOSCOPY;  Service: Endoscopy;  Laterality: N/A;   ESOPHAGOGASTRODUODENOSCOPY (EGD) WITH PROPOFOL N/A 09/10/2019   Procedure: ESOPHAGOGASTRODUODENOSCOPY (EGD) WITH PROPOFOL;  Surgeon: Meridee Score Netty Starring., MD;  Location: [facility] ENDOSCOPY;  Service: Gastroenterology;  Laterality: N/A;   HEMOSTASIS CLIP PLACEMENT  09/10/2019   Procedure: HEMOSTASIS CLIP PLACEMENT;  Surgeon: Lemar Lofty., MD;  Location: [facility] ENDOSCOPY;  Service: Gastroenterology;;   JOINT REPLACEMENT  10/18 and 8/19   LIVER BIOPSY     POLYPECTOMY  09/10/2019   Procedure: POLYPECTOMY;  Surgeon: Meridee Score Netty Starring., MD;  Location: [facility] ENDOSCOPY;  Service: Gastroenterology;;   POLYPECTOMY  10/30/2020   Procedure: POLYPECTOMY;  Surgeon: Lemar Lofty., MD;  Location: [facility] ENDOSCOPY;  Service:  Gastroenterology;;   PROSTATE BIOPSY     neg, x2   SUBMUCOSAL LIFTING INJECTION  09/10/2019   Procedure: SUBMUCOSAL LIFTING INJECTION;  Surgeon: Meridee Score Netty Starring., MD;  Location: [facility] ENDOSCOPY;  Service: Gastroenterology;;   SUBMUCOSAL TATTOO INJECTION  09/10/2019   Procedure: SUBMUCOSAL TATTOO INJECTION;  Surgeon: Lemar Lofty., MD;  Location: [facility] ENDOSCOPY;  Service: Gastroenterology;;   teeth extractions     TOE FUSION  1985   left great toe   TONSILLECTOMY AND  ADENOIDECTOMY  1945   TOTAL HIP ARTHROPLASTY Left 11/09/2016   Procedure: LEFT TOTAL HIP ARTHROPLASTY ANTERIOR APPROACH;  Surgeon: Kathryne Hitch, MD;  Location: [facility] OR;  Service: Orthopedics;  Laterality: Left;   TOTAL HIP ARTHROPLASTY Right 09/13/2017   Procedure: RIGHT TOTAL HIP ARTHROPLASTY ANTERIOR APPROACH;  Surgeon: Kathryne Hitch, MD;  Location: [facility] OR;  Service: Orthopedics;  Laterality: Right;   UPPER ESOPHAGEAL ENDOSCOPIC ULTRASOUND (EUS) N/A 09/10/2019   Procedure: UPPER ESOPHAGEAL ENDOSCOPIC ULTRASOUND (EUS);  Surgeon: Lemar Lofty., MD;  Location: [facility] ENDOSCOPY;  Service: Gastroenterology;  Laterality: N/A;   VASECTOMY     WISDOM TOOTH EXTRACTION Right 02/26/2021    Home Medications:  Allergies as of 06/08/2022   No Known Allergies      Medication List        Accurate as of Jun 08, 2022  4:23 PM. If you have any questions, ask your nurse or doctor.          atorvastatin 10 MG tablet Commonly known as: LIPITOR Take 1 tablet (10 mg total) by mouth daily.   lisinopril 5 MG tablet Commonly known as: ZESTRIL Take 1 tablet (5 mg total) by mouth daily.   OMEGA 3 500 PO Take by mouth.        Family History: Family History  Problem Relation Age of Onset   Heart disease Father    Diabetes Father    Heart attack Father    Celiac disease Sister    Lung cancer Sister    Heart disease Brother        MI then stents   Heart attack Brother    Cancer Brother    Diabetes Brother    Stroke Brother    Diabetes Brother    Cancer Paternal Aunt    Cancer Sister    Early death Sister    Early death Brother    Stroke Brother    Prostate cancer Neg Hx    Colon cancer Neg Hx    Esophageal cancer Neg Hx    Inflammatory bowel disease Neg Hx    Liver disease Neg Hx    Pancreatic cancer Neg Hx    Rectal cancer Neg Hx    Stomach cancer Neg Hx     Social History:  reports that he quit smoking about 49 years ago. His smoking use included  cigarettes. He has a 15.00 pack-year smoking history. He has never used smokeless tobacco. He reports current alcohol use of about 8.0 standard drinks of alcohol per week. He reports that he does not use drugs.   Physical Exam: BP 125/70   Pulse 66   Ht 6' 2.5" (1.892 m)   Wt 206 lb (93.4 kg)   BMI 26.10 kg/m   Constitutional:  Alert and oriented, No acute distress. HEENT: [place] AT, moist mucus membranes.  Trachea midline, no masses. Neurologic: Grossly intact, no focal deficits, moving all 4 extremities. Psychiatric: Normal mood and affect.   Assessment & Plan:  History of elevated PSA  -  We reviewed the implications of an elevated PSA and the uncertainty surrounding it. In general, a man's PSA increases with age and is produced by both normal and cancerous prostate tissue. Differential for elevated PSA is BPH, prostate cancer, infection, recent intercourse/ejaculation, prostate infarction, recent urethroscopic manipulation (foley placement/cystoscopy) and prostatitis. Management of an elevated PSA can include observation or prostate biopsy and we discussed this in detail.  -  Explained that since his prostate at the time of biopsy was 170 grams he has more prostate cells and each of those cells makes benign PSA. You have to correct the PSA for density. So you take the PSA number and divide it by the volume to get the density number. The difference between 9 adjusted by density and 13 or 14 adjusted by density is not a big change. Other things to consider are age, genetics, if symptoms are present, and daily fluctuations.   - Recommend getting a UA today to check for inflammation or infection. Repeat PSA in 6 weeks. If it is still high, would suggest a repeat MRI.   2. BPH  - Minimal symptoms, not bothersome.  Return in about 6 weeks (around 07/20/2022) for PSA.  [facility] [address] [place], [place] [address] [phone]

## 2022-06-09 ENCOUNTER — Ambulatory Visit: Payer: Medicare Other | Admitting: Physical Therapy

## 2022-06-09 DIAGNOSIS — R269 Unspecified abnormalities of gait and mobility: Secondary | ICD-10-CM | POA: Diagnosis not present

## 2022-06-09 DIAGNOSIS — M25569 Pain in unspecified knee: Secondary | ICD-10-CM

## 2022-06-09 DIAGNOSIS — M25551 Pain in right hip: Secondary | ICD-10-CM | POA: Diagnosis not present

## 2022-06-09 DIAGNOSIS — M25561 Pain in right knee: Secondary | ICD-10-CM | POA: Diagnosis not present

## 2022-06-09 LAB — MICROSCOPIC EXAMINATION

## 2022-06-09 LAB — URINALYSIS, COMPLETE
Bilirubin, UA: NEGATIVE
Glucose, UA: NEGATIVE
Leukocytes,UA: NEGATIVE
Nitrite, UA: NEGATIVE
Protein,UA: NEGATIVE
RBC, UA: NEGATIVE
Specific Gravity, UA: 1.02 (ref 1.005–1.030)
Urobilinogen, Ur: 1 mg/dL (ref 0.2–1.0)
pH, UA: 7 (ref 5.0–7.5)

## 2022-06-09 NOTE — Therapy (Signed)
OUTPATIENT PHYSICAL THERAPY LOWER EXTREMITY EVALUATION   Patient Name: Victor Ball VI MRN: [id number] DOB:1940/02/23, 82 y.o., male Today's Date: 06/09/2022  END OF SESSION:  PT End of Session - 06/09/22 0924     Visit Number 4    Number of Visits 24    Date for PT Re-Evaluation 08/12/22    Progress Note Due on Visit 10    PT Start Time 0845    Activity Tolerance Patient tolerated treatment well    Behavior During Therapy [facility] for tasks assessed/performed              Past Medical History:  Diagnosis Date   Duodenal adenoma    duodenal nodule   Hepatitis C, chronic (HCC)    treated prev with Harvoni, tested and cleared   History of kidney stones    passed stone   Hyperlipidemia    Hypertension    Wears glasses    Past Surgical History:  Procedure Laterality Date   APPENDECTOMY  1948   BIOPSY  10/30/2020   Procedure: BIOPSY;  Surgeon: Lemar Lofty., MD;  Location: [facility] ENDOSCOPY;  Service: Gastroenterology;;   ENDOSCOPIC MUCOSAL RESECTION N/A 09/10/2019   Procedure: ENDOSCOPIC MUCOSAL RESECTION;  Surgeon: Lemar Lofty., MD;  Location: [facility] ENDOSCOPY;  Service: Gastroenterology;  Laterality: N/A;   ENTEROSCOPY N/A 10/30/2020   Procedure: ENTEROSCOPY;  Surgeon: Meridee Score Netty Starring., MD;  Location: [facility] ENDOSCOPY;  Service: Gastroenterology;  Laterality: N/A;   ESOPHAGEAL DILATION  09/10/2019   Procedure: ESOPHAGEAL DILATION;  Surgeon: Meridee Score Netty Starring., MD;  Location: [facility] ENDOSCOPY;  Service: Gastroenterology;;   ESOPHAGOGASTRODUODENOSCOPY (EGD) WITH PROPOFOL N/A 06/04/2019   Procedure: ESOPHAGOGASTRODUODENOSCOPY (EGD) WITH PROPOFOL;  Surgeon: Pasty Spillers, MD;  Location: [facility] ENDOSCOPY;  Service: Endoscopy;  Laterality: N/A;   ESOPHAGOGASTRODUODENOSCOPY (EGD) WITH PROPOFOL N/A 09/10/2019   Procedure: ESOPHAGOGASTRODUODENOSCOPY (EGD) WITH PROPOFOL;  Surgeon: Meridee Score Netty Starring., MD;  Location: [facility] ENDOSCOPY;  Service: Gastroenterology;   Laterality: N/A;   HEMOSTASIS CLIP PLACEMENT  09/10/2019   Procedure: HEMOSTASIS CLIP PLACEMENT;  Surgeon: Lemar Lofty., MD;  Location: [facility] ENDOSCOPY;  Service: Gastroenterology;;   JOINT REPLACEMENT  10/18 and 8/19   LIVER BIOPSY     POLYPECTOMY  09/10/2019   Procedure: POLYPECTOMY;  Surgeon: Meridee Score Netty Starring., MD;  Location: [facility] ENDOSCOPY;  Service: Gastroenterology;;   POLYPECTOMY  10/30/2020   Procedure: POLYPECTOMY;  Surgeon: Lemar Lofty., MD;  Location: [facility] ENDOSCOPY;  Service: Gastroenterology;;   PROSTATE BIOPSY     neg, x2   SUBMUCOSAL LIFTING INJECTION  09/10/2019   Procedure: SUBMUCOSAL LIFTING INJECTION;  Surgeon: Lemar Lofty., MD;  Location: [facility] ENDOSCOPY;  Service: Gastroenterology;;   SUBMUCOSAL TATTOO INJECTION  09/10/2019   Procedure: SUBMUCOSAL TATTOO INJECTION;  Surgeon: Lemar Lofty., MD;  Location: [facility] ENDOSCOPY;  Service: Gastroenterology;;   teeth extractions     TOE FUSION  1985   left great toe   TONSILLECTOMY AND ADENOIDECTOMY  1945   TOTAL HIP ARTHROPLASTY Left 11/09/2016   Procedure: LEFT TOTAL HIP ARTHROPLASTY ANTERIOR APPROACH;  Surgeon: Kathryne Hitch, MD;  Location: [facility] OR;  Service: Orthopedics;  Laterality: Left;   TOTAL HIP ARTHROPLASTY Right 09/13/2017   Procedure: RIGHT TOTAL HIP ARTHROPLASTY ANTERIOR APPROACH;  Surgeon: Kathryne Hitch, MD;  Location: [facility] OR;  Service: Orthopedics;  Laterality: Right;   UPPER ESOPHAGEAL ENDOSCOPIC ULTRASOUND (EUS) N/A 09/10/2019   Procedure: UPPER ESOPHAGEAL ENDOSCOPIC ULTRASOUND (EUS);  Surgeon: Lemar Lofty., MD;  Location: [facility] ENDOSCOPY;  Service: Gastroenterology;  Laterality: N/A;   VASECTOMY     WISDOM TOOTH EXTRACTION Right 02/26/2021   Patient Active Problem List   Diagnosis Date Noted   Gait abnormality 05/12/2022   Duodenal adenoma 07/15/2019   Duodenal nodule 07/15/2019   Compression, esophagus    Stomach irritation    Gastric polyp     Esophageal dysphagia 03/07/2019   Status post total replacement of right hip 09/13/2017   Essential hypertension 08/10/2017   Preoperative cardiovascular examination 08/10/2017   Unilateral primary osteoarthritis, right hip 07/13/2017   Status post total replacement of left hip 11/09/2016   Pain of left hip joint 10/18/2016   Unilateral primary osteoarthritis, left hip 10/18/2016   Lumbar spondylosis 07/19/2016   Healthcare maintenance 02/19/2016   Rash 02/19/2016   Hyperglycemia 02/19/2016   Advance care planning 01/06/2014   Osteoarthritis, hip, bilateral 10/31/2012   Medicare annual wellness visit, subsequent 10/24/2012   Left hip pain 10/04/2012   Elevated PSA 10/06/2011   Hyperlipidemia    Kidney stone    History of hepatitis C, previously treated with Harvoni    FH: CAD (coronary artery disease) 11/27/2009    PCP: Joaquim Nam, MD   REFERRING PROVIDER: Joaquim Nam, MD   REFERRING DIAG:  R26.9 (ICD-10-CM) - Gait abnormality  M25.569 (ICD-10-CM) - Knee pain, unspecified chronicity, unspecified laterality    THERAPY DIAG:  Gait abnormality  Knee pain, unspecified chronicity, unspecified laterality  Rationale for Evaluation and Treatment: Rehabilitation  ONSET DATE: 5 years   SUBJECTIVE:   SUBJECTIVE STATEMENT: Pt reports that he is doing well. States that he has been doing HEP daily with emphasis on gluteal strengthening.  Pt has questions about repetition for gluteal/piriformis stretches.   PERTINENT HISTORY: Bil hip replacement in 2018-2019  PAIN:  Are you having pain? No  PRECAUTIONS: None  WEIGHT BEARING RESTRICTIONS: No  FALLS:  Has patient fallen in last 6 months? 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 Ara Kussmaul MD VISIT: April 2025  OBJECTIVE:   DIAGNOSTIC FINDINGS:   Xray  Right knee AP lateral views: Medial lateral joint lines well-preserved.   Patellofemoral joint with some mild arthritic changes.  Otherwise no bony  abnormalities no acute findings.   AP pelvis: Status post bilateral total hip arthroplasties.  Components are  well-seated.  No acute findings.  Both hips are well located.    PATIENT SURVEYS:  LEFS 77/80 FOTO 69   COGNITION: Overall cognitive status: Within functional limits for tasks assessed     SENSATION: WFL  EDEMA:    MUSCLE LENGTH: Hamstrings: Right 131 deg; Left 136 deg Thomas test: Right 136 deg; Left 121 deg  POSTURE: rounded shoulders  PALPATION: Mild   LOWER EXTREMITY ROM:  Active ROM Right eval Left eval  Hip flexion    Hip extension 10 5  Hip abduction    Hip adduction    Hip internal rotation    Hip external rotation    Knee flexion prone:83 Prone:93  Knee extension    Ankle dorsiflexion    Ankle plantarflexion    Ankle inversion    Ankle eversion     (Blank rows = not tested)  LOWER EXTREMITY MMT:  MMT Right  eval Left eval  Hip flexion 4+ 4+  Hip extension 4 4+  Hip abduction 4+ 5  Hip adduction 5 5  Hip internal rotation 4+ 4+  Hip external rotation 4+ 4+  Knee flexion 5 5  Knee extension 5 5  Ankle dorsiflexion 5 4+  Ankle plantarflexion 5 5  Ankle inversion    Ankle eversion     (Blank rows = not tested)  LOWER EXTREMITY SPECIAL TESTS:  Knee special tests: Anterior drawer test: negative, Posterior drawer test: negative, Apley's compression test: negative, Pivot shift test: negative, Patellafemoral grind test: negative, and Step up/down test: positive   FUNCTIONAL TESTS:  6 minute walk test: 1676ft 10 meter walk test: 6.6 sec  SLS LLE 17sec RLE 29sec   GAIT: Distance walked:  1671ft Assistive device utilized: None Level of assistance: Complete Independence Comments: mild foot slap on the LLE decreased hip extension on BLE    TODAY'S TREATMENT:                                                                                                                              DATE:   Octane. 2.5 min forward/2.5 min reverse random resistance with cues from PT  to keep consistent RPM through variable resistance. Therapeutic rest break between bouts.  Seated figure 4 stretch 2 x 45 sec bil  Standing calf/hip flexor stretch 2 x 45 sec. Pt reports feeling stretch primarily in gastroc. Supine hip flexor stretch of Edge of table x 1 min bil.  Bridge with 3 sec hold x 10 LTR x 10 bil with 3 sec hold   Manual:  STM to R side gluteals, piriformis x 8 min with emphasis on R side glute med for trigger point release.       PATIENT EDUCATION:  Education details: POC orientation to facility  Person educated: Patient Education method: Explanation Education comprehension: verbalized understanding  HOME EXERCISE PROGRAM:  Access Code: [email or web address] URL: [email or web address] Date: 06/09/2022 Prepared by: Grier Rocher  Exercises - Supine Bridge  - 1 x daily - 5 x weekly - 2 sets - 10 reps - 3 hold - Supine Lower Trunk Rotation  - 1 x daily - 5 x weekly - 2 sets - 10 reps - 3 hold  SLS 5 x 15 sec  Ankle DF seated x 10  Piriformis stretch. 30 sec hold x 3 bil   ASSESSMENT:  CLINICAL IMPRESSION: Patient presented with excellent motivation for PT treatment on this day. PT educated pt in self stretches to improve muscle extensibility and reduce tension for tightness in bil gluteals and hip flexors. Therex for focal weakness in bil hips in all directions. HEP adjusted to reduce stress on low back and SI joint while activating gluteals. Pt would benefit from PT for pain management and improved strengthening in BLE to improve QoL and allow return to PLOF.    OBJECTIVE IMPAIRMENTS: cardiopulmonary status limiting activity, decreased balance, difficulty walking, decreased ROM, decreased  strength, increased fascial restrictions, impaired perceived functional ability, and impaired flexibility.   ACTIVITY LIMITATIONS: squatting, dressing, and locomotion level  PARTICIPATION LIMITATIONS: community activity and yard work  PERSONAL FACTORS: Age are also affecting patient's functional outcome.   REHAB POTENTIAL: Excellent  CLINICAL DECISION MAKING: Stable/uncomplicated  EVALUATION COMPLEXITY: Low   GOALS:  SHORT TERM GOALS: Target date: 07/01/2022    Patient will be independent in home exercise program to improve strength/mobility for better functional independence with ADLs. Baseline: to be given at second treatment Goal status: INITIAL   LONG TERM GOALS: Target date: 08/13/2022    Patient will increase FOTO score to equal to or greater than   74  to demonstrate statistically significant improvement in mobility and quality of life.  Baseline: 69 Goal status: INITIAL  2.  Patient will increase R hip and L ankle strength to match LLE to improve symmetry of movement improve function with dynamic activities such pickle ball and tennis  Baseline: 4/5 to 4+/5 Goal status: INITIAL  3.  Patient will increase 6 min walk test by >271ft to demonstrate decreased fall risk and improved independence during functional activities Baseline: 1644ft Goal status: INITIAL  4.  Patient will increase SLS on BLE to >30 sec to demonstrate improve function with dynamic tasks including walking on trail and dressing  Baseline: L 17 sec and R 29 sec  Goal status: INITIAL    PLAN:  PT FREQUENCY: 1-2x/week  PT DURATION: 12 weeks  PLANNED INTERVENTIONS: Therapeutic exercises, Therapeutic activity, Neuromuscular re-education, Balance training, Gait training, Patient/Family education, Self Care, Joint mobilization, Joint manipulation, Stair training, Dry  Needling, Spinal manipulation, Spinal mobilization, Taping, and Manual therapy  PLAN FOR NEXT SESSION:   BLE strengthening/stretching. L ankle DF strengthening.  Manual therapy for HS, Quad tightness and trigger point release.    Grier Rocher PT, DPT  Physical Therapist - [place]  [facility]  9:56 AM 06/09/22

## 2022-06-11 ENCOUNTER — Encounter: Payer: Self-pay | Admitting: Urology

## 2022-06-16 ENCOUNTER — Ambulatory Visit: Payer: Medicare Other | Admitting: Physical Therapy

## 2022-06-16 DIAGNOSIS — M25551 Pain in right hip: Secondary | ICD-10-CM | POA: Diagnosis not present

## 2022-06-16 DIAGNOSIS — M25569 Pain in unspecified knee: Secondary | ICD-10-CM

## 2022-06-16 DIAGNOSIS — M25561 Pain in right knee: Secondary | ICD-10-CM | POA: Diagnosis not present

## 2022-06-16 DIAGNOSIS — R269 Unspecified abnormalities of gait and mobility: Secondary | ICD-10-CM | POA: Diagnosis not present

## 2022-06-16 NOTE — Therapy (Signed)
OUTPATIENT PHYSICAL THERAPY LOWER EXTREMITY EVALUATION   Patient Name: Victor Ball MRN: [id number] DOB:06/08/1940, 82 y.o., male Today's Date: 06/16/2022  END OF SESSION:  PT End of Session - 06/16/22 1056     Visit Number 5    Number of Visits 24    Date for PT Re-Evaluation 08/12/22    Progress Note Due on Visit 10    PT Start Time 1057    PT Stop Time 1145    PT Time Calculation (min) 48 min    Activity Tolerance Patient tolerated treatment well    Behavior During Therapy WFL for tasks assessed/performed              Past Medical History:  Diagnosis Date   Duodenal adenoma    duodenal nodule   Hepatitis C, chronic (HCC)    treated prev with Harvoni, tested and cleared   History of kidney stones    passed stone   Hyperlipidemia    Hypertension    Wears glasses    Past Surgical History:  Procedure Laterality Date   APPENDECTOMY  1948   BIOPSY  10/30/2020   Procedure: BIOPSY;  Surgeon: Lemar Lofty., MD;  Location: [facility] ENDOSCOPY;  Service: Gastroenterology;;   ENDOSCOPIC MUCOSAL RESECTION N/A 09/10/2019   Procedure: ENDOSCOPIC MUCOSAL RESECTION;  Surgeon: Lemar Lofty., MD;  Location: [facility] ENDOSCOPY;  Service: Gastroenterology;  Laterality: N/A;   ENTEROSCOPY N/A 10/30/2020   Procedure: ENTEROSCOPY;  Surgeon: Meridee Score Netty Starring., MD;  Location: [facility] ENDOSCOPY;  Service: Gastroenterology;  Laterality: N/A;   ESOPHAGEAL DILATION  09/10/2019   Procedure: ESOPHAGEAL DILATION;  Surgeon: Meridee Score Netty Starring., MD;  Location: [facility] ENDOSCOPY;  Service: Gastroenterology;;   ESOPHAGOGASTRODUODENOSCOPY (EGD) WITH PROPOFOL N/A 06/04/2019   Procedure: ESOPHAGOGASTRODUODENOSCOPY (EGD) WITH PROPOFOL;  Surgeon: Pasty Spillers, MD;  Location: [facility] ENDOSCOPY;  Service: Endoscopy;  Laterality: N/A;   ESOPHAGOGASTRODUODENOSCOPY (EGD) WITH PROPOFOL N/A 09/10/2019   Procedure: ESOPHAGOGASTRODUODENOSCOPY (EGD) WITH PROPOFOL;  Surgeon: Meridee Score Netty Starring.,  MD;  Location: [facility] ENDOSCOPY;  Service: Gastroenterology;  Laterality: N/A;   HEMOSTASIS CLIP PLACEMENT  09/10/2019   Procedure: HEMOSTASIS CLIP PLACEMENT;  Surgeon: Lemar Lofty., MD;  Location: [facility] ENDOSCOPY;  Service: Gastroenterology;;   JOINT REPLACEMENT  10/18 and 8/19   LIVER BIOPSY     POLYPECTOMY  09/10/2019   Procedure: POLYPECTOMY;  Surgeon: Meridee Score Netty Starring., MD;  Location: [facility] ENDOSCOPY;  Service: Gastroenterology;;   POLYPECTOMY  10/30/2020   Procedure: POLYPECTOMY;  Surgeon: Lemar Lofty., MD;  Location: [facility] ENDOSCOPY;  Service: Gastroenterology;;   PROSTATE BIOPSY     neg, x2   SUBMUCOSAL LIFTING INJECTION  09/10/2019   Procedure: SUBMUCOSAL LIFTING INJECTION;  Surgeon: Lemar Lofty., MD;  Location: [facility] ENDOSCOPY;  Service: Gastroenterology;;   SUBMUCOSAL TATTOO INJECTION  09/10/2019   Procedure: SUBMUCOSAL TATTOO INJECTION;  Surgeon: Lemar Lofty., MD;  Location: [facility] ENDOSCOPY;  Service: Gastroenterology;;   teeth extractions     TOE FUSION  1985   left great toe   TONSILLECTOMY AND ADENOIDECTOMY  1945   TOTAL HIP ARTHROPLASTY Left 11/09/2016   Procedure: LEFT TOTAL HIP ARTHROPLASTY ANTERIOR APPROACH;  Surgeon: Kathryne Hitch, MD;  Location: [facility] OR;  Service: Orthopedics;  Laterality: Left;   TOTAL HIP ARTHROPLASTY Right 09/13/2017   Procedure: RIGHT TOTAL HIP ARTHROPLASTY ANTERIOR APPROACH;  Surgeon: Kathryne Hitch, MD;  Location: [facility] OR;  Service: Orthopedics;  Laterality: Right;   UPPER ESOPHAGEAL ENDOSCOPIC ULTRASOUND (EUS) N/A 09/10/2019   Procedure: UPPER ESOPHAGEAL  ENDOSCOPIC ULTRASOUND (EUS);  Surgeon: Lemar Lofty., MD;  Location: [facility] ENDOSCOPY;  Service: Gastroenterology;  Laterality: N/A;   VASECTOMY     WISDOM TOOTH EXTRACTION Right 02/26/2021   Patient Active Problem List   Diagnosis Date Noted   Gait abnormality 05/12/2022   Duodenal adenoma 07/15/2019   Duodenal nodule 07/15/2019    Compression, esophagus    Stomach irritation    Gastric polyp    Esophageal dysphagia 03/07/2019   Status post total replacement of right hip 09/13/2017   Essential hypertension 08/10/2017   Preoperative cardiovascular examination 08/10/2017   Unilateral primary osteoarthritis, right hip 07/13/2017   Status post total replacement of left hip 11/09/2016   Pain of left hip joint 10/18/2016   Unilateral primary osteoarthritis, left hip 10/18/2016   Lumbar spondylosis 07/19/2016   Healthcare maintenance 02/19/2016   Rash 02/19/2016   Hyperglycemia 02/19/2016   Advance care planning 01/06/2014   Osteoarthritis, hip, bilateral 10/31/2012   Medicare annual wellness visit, subsequent 10/24/2012   Left hip pain 10/04/2012   Elevated PSA 10/06/2011   Hyperlipidemia    Kidney stone    History of hepatitis C, previously treated with Harvoni    FH: CAD (coronary artery disease) 11/27/2009    PCP: Joaquim Nam, MD   REFERRING PROVIDER: Joaquim Nam, MD   REFERRING DIAG:  R26.9 (ICD-10-CM) - Gait abnormality  M25.569 (ICD-10-CM) - Knee pain, unspecified chronicity, unspecified laterality    THERAPY DIAG:  Gait abnormality  Knee pain, unspecified chronicity, unspecified laterality  Rationale for Evaluation and Treatment: Rehabilitation  ONSET DATE: 5 years   SUBJECTIVE:   SUBJECTIVE STATEMENT: Pt reports that he is doing well. Reports that foot drop and knee pain with stairs have subsided since starting therapy, but pain in the R hip/gluteal/SI joint have continued.   PERTINENT HISTORY: Bil hip replacement in 2018-2019  PAIN:  Are you having pain? No  PRECAUTIONS: None  WEIGHT BEARING RESTRICTIONS: No  FALLS:  Has patient fallen in last 6 months? No  LIVING ENVIRONMENT: Lives with: lives with their spouse Lives in: House/apartment Stairs: Yes: External: 1 steps; none Has following equipment at home: None  OCCUPATION: Research scientist (medical) for workers [employer]    PLOF: Independent, Independent with basic ADLs, and Independent with household mobility with device  PATIENT GOALS: figure out occasional pain in the R knee. Ara Kussmaul MD VISIT: April 2025  OBJECTIVE:   DIAGNOSTIC FINDINGS:   Xray  Right knee AP lateral views: Medial lateral joint lines well-preserved.   Patellofemoral joint with some mild arthritic changes.  Otherwise no bony  abnormalities no acute findings.   AP pelvis: Status post bilateral total hip arthroplasties.  Components are  well-seated.  No acute findings.  Both hips are well located.    PATIENT SURVEYS:  LEFS 77/80 FOTO 69   COGNITION: Overall cognitive status: Within functional limits for tasks assessed     SENSATION: WFL  EDEMA:    MUSCLE LENGTH: Hamstrings: Right 131 deg; Left 136 deg Thomas test: Right 136 deg; Left 121 deg  POSTURE: rounded shoulders  PALPATION: Mild   LOWER EXTREMITY ROM:  Active ROM Right eval Left eval  Hip flexion    Hip extension 10 5  Hip abduction    Hip adduction    Hip internal rotation    Hip external rotation    Knee flexion prone:83 Prone:93  Knee extension    Ankle dorsiflexion    Ankle plantarflexion    Ankle inversion  Ankle eversion     (Blank rows = not tested)  LOWER EXTREMITY MMT:  MMT Right eval Left eval  Hip flexion 4+ 4+  Hip extension 4 4+  Hip abduction 4+ 5  Hip adduction 5 5  Hip internal rotation 4+ 4+  Hip external rotation 4+ 4+  Knee flexion 5 5  Knee extension 5 5  Ankle dorsiflexion 5 4+  Ankle plantarflexion 5 5  Ankle inversion    Ankle eversion     (Blank rows = not tested)  LOWER EXTREMITY SPECIAL TESTS:  Knee special tests: Anterior drawer test: negative, Posterior drawer test: negative, Apley's compression test: negative, Pivot shift test: negative, Patellafemoral grind test: negative, and Step up/down test: positive   FUNCTIONAL TESTS:  6 minute walk test: 1622ft 10 meter walk test: 6.6 sec  SLS LLE  17sec RLE 29sec   GAIT: Distance walked: 1631ft Assistive device utilized: None Level of assistance: Complete Independence Comments: mild foot slap on the LLE decreased hip extension on BLE    TODAY'S TREATMENT:                                                                                                                              DATE:   Nustep level 5 x 5 min.   Supine:  Single knee to chest 30 sec x 2 bil  LTR 2 x 12 bil with 5 sec hold  Bridge with ball between thighs 2 x 12 Sidelying clam shell with GTB 2 x 12 bil  SLR x 10  Sitting Figure 4 stretch x 30 sec bil Sitting Piriformis stretch x 30 sec bil  Standing heel raise/toe raise. X 12 bil    Manual.  STM with trigger point release to Glute medius. X 4 min  TDN Treatment: (Unbilled)  Patient consent: After explanation of TDN Rationale, Procedures, outcomes, and potential side effects, patient verbalized consent to TDN treatment.  Region/Dx: Right posteriolateral Hip  Muscles Treated: general Gluteals just posterior/sup to GT- min, medius  (2 needle stick 0.3x60)  Post treatment pain/response: + report of relief per patient and no tenderness after Dry needling.  Post treatment Instructions: Patient instructed to expect mild to moderate muscle soreness this evening and tomorrow. Patient instructed to continued prescribed home exercise program.  Patient also educated on signs and symptoms of infection, however unlikely, and to seek immediate medical attention shoulder thy occur. Patient verbalized understanding of these instructions.   PATIENT EDUCATION:  Education details: POC orientation to facility  Person educated: Patient Education method: Explanation Education comprehension: verbalized understanding  HOME EXERCISE PROGRAM:  Access Code: [email or web address] URL: [email or web address] Date: 06/09/2022 Prepared by: Grier Rocher  Exercises - Supine Bridge  - 1 x daily - 5 x weekly - 2 sets - 10 reps - 3  hold - Supine Lower Trunk Rotation  - 1 x daily - 5 x weekly - 2 sets - 10 reps - 3 hold  SLS 5 x  15 sec  Ankle DF seated x 10  Piriformis stretch. 30 sec hold x 3 bil   ASSESSMENT:  CLINICAL IMPRESSION: Patient presented with excellent motivation for PT treatment on this day. Performed strengthening exercises to target focal weakness in the R hip. Therapeutic dry needling performed to R gluteal/piriformis to address pain, trigger point, and improve muscle extensibility. Pt reports mild reduction in pain upon completion.   Pt would benefit from PT for pain management and improved strengthening in BLE to improve QoL and allow return to PLOF.   OBJECTIVE IMPAIRMENTS: cardiopulmonary status limiting activity, decreased balance, difficulty walking, decreased ROM, decreased strength, increased fascial restrictions, impaired perceived functional ability, and impaired flexibility.   ACTIVITY LIMITATIONS: squatting, dressing, and locomotion level  PARTICIPATION LIMITATIONS: community activity and yard work  PERSONAL FACTORS: Age are also affecting patient's functional outcome.   REHAB POTENTIAL: Excellent  CLINICAL DECISION MAKING: Stable/uncomplicated  EVALUATION COMPLEXITY: Low   GOALS:  SHORT TERM GOALS: Target date: 07/01/2022    Patient will be independent in home exercise program to improve strength/mobility for better functional independence with ADLs. Baseline: to be given at second treatment Goal status: INITIAL   LONG TERM GOALS: Target date: 08/13/2022    Patient will increase FOTO score to equal to or greater than   74  to demonstrate statistically significant improvement in mobility and quality of life.  Baseline: 69 Goal status: INITIAL  2.  Patient will increase R hip and L ankle strength to match LLE to improve symmetry of movement improve function with dynamic activities such pickle ball and tennis  Baseline: 4/5 to 4+/5 Goal status: INITIAL  3.  Patient will  increase 6 min walk test by >228ft to demonstrate decreased fall risk and improved independence during functional activities Baseline: 1640ft Goal status: INITIAL  4.  Patient will increase SLS on BLE to >30 sec to demonstrate improve function with dynamic tasks including walking on trail and dressing  Baseline: L 17 sec and R 29 sec  Goal status: INITIAL    PLAN:  PT FREQUENCY: 1-2x/week  PT DURATION: 12 weeks  PLANNED INTERVENTIONS: Therapeutic exercises, Therapeutic activity, Neuromuscular re-education, Balance training, Gait training, Patient/Family education, Self Care, Joint mobilization, Joint manipulation, Stair training, Dry Needling, Spinal manipulation, Spinal mobilization, Taping, and Manual therapy  PLAN FOR NEXT SESSION:   BLE strengthening/stretching. L ankle DF strengthening. Initiate exercise program at [facility].    Grier Rocher PT, DPT  Physical Therapist - [facility]  1:02 PM 06/16/22

## 2022-06-21 ENCOUNTER — Ambulatory Visit: Payer: Medicare Other | Admitting: Physical Therapy

## 2022-06-21 ENCOUNTER — Encounter: Payer: Self-pay | Admitting: Gastroenterology

## 2022-06-22 ENCOUNTER — Encounter
Admission: RE | Disposition: A | Discharge status: Home / Self Care | Payer: Self-pay | Source: Home / Self Care | Attending: Gastroenterology

## 2022-06-22 ENCOUNTER — Ambulatory Visit: Payer: Medicare Other | Admitting: Certified Registered"

## 2022-06-22 ENCOUNTER — Ambulatory Visit
Admission: RE | Admit: 2022-06-22 | Discharge: 2022-06-22 | Disposition: A | Discharge status: Home / Self Care | Payer: Medicare Other | Attending: Gastroenterology | Admitting: Gastroenterology

## 2022-06-22 DIAGNOSIS — D124 Benign neoplasm of descending colon: Secondary | ICD-10-CM | POA: Diagnosis not present

## 2022-06-22 DIAGNOSIS — Z1211 Encounter for screening for malignant neoplasm of colon: Secondary | ICD-10-CM

## 2022-06-22 DIAGNOSIS — K219 Gastro-esophageal reflux disease without esophagitis: Secondary | ICD-10-CM | POA: Insufficient documentation

## 2022-06-22 DIAGNOSIS — Z87891 Personal history of nicotine dependence: Secondary | ICD-10-CM | POA: Diagnosis not present

## 2022-06-22 DIAGNOSIS — K644 Residual hemorrhoidal skin tags: Secondary | ICD-10-CM | POA: Insufficient documentation

## 2022-06-22 DIAGNOSIS — I10 Essential (primary) hypertension: Secondary | ICD-10-CM | POA: Insufficient documentation

## 2022-06-22 DIAGNOSIS — K649 Unspecified hemorrhoids: Secondary | ICD-10-CM | POA: Diagnosis not present

## 2022-06-22 DIAGNOSIS — K635 Polyp of colon: Secondary | ICD-10-CM

## 2022-06-22 DIAGNOSIS — D120 Benign neoplasm of cecum: Secondary | ICD-10-CM | POA: Insufficient documentation

## 2022-06-22 HISTORY — DX: Hypothyroidism, unspecified: E03.9

## 2022-06-22 HISTORY — PX: COLONOSCOPY WITH PROPOFOL: SHX5780

## 2022-06-22 SURGERY — COLONOSCOPY WITH PROPOFOL
Anesthesia: General

## 2022-06-22 MED ORDER — PROPOFOL 10 MG/ML IV BOLUS
INTRAVENOUS | Status: DC | PRN
  Administered 2022-06-22: 20 mg via INTRAVENOUS
  Administered 2022-06-22: 60 mg via INTRAVENOUS

## 2022-06-22 MED ORDER — PROPOFOL 500 MG/50ML IV EMUL
INTRAVENOUS | Status: DC | PRN
  Administered 2022-06-22: 165 ug/kg/min via INTRAVENOUS

## 2022-06-22 MED ORDER — LIDOCAINE HCL (CARDIAC) PF 100 MG/5ML IV SOSY
PREFILLED_SYRINGE | INTRAVENOUS | Status: DC | PRN
  Administered 2022-06-22: 100 mg via INTRAVENOUS

## 2022-06-22 MED ORDER — SODIUM CHLORIDE 0.9 % IV SOLN: INTRAVENOUS | Status: DC

## 2022-06-22 NOTE — Anesthesia Procedure Notes (Signed)
Procedure Name: General with mask airway Date/Time: 06/22/2022 8:38 AM  Performed by: Mohammed Kindle, CRNAPre-anesthesia Checklist: Patient identified, Emergency Drugs available, Suction available and Patient being monitored Patient Re-evaluated:Patient Re-evaluated prior to induction Oxygen Delivery Method: Simple face mask Induction Type: IV induction Placement Confirmation: positive ETCO2, CO2 detector and breath sounds checked- equal and bilateral Dental Injury: Teeth and Oropharynx as per pre-operative assessment

## 2022-06-22 NOTE — Anesthesia Postprocedure Evaluation (Signed)
Anesthesia Post Note  Patient: Victor Ball  Procedure(s) Performed: COLONOSCOPY WITH PROPOFOL  Patient location during evaluation: Endoscopy Anesthesia Type: General Level of consciousness: awake and alert Pain management: pain level controlled Vital Signs Assessment: post-procedure vital signs reviewed and stable Respiratory status: spontaneous breathing, nonlabored ventilation, respiratory function stable and patient connected to nasal cannula oxygen Cardiovascular status: blood pressure returned to baseline and stable Postop Assessment: no apparent nausea or vomiting Anesthetic complications: no   No notable events documented.   Last Vitals:  Vitals:   06/22/22 0858 06/22/22 0918  BP: 102/66 136/71  Pulse:    Resp:    Temp: (!) 36 C   SpO2:      Last Pain:  Vitals:   06/22/22 0918  TempSrc:   PainSc: 0-No pain                 Corinda Gubler

## 2022-06-22 NOTE — Anesthesia Preprocedure Evaluation (Signed)
Anesthesia Evaluation  Patient identified by MRN, date of birth, ID band Patient awake    Reviewed: Allergy & Precautions, NPO status , Patient's Chart, lab work & pertinent test results  History of Anesthesia Complications Negative for: history of anesthetic complications  Airway Mallampati: II  TM Distance: >3 FB Neck ROM: Full    Dental  (+) Chipped, Caps, Dental Advisory Given   Pulmonary neg pulmonary ROS, neg sleep apnea, neg COPD, Patient abstained from smoking.Not current smoker, former smoker   breath sounds clear to auscultation       Cardiovascular Exercise Tolerance: Good METShypertension, Pt. on medications (-) angina (-) CAD and (-) Past MI (-) dysrhythmias  Rhythm:Regular Rate:Normal  '19 ECHO: mild LVH. Systolic function was normal, EF 60-65%. Wall motion was normal; no regional wall motion abnormalities. grade 1 DD, no significant valvular abnormalities   Neuro/Psych negative neurological ROS  negative psych ROS   GI/Hepatic ,GERD  Controlled,,(+)     (-) substance abuse  Duodenal adenoma   Endo/Other  neg diabetes    Renal/GU negative Renal ROSH/o stones     Musculoskeletal  (+) Arthritis , Osteoarthritis,    Abdominal   Peds  Hematology negative hematology ROS (+)   Anesthesia Other Findings Past Medical History: No date: Duodenal adenoma     Comment:  duodenal nodule No date: Hepatitis C, chronic (HCC)     Comment:  treated prev with Harvoni, tested and cleared No date: History of kidney stones     Comment:  passed stone No date: Hyperlipidemia No date: Hypertension No date: Hypothyroidism No date: Wears glasses  Reproductive/Obstetrics                             Anesthesia Physical Anesthesia Plan  ASA: 2  Anesthesia Plan: General   Post-op Pain Management: Minimal or no pain anticipated   Induction: Intravenous  PONV Risk Score and Plan: 2 and  Propofol infusion, TIVA and Ondansetron  Airway Management Planned: Natural Airway and Simple Face Mask  Additional Equipment: None  Intra-op Plan:   Post-operative Plan:   Informed Consent: I have reviewed the patients History and Physical, chart, labs and discussed the procedure including the risks, benefits and alternatives for the proposed anesthesia with the patient or authorized representative who has indicated his/her understanding and acceptance.     Dental advisory given  Plan Discussed with: CRNA and Surgeon  Anesthesia Plan Comments: (Discussed risks of anesthesia with patient, including possibility of difficulty with spontaneous ventilation under anesthesia necessitating airway intervention, PONV, and rare risks such as cardiac or respiratory or neurological events, and allergic reactions. Discussed the role of CRNA in patient's perioperative care. Patient understands.)        Anesthesia Quick Evaluation

## 2022-06-22 NOTE — Transfer of Care (Signed)
Immediate Anesthesia Transfer of Care Note  Patient: Victor Ball  Procedure(s) Performed: COLONOSCOPY WITH PROPOFOL  Patient Location: Endoscopy Unit  Anesthesia Type:General  Level of Consciousness: drowsy and patient cooperative  Airway & Oxygen Therapy: Patient Spontanous Breathing and Patient connected to face mask oxygen  Post-op Assessment: Report given to RN and Post -op Vital signs reviewed and stable  Post vital signs: Reviewed and stable  Last Vitals:  Vitals Value Taken Time  BP 102/66 06/22/22 0858  Temp 36 C 06/22/22 0858  Pulse 32 06/22/22 0859  Resp 16 06/22/22 0859  SpO2 99 % 06/22/22 0859  Vitals shown include unvalidated device data.  Last Pain:  Vitals:   06/22/22 0858  TempSrc: Temporal  PainSc: Asleep         Complications: No notable events documented.

## 2022-06-22 NOTE — Op Note (Signed)
[facility] Gastroenterology Patient Name: Victor Ball Procedure Date: 06/22/2022 8:26 AM MRN: [id number] Account #: [id number] Date of Birth: 14-Apr-1940 Admit Type: Outpatient Age: 82 Room: [facility] ENDO ROOM 3 Gender: Male Note Status: Finalized Instrument Name: Nelda Marseille [id number] Procedure:             Colonoscopy Indications:           Screening for colorectal malignant neoplasm, Last                         colonoscopy 10 years ago Providers:             Toney Reil MD, MD Referring MD:          Dwana Curd. Para March, MD (Referring MD) Medicines:             General Anesthesia Complications:         No immediate complications. Estimated blood loss: None. Procedure:             Pre-Anesthesia Assessment:                        - Prior to the procedure, a History and Physical was                         performed, and patient medications and allergies were                         reviewed. The patient is competent. The risks and                         benefits of the procedure and the sedation options and                         risks were discussed with the patient. All questions                         were answered and informed consent was obtained.                         Patient identification and proposed procedure were                         verified by the physician, the nurse, the                         anesthesiologist, the anesthetist and the technician                         in the pre-procedure area in the procedure room in the                         endoscopy suite. Mental Status Examination: alert and                         oriented. Airway Examination: normal oropharyngeal                         airway and neck mobility. Respiratory Examination:  clear to auscultation. CV Examination: normal.                         Prophylactic Antibiotics: The patient does not require                         prophylactic antibiotics.  Prior Anticoagulants: The                         patient has taken no anticoagulant or antiplatelet                         agents. ASA Grade Assessment: II - A patient with mild                         systemic disease. After reviewing the risks and                         benefits, the patient was deemed in satisfactory                         condition to undergo the procedure. The anesthesia                         plan was to use general anesthesia. Immediately prior                         to administration of medications, the patient was                         re-assessed for adequacy to receive sedatives. The                         heart rate, respiratory rate, oxygen saturations,                         blood pressure, adequacy of pulmonary ventilation, and                         response to care were monitored throughout the                         procedure. The physical status of the patient was                         re-assessed after the procedure.                        After obtaining informed consent, the colonoscope was                         passed under direct vision. Throughout the procedure,                         the patient's blood pressure, pulse, and oxygen                         saturations were monitored continuously. The  Colonoscope was introduced through the anus and                         advanced to the the cecum, identified by appendiceal                         orifice and ileocecal valve. The colonoscopy was                         performed without difficulty. The patient tolerated                         the procedure well. The quality of the bowel                         preparation was evaluated using the BBPS [facility] Bowel                         Preparation Scale) with scores of: Right Colon = 3,                         Transverse Colon = 3 and Left Colon = 3 (entire mucosa                         seen well with no  residual staining, small fragments                         of stool or opaque liquid). The total BBPS score                         equals 9. The ileocecal valve, appendiceal orifice,                         and rectum were photographed. Findings:      The perianal and digital rectal examinations were normal. Pertinent       negatives include normal sphincter tone and no palpable rectal lesions.      Three sessile polyps were found in the sigmoid colon, descending colon       and cecum. The polyps were 4 to 5 mm in size. These polyps were removed       with a cold snare. Resection and retrieval were complete. Estimated       blood loss: none.      Non-bleeding external hemorrhoids were found during retroflexion. The       hemorrhoids were large. Impression:            - Three 4 to 5 mm polyps in the sigmoid colon, in the                         descending colon and in the cecum, removed with a cold                         snare. Resected and retrieved.                        - Non-bleeding external hemorrhoids. Recommendation:        - Discharge patient to home (with  spouse).                        - Resume previous diet today.                        - Continue present medications.                        - Await pathology results.                        - Repeat colonoscopy in 3 - 5 years for surveillance                         of multiple polyps. Procedure Code(s):     --- Professional ---                        [phone], Colonoscopy, flexible; with removal of                         tumor(s), polyp(s), or other lesion(s) by snare                         technique Diagnosis Code(s):     --- Professional ---                        Z12.11, Encounter for screening for malignant neoplasm                         of colon                        D12.5, Benign neoplasm of sigmoid colon                        D12.4, Benign neoplasm of descending colon                        D12.0, Benign  neoplasm of cecum                        K64.4, Residual hemorrhoidal skin tags CPT copyright 2022 American Medical Association. All rights reserved. The codes documented in this report are preliminary and upon coder review may  be revised to meet current compliance requirements. Dr. Libby Maw Toney Reil MD, MD 06/22/2022 8:57:36 AM This report has been signed electronically. Number of Addenda: 0 Note Initiated On: 06/22/2022 8:26 AM Scope Withdrawal Time: 0 hours 17 minutes 36 seconds  Total Procedure Duration: 0 hours 24 minutes 0 seconds  Estimated Blood Loss:  Estimated blood loss: none.      [facility]

## 2022-06-22 NOTE — H&P (Signed)
Arlyss Repress, MD [address]  [address]  [place], [place] [address]  Main: [phone]  Fax: [phone] Pager: [phone]  Primary Care Physician:  Joaquim Nam, MD Primary Gastroenterologist:  Dr. Arlyss Repress  Pre-Procedure History & Physical: HPI:  Victor Ball is a 82 y.o. male is here for an colonoscopy.   Past Medical History:  Diagnosis Date   Duodenal adenoma    duodenal nodule   Hepatitis C, chronic (HCC)    treated prev with Harvoni, tested and cleared   History of kidney stones    passed stone   Hyperlipidemia    Hypertension    Hypothyroidism    Wears glasses     Past Surgical History:  Procedure Laterality Date   APPENDECTOMY  1948   BIOPSY  10/30/2020   Procedure: BIOPSY;  Surgeon: Lemar Lofty., MD;  Location: [facility] ENDOSCOPY;  Service: Gastroenterology;;   ENDOSCOPIC MUCOSAL RESECTION N/A 09/10/2019   Procedure: ENDOSCOPIC MUCOSAL RESECTION;  Surgeon: Lemar Lofty., MD;  Location: [facility] ENDOSCOPY;  Service: Gastroenterology;  Laterality: N/A;   ENTEROSCOPY N/A 10/30/2020   Procedure: ENTEROSCOPY;  Surgeon: Meridee Score Netty Starring., MD;  Location: [facility] ENDOSCOPY;  Service: Gastroenterology;  Laterality: N/A;   ESOPHAGEAL DILATION  09/10/2019   Procedure: ESOPHAGEAL DILATION;  Surgeon: Meridee Score Netty Starring., MD;  Location: [facility] ENDOSCOPY;  Service: Gastroenterology;;   ESOPHAGOGASTRODUODENOSCOPY (EGD) WITH PROPOFOL N/A 06/04/2019   Procedure: ESOPHAGOGASTRODUODENOSCOPY (EGD) WITH PROPOFOL;  Surgeon: Pasty Spillers, MD;  Location: [facility] ENDOSCOPY;  Service: Endoscopy;  Laterality: N/A;   ESOPHAGOGASTRODUODENOSCOPY (EGD) WITH PROPOFOL N/A 09/10/2019   Procedure: ESOPHAGOGASTRODUODENOSCOPY (EGD) WITH PROPOFOL;  Surgeon: Meridee Score Netty Starring., MD;  Location: [facility] ENDOSCOPY;  Service: Gastroenterology;  Laterality: N/A;   HEMOSTASIS CLIP PLACEMENT  09/10/2019   Procedure: HEMOSTASIS CLIP PLACEMENT;  Surgeon: Lemar Lofty., MD;  Location: [facility] ENDOSCOPY;  Service: Gastroenterology;;   JOINT REPLACEMENT  10/18 and 8/19   LIVER BIOPSY     POLYPECTOMY  09/10/2019   Procedure: POLYPECTOMY;  Surgeon: Meridee Score Netty Starring., MD;  Location: [facility] ENDOSCOPY;  Service: Gastroenterology;;   POLYPECTOMY  10/30/2020   Procedure: POLYPECTOMY;  Surgeon: Lemar Lofty., MD;  Location: [facility] ENDOSCOPY;  Service: Gastroenterology;;   PROSTATE BIOPSY     neg, x2   SUBMUCOSAL LIFTING INJECTION  09/10/2019   Procedure: SUBMUCOSAL LIFTING INJECTION;  Surgeon: Lemar Lofty., MD;  Location: [facility] ENDOSCOPY;  Service: Gastroenterology;;   SUBMUCOSAL TATTOO INJECTION  09/10/2019   Procedure: SUBMUCOSAL TATTOO INJECTION;  Surgeon: Lemar Lofty., MD;  Location: [facility] ENDOSCOPY;  Service: Gastroenterology;;   teeth extractions     TOE FUSION  1985   left great toe   TONSILLECTOMY AND ADENOIDECTOMY  1945   TOTAL HIP ARTHROPLASTY Left 11/09/2016   Procedure: LEFT TOTAL HIP ARTHROPLASTY ANTERIOR APPROACH;  Surgeon: Kathryne Hitch, MD;  Location: [facility] OR;  Service: Orthopedics;  Laterality: Left;   TOTAL HIP ARTHROPLASTY Right 09/13/2017   Procedure: RIGHT TOTAL HIP ARTHROPLASTY ANTERIOR APPROACH;  Surgeon: Kathryne Hitch, MD;  Location: [facility] OR;  Service: Orthopedics;  Laterality: Right;   UPPER ESOPHAGEAL ENDOSCOPIC ULTRASOUND (EUS) N/A 09/10/2019   Procedure: UPPER ESOPHAGEAL ENDOSCOPIC ULTRASOUND (EUS);  Surgeon: Lemar Lofty., MD;  Location: [facility] ENDOSCOPY;  Service: Gastroenterology;  Laterality: N/A;   VASECTOMY     WISDOM TOOTH EXTRACTION Right 02/26/2021    Prior to Admission medications   Medication Sig Start Date End Date Taking? Authorizing Provider  atorvastatin (LIPITOR) 10 MG  tablet Take 1 tablet (10 mg total) by mouth daily. 05/10/22  Yes Joaquim Nam, MD  lisinopril (ZESTRIL) 5 MG tablet Take 1 tablet (5 mg total) by mouth daily. 05/10/22  Yes Joaquim Nam, MD  Omega-3  Fatty Acids (OMEGA 3 500 PO) Take by mouth.    [provider]    Allergies as of 05/25/2022   (No Known Allergies)    Family History  Problem Relation Age of Onset   Heart disease Father    Diabetes Father    Heart attack Father    Celiac disease Sister    Lung cancer Sister    Heart disease Brother        MI then stents   Heart attack Brother    Cancer Brother    Diabetes Brother    Stroke Brother    Diabetes Brother    Cancer Paternal Aunt    Cancer Sister    Early death Sister    Early death Brother    Stroke Brother    Prostate cancer Neg Hx    Colon cancer Neg Hx    Esophageal cancer Neg Hx    Inflammatory bowel disease Neg Hx    Liver disease Neg Hx    Pancreatic cancer Neg Hx    Rectal cancer Neg Hx    Stomach cancer Neg Hx     Social History   Socioeconomic History   Marital status: Married    Spouse name: Not on file   Number of children: 2   Years of education: Not on file   Highest education level: Not on file  Occupational History   Occupation: Insurance agent---retired    Comment: Psychologist, forensic and worker's comp  Tobacco Use   Smoking status: Former    Packs/day: 1.00    Years: 15.00    Additional pack years: 0.00    Total pack years: 15.00    Types: Cigarettes    Quit date: 01/18/1973    Years since quitting: 49.4   Smokeless tobacco: Never  Vaping Use   Vaping Use: Never used  Substance and Sexual Activity   Alcohol use: Yes    Alcohol/week: 8.0 standard drinks of alcohol    Types: 6 Glasses of wine, 2 Shots of liquor per week    Comment: 1 6 0z drink per day   Drug use: No   Sexual activity: Not Currently    Birth control/protection: Condom  Other Topics Concern   Not on file  Social History Narrative   2 daughters   Married 1961   Partially retired from Community education officer, still consulting some as of 2024      Wife had stomach cancer.        Has living will   Wife, then daughters equally if wife were incapacitated,  has health care POA.   Would accept resuscitation attempts   Not sure about tube feeds   Social Determinants of Health   Financial Resource Strain: Low Risk  (03/03/2022)   Overall Financial Resource Strain (CARDIA)    Difficulty of Paying Living Expenses: Not hard at all  Food Insecurity: No Food Insecurity (03/03/2022)   Hunger Vital Sign    Worried About Running Out of Food in the Last Year: Never true    Ran Out of Food in the Last Year: Never true  Transportation Needs: No Transportation Needs (03/03/2022)   PRAPARE - Administrator, Civil Service (Medical): No    Lack of Transportation (  Non-Medical): No  Physical Activity: Sufficiently Active (03/03/2022)   Exercise Vital Sign    Days of Exercise per Week: 5 days    Minutes of Exercise per Session: 120 min  Stress: No Stress Concern Present (03/03/2022)   [employer] of Occupational Health - Occupational Stress Questionnaire    Feeling of Stress : Not at all  Social Connections: Socially Integrated (03/03/2022)   Social Connection and Isolation Panel [NHANES]    Frequency of Communication with Friends and Family: More than three times a week    Frequency of Social Gatherings with Friends and Family: More than three times a week    Attends Religious Services: More than 4 times per year    Active Member of [employer] or Organizations: Yes    Attends Engineer, structural: More than 4 times per year    Marital Status: Married  Catering manager Violence: Not At Risk (03/03/2022)   Humiliation, Afraid, Rape, and Kick questionnaire    Fear of Current or Ex-Partner: No    Emotionally Abused: No    Physically Abused: No    Sexually Abused: No    Review of Systems: See HPI, otherwise negative ROS  Physical Exam: BP (!) 161/84   Pulse (!) 59   Temp (!) 96.4 F (35.8 C) (Temporal)   Resp 18   Ht 6' 2.5" (1.892 m)   Wt 89.8 kg   SpO2 99%   BMI 25.08 kg/m  General:   Alert,  pleasant and cooperative in  NAD Head:  Normocephalic and atraumatic. Neck:  Supple; no masses or thyromegaly. Lungs:  Clear throughout to auscultation.    Heart:  Regular rate and rhythm. Abdomen:  Soft, nontender and nondistended. Normal bowel sounds, without guarding, and without rebound.   Neurologic:  Alert and  oriented x4;  grossly normal neurologically.  Impression/Plan: Victor Ball is here for an colonoscopy to be performed for colon cancer screening  Risks, benefits, limitations, and alternatives regarding  colonoscopy have been reviewed with the patient.  Questions have been answered.  All parties agreeable.   Lannette Donath, MD  06/22/2022, 8:19 AM

## 2022-06-24 ENCOUNTER — Ambulatory Visit: Payer: Medicare Other | Attending: Family Medicine | Admitting: Physical Therapy

## 2022-06-24 DIAGNOSIS — R269 Unspecified abnormalities of gait and mobility: Secondary | ICD-10-CM | POA: Insufficient documentation

## 2022-06-24 DIAGNOSIS — M25569 Pain in unspecified knee: Secondary | ICD-10-CM | POA: Insufficient documentation

## 2022-06-24 DIAGNOSIS — M25551 Pain in right hip: Secondary | ICD-10-CM | POA: Insufficient documentation

## 2022-06-24 NOTE — Therapy (Signed)
OUTPATIENT PHYSICAL THERAPY LOWER Treatment    Patient Name: Victor Ball MRN: [id number] DOB:25-Nov-1940, 82 y.o., male Today's Date: 06/24/2022  END OF SESSION:  PT End of Session - 06/24/22 0852     Visit Number 6    Number of Visits 24    Date for PT Re-Evaluation 08/12/22    Progress Note Due on Visit 10    PT Start Time 0850    PT Stop Time 0931    PT Time Calculation (min) 41 min    Activity Tolerance Patient tolerated treatment well    Behavior During Therapy WFL for tasks assessed/performed              Past Medical History:  Diagnosis Date   Duodenal adenoma    duodenal nodule   Hepatitis C, chronic (HCC)    treated prev with Harvoni, tested and cleared   History of kidney stones    passed stone   Hyperlipidemia    Hypertension    Hypothyroidism    Wears glasses    Past Surgical History:  Procedure Laterality Date   APPENDECTOMY  1948   BIOPSY  10/30/2020   Procedure: BIOPSY;  Surgeon: Lemar Lofty., MD;  Location: [facility] ENDOSCOPY;  Service: Gastroenterology;;   ENDOSCOPIC MUCOSAL RESECTION N/A 09/10/2019   Procedure: ENDOSCOPIC MUCOSAL RESECTION;  Surgeon: Lemar Lofty., MD;  Location: [facility] ENDOSCOPY;  Service: Gastroenterology;  Laterality: N/A;   ENTEROSCOPY N/A 10/30/2020   Procedure: ENTEROSCOPY;  Surgeon: Meridee Score Netty Starring., MD;  Location: [facility] ENDOSCOPY;  Service: Gastroenterology;  Laterality: N/A;   ESOPHAGEAL DILATION  09/10/2019   Procedure: ESOPHAGEAL DILATION;  Surgeon: Meridee Score Netty Starring., MD;  Location: [facility] ENDOSCOPY;  Service: Gastroenterology;;   ESOPHAGOGASTRODUODENOSCOPY (EGD) WITH PROPOFOL N/A 06/04/2019   Procedure: ESOPHAGOGASTRODUODENOSCOPY (EGD) WITH PROPOFOL;  Surgeon: Pasty Spillers, MD;  Location: [facility] ENDOSCOPY;  Service: Endoscopy;  Laterality: N/A;   ESOPHAGOGASTRODUODENOSCOPY (EGD) WITH PROPOFOL N/A 09/10/2019   Procedure: ESOPHAGOGASTRODUODENOSCOPY (EGD) WITH PROPOFOL;  Surgeon: Meridee Score Netty Starring., MD;  Location: [facility] ENDOSCOPY;  Service: Gastroenterology;  Laterality: N/A;   HEMOSTASIS CLIP PLACEMENT  09/10/2019   Procedure: HEMOSTASIS CLIP PLACEMENT;  Surgeon: Lemar Lofty., MD;  Location: [facility] ENDOSCOPY;  Service: Gastroenterology;;   JOINT REPLACEMENT  10/18 and 8/19   LIVER BIOPSY     POLYPECTOMY  09/10/2019   Procedure: POLYPECTOMY;  Surgeon: Meridee Score Netty Starring., MD;  Location: [facility] ENDOSCOPY;  Service: Gastroenterology;;   POLYPECTOMY  10/30/2020   Procedure: POLYPECTOMY;  Surgeon: Lemar Lofty., MD;  Location: [facility] ENDOSCOPY;  Service: Gastroenterology;;   PROSTATE BIOPSY     neg, x2   SUBMUCOSAL LIFTING INJECTION  09/10/2019   Procedure: SUBMUCOSAL LIFTING INJECTION;  Surgeon: Lemar Lofty., MD;  Location: [facility] ENDOSCOPY;  Service: Gastroenterology;;   SUBMUCOSAL TATTOO INJECTION  09/10/2019   Procedure: SUBMUCOSAL TATTOO INJECTION;  Surgeon: Lemar Lofty., MD;  Location: [facility] ENDOSCOPY;  Service: Gastroenterology;;   teeth extractions     TOE FUSION  1985   left great toe   TONSILLECTOMY AND ADENOIDECTOMY  1945   TOTAL HIP ARTHROPLASTY Left 11/09/2016   Procedure: LEFT TOTAL HIP ARTHROPLASTY ANTERIOR APPROACH;  Surgeon: Kathryne Hitch, MD;  Location: [facility] OR;  Service: Orthopedics;  Laterality: Left;   TOTAL HIP ARTHROPLASTY Right 09/13/2017   Procedure: RIGHT TOTAL HIP ARTHROPLASTY ANTERIOR APPROACH;  Surgeon: Kathryne Hitch, MD;  Location: [facility] OR;  Service: Orthopedics;  Laterality: Right;   UPPER ESOPHAGEAL ENDOSCOPIC ULTRASOUND (EUS) N/A 09/10/2019  Procedure: UPPER ESOPHAGEAL ENDOSCOPIC ULTRASOUND (EUS);  Surgeon: Lemar Lofty., MD;  Location: [facility] ENDOSCOPY;  Service: Gastroenterology;  Laterality: N/A;   VASECTOMY     WISDOM TOOTH EXTRACTION Right 02/26/2021   Patient Active Problem List   Diagnosis Date Noted   Polyp of colon 06/22/2022   Encounter for screening colonoscopy 06/22/2022   Gait  abnormality 05/12/2022   Duodenal adenoma 07/15/2019   Duodenal nodule 07/15/2019   Compression, esophagus    Stomach irritation    Gastric polyp    Esophageal dysphagia 03/07/2019   Status post total replacement of right hip 09/13/2017   Essential hypertension 08/10/2017   Preoperative cardiovascular examination 08/10/2017   Unilateral primary osteoarthritis, right hip 07/13/2017   Status post total replacement of left hip 11/09/2016   Pain of left hip joint 10/18/2016   Unilateral primary osteoarthritis, left hip 10/18/2016   Lumbar spondylosis 07/19/2016   Healthcare maintenance 02/19/2016   Rash 02/19/2016   Hyperglycemia 02/19/2016   Advance care planning 01/06/2014   Osteoarthritis, hip, bilateral 10/31/2012   Medicare annual wellness visit, subsequent 10/24/2012   Left hip pain 10/04/2012   Elevated PSA 10/06/2011   Hyperlipidemia    Kidney stone    History of hepatitis C, previously treated with Harvoni    FH: CAD (coronary artery disease) 11/27/2009    PCP: Joaquim Nam, MD   REFERRING PROVIDER: Joaquim Nam, MD   REFERRING DIAG:  R26.9 (ICD-10-CM) - Gait abnormality  M25.569 (ICD-10-CM) - Knee pain, unspecified chronicity, unspecified laterality    THERAPY DIAG:  Gait abnormality  Knee pain, unspecified chronicity, unspecified laterality  Rationale for Evaluation and Treatment: Rehabilitation  ONSET DATE: 5 years   SUBJECTIVE:   SUBJECTIVE STATEMENT:  Pt reports that he is doing well. No pain in the R knee, no foot drop noted, also reports that he has significantly decreased pain in the R hip following therapeutic dry needling last PT session.  requesting education for dynamic warm up and cool down following physical activity. Pt reports performing "warm up" at [facility] on seated elliptical x 6 min  PERTINENT HISTORY: Bil hip replacement in 2018-2019  PAIN:  Are you having pain? No  PRECAUTIONS: None  WEIGHT BEARING RESTRICTIONS:  No  FALLS:  Has patient fallen in last 6 months? 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 Ara Kussmaul MD VISIT: April 2025  OBJECTIVE:   DIAGNOSTIC FINDINGS:   Xray  Right knee AP lateral views: Medial lateral joint lines well-preserved.   Patellofemoral joint with some mild arthritic changes.  Otherwise no bony  abnormalities no acute findings.   AP pelvis: Status post bilateral total hip arthroplasties.  Components are  well-seated.  No acute findings.  Both hips are well located.    PATIENT SURVEYS:  LEFS 77/80 FOTO 69   COGNITION: Overall cognitive status: Within functional limits for tasks assessed     SENSATION: WFL  EDEMA:    MUSCLE LENGTH: Hamstrings: Right 131 deg; Left 136 deg Thomas test: Right 136 deg; Left 121 deg  POSTURE: rounded shoulders  PALPATION: Mild   LOWER EXTREMITY ROM:  Active ROM Right eval Left eval  Hip flexion    Hip extension 10  5  Hip abduction    Hip adduction    Hip internal rotation    Hip external rotation    Knee flexion prone:83 Prone:93  Knee extension    Ankle dorsiflexion    Ankle plantarflexion    Ankle inversion    Ankle eversion     (Blank rows = not tested)  LOWER EXTREMITY MMT:  MMT Right eval Left eval  Hip flexion 4+ 4+  Hip extension 4 4+  Hip abduction 4+ 5  Hip adduction 5 5  Hip internal rotation 4+ 4+  Hip external rotation 4+ 4+  Knee flexion 5 5  Knee extension 5 5  Ankle dorsiflexion 5 4+  Ankle plantarflexion 5 5  Ankle inversion    Ankle eversion     (Blank rows = not tested)  LOWER EXTREMITY SPECIAL TESTS:  Knee special tests: Anterior drawer test:  negative, Posterior drawer test: negative, Apley's compression test: negative, Pivot shift test: negative, Patellafemoral grind test: negative, and Step up/down test: positive   FUNCTIONAL TESTS:  6 minute walk test: 1654ft 10 meter walk test: 6.6 sec  SLS LLE 17sec RLE 29sec   GAIT: Distance walked: 1616ft Assistive device utilized: None Level of assistance: Complete Independence Comments: mild foot slap on the LLE decreased hip extension on BLE    TODAY'S TREATMENT:                                                                                                                              DATE:    PT instructed pt in dynamic Warm up for athletic activities.   Performed walking 2x 5 Bil  HS stretch x 5 sec  Hip adductor stretch x 5 sec hold Hip flexor stretch  x 5 sec hold Gastroc stretch x 5 sec hold  Standing cool down stretch at rail support  HS stretch 2x 20 sec hold Quad stretch 2x 20 sec sec hold IT band stretch 2 x 20 sec hold Calf stretch  2 x 20 sec hold Hip flexor stretch 2  x20 sec hold   Education provided on dynamic warm up and static cooldown to reduce risk of injury and improve muscle extensibility with sudden quick dynamic movements.  Education also provided for use of upper extremity support to improve safety throughout dynamic warm up and to static cool downs.  PT monitor patient throughout session to ensure safe technique to minimize and decrease pain and improve safety through novel movements.   PATIENT EDUCATION:  Education details: POC orientation to facility. Pt educated throughout session about proper posture and technique with exercises. Improved exercise technique, movement at target joints, use of target muscles after min to mod verbal, visual, tactile cues.   Person educated: Patient Education method: Explanation Education comprehension: verbalized understanding  HOME EXERCISE PROGRAM:  Access Code: [email or web address] URL:  [email or web address] Date: 06/24/2022 Prepared by: Grier Rocher  Exercises - Standing Gastroc Stretch  - 1 x daily - 2 x  weekly - 1 sets - 5 reps - 5 hold - Standing Hamstring Stretch on Counter  - 1 x daily - 2 x weekly - 1 sets - 5 reps - 5 hold - ITB Stretch at Wall  - 1 x daily - 2 x weekly - 1 sets - 5 reps - 5 hold - Standing Quadriceps Stretch  - 1 x daily - 2 x weekly - 1 sets - 5 reps - 5 hold - Standing Hip Flexor Stretch  - 1 x daily - 2 x weekly - 1 sets - 5 reps  Access Code: [email or web address] URL: [email or web address] Date: 06/09/2022 Prepared by: Grier Rocher  Exercises - Supine Bridge  - 1 x daily - 5 x weekly - 2 sets - 10 reps - 3 hold - Supine Lower Trunk Rotation  - 1 x daily - 5 x weekly - 2 sets - 10 reps - 3 hold  SLS 5 x 15 sec  Ankle DF seated x 10  Piriformis stretch. 30 sec hold x 3 bil   ASSESSMENT:  CLINICAL IMPRESSION: Patient presented with excellent motivation for PT treatment on this day.  Patient states he feels like he has improved significantly since starting physical therapy treatment, may only need 1 or 2 more sessions to maximize rehab potential.  Requesting education on dynamic warm up and cool down for athletic activities.  Session focused on education for safe warm-ups and adequate cooldown following athletic activities.  Education provided throughout session along with improved technique with use of upper extremity support as appropriate to reduce fall risk reviewed and minimize injury.  Pt would benefit from PT for pain management and improved strengthening in BLE to improve QoL and allow return to PLOF.   OBJECTIVE IMPAIRMENTS: cardiopulmonary status limiting activity, decreased balance, difficulty walking, decreased ROM, decreased strength, increased fascial restrictions, impaired perceived functional ability, and impaired flexibility.   ACTIVITY LIMITATIONS: squatting, dressing, and locomotion level  PARTICIPATION  LIMITATIONS: community activity and yard work  PERSONAL FACTORS: Age are also affecting patient's functional outcome.   REHAB POTENTIAL: Excellent  CLINICAL DECISION MAKING: Stable/uncomplicated  EVALUATION COMPLEXITY: Low   GOALS:  SHORT TERM GOALS: Target date: 07/01/2022    Patient will be independent in home exercise program to improve strength/mobility for better functional independence with ADLs. Baseline: to be given at second treatment Goal status: INITIAL   LONG TERM GOALS: Target date: 08/13/2022    Patient will increase FOTO score to equal to or greater than   74  to demonstrate statistically significant improvement in mobility and quality of life.  Baseline: 69 Goal status: INITIAL  2.  Patient will increase R hip and L ankle strength to match LLE to improve symmetry of movement improve function with dynamic activities such pickle ball and tennis  Baseline: 4/5 to 4+/5 Goal status: INITIAL  3.  Patient will increase 6 min walk test by >280ft to demonstrate decreased fall risk and improved independence during functional activities Baseline: 1653ft Goal status: INITIAL  4.  Patient will increase SLS on BLE to >30 sec to demonstrate improve function with dynamic tasks including walking on trail and dressing  Baseline: L 17 sec and R 29 sec  Goal status: INITIAL    PLAN:  PT FREQUENCY: 1-2x/week  PT DURATION: 12 weeks  PLANNED INTERVENTIONS: Therapeutic exercises, Therapeutic activity, Neuromuscular re-education, Balance training, Gait training, Patient/Family education, Self Care, Joint mobilization, Joint manipulation, Stair training, Dry Needling, Spinal manipulation, Spinal mobilization, Taping, and Manual therapy  PLAN FOR  NEXT SESSION:   BLE strengthening/stretching. L ankle DF strengthening. Possible DC from PT services  Initiate exercise program at [facility].    Grier Rocher PT, DPT  Physical Therapist - [place]  [facility]  9:53 AM 06/24/22

## 2022-06-28 ENCOUNTER — Ambulatory Visit: Payer: Medicare Other | Admitting: Physical Therapy

## 2022-06-28 ENCOUNTER — Encounter: Payer: Self-pay | Admitting: Gastroenterology

## 2022-06-30 ENCOUNTER — Ambulatory Visit: Payer: Medicare Other | Admitting: Physical Therapy

## 2022-06-30 DIAGNOSIS — M25569 Pain in unspecified knee: Secondary | ICD-10-CM | POA: Diagnosis not present

## 2022-06-30 DIAGNOSIS — R269 Unspecified abnormalities of gait and mobility: Secondary | ICD-10-CM | POA: Diagnosis not present

## 2022-06-30 DIAGNOSIS — M25551 Pain in right hip: Secondary | ICD-10-CM | POA: Diagnosis not present

## 2022-06-30 NOTE — Therapy (Signed)
OUTPATIENT PHYSICAL THERAPY LOWER Treatment/ D/C summary   Patient Name: Victor Ball MRN: [id number] DOB:[birth year], 82 y.o., male Today's Date: 06/30/2022  END OF SESSION:  PT End of Session - 06/30/22 0935     Visit Number 7    Number of Visits 24    Date for PT Re-Evaluation 08/12/22    Progress Note Due on Visit 10    PT Start Time 0932    PT Stop Time 1014    PT Time Calculation (min) 42 min    Activity Tolerance Patient tolerated treatment well    Behavior During Therapy WFL for tasks assessed/performed              Past Medical History:  Diagnosis Date   Duodenal adenoma    duodenal nodule   Hepatitis C, chronic (HCC)    treated prev with Harvoni, tested and cleared   History of kidney stones    passed stone   Hyperlipidemia    Hypertension    Hypothyroidism    Wears glasses    Past Surgical History:  Procedure Laterality Date   APPENDECTOMY  1948   BIOPSY  10/30/2020   Procedure: BIOPSY;  Surgeon: Lemar Lofty., MD;  Location: [facility] ENDOSCOPY;  Service: Gastroenterology;;   COLONOSCOPY WITH PROPOFOL N/A 06/22/2022   Procedure: COLONOSCOPY WITH PROPOFOL;  Surgeon: Toney Reil, MD;  Location: [facility] ENDOSCOPY;  Service: Gastroenterology;  Laterality: N/A;   ENDOSCOPIC MUCOSAL RESECTION N/A 09/10/2019   Procedure: ENDOSCOPIC MUCOSAL RESECTION;  Surgeon: Meridee Score Netty Starring., MD;  Location: [facility] ENDOSCOPY;  Service: Gastroenterology;  Laterality: N/A;   ENTEROSCOPY N/A 10/30/2020   Procedure: ENTEROSCOPY;  Surgeon: Meridee Score Netty Starring., MD;  Location: [facility] ENDOSCOPY;  Service: Gastroenterology;  Laterality: N/A;   ESOPHAGEAL DILATION  09/10/2019   Procedure: ESOPHAGEAL DILATION;  Surgeon: Meridee Score Netty Starring., MD;  Location: [facility] ENDOSCOPY;  Service: Gastroenterology;;   ESOPHAGOGASTRODUODENOSCOPY (EGD) WITH PROPOFOL N/A 06/04/2019   Procedure: ESOPHAGOGASTRODUODENOSCOPY (EGD) WITH PROPOFOL;  Surgeon: Pasty Spillers, MD;  Location: [facility]  ENDOSCOPY;  Service: Endoscopy;  Laterality: N/A;   ESOPHAGOGASTRODUODENOSCOPY (EGD) WITH PROPOFOL N/A 09/10/2019   Procedure: ESOPHAGOGASTRODUODENOSCOPY (EGD) WITH PROPOFOL;  Surgeon: Meridee Score Netty Starring., MD;  Location: [facility] ENDOSCOPY;  Service: Gastroenterology;  Laterality: N/A;   HEMOSTASIS CLIP PLACEMENT  09/10/2019   Procedure: HEMOSTASIS CLIP PLACEMENT;  Surgeon: Lemar Lofty., MD;  Location: [facility] ENDOSCOPY;  Service: Gastroenterology;;   JOINT REPLACEMENT  10/18 and 8/19   LIVER BIOPSY     POLYPECTOMY  09/10/2019   Procedure: POLYPECTOMY;  Surgeon: Meridee Score Netty Starring., MD;  Location: [facility] ENDOSCOPY;  Service: Gastroenterology;;   POLYPECTOMY  10/30/2020   Procedure: POLYPECTOMY;  Surgeon: Lemar Lofty., MD;  Location: [facility] ENDOSCOPY;  Service: Gastroenterology;;   PROSTATE BIOPSY     neg, x2   SUBMUCOSAL LIFTING INJECTION  09/10/2019   Procedure: SUBMUCOSAL LIFTING INJECTION;  Surgeon: Lemar Lofty., MD;  Location: [facility] ENDOSCOPY;  Service: Gastroenterology;;   SUBMUCOSAL TATTOO INJECTION  09/10/2019   Procedure: SUBMUCOSAL TATTOO INJECTION;  Surgeon: Lemar Lofty., MD;  Location: [facility] ENDOSCOPY;  Service: Gastroenterology;;   teeth extractions     TOE FUSION  1985   left great toe   TONSILLECTOMY AND ADENOIDECTOMY  1945   TOTAL HIP ARTHROPLASTY Left 11/09/2016   Procedure: LEFT TOTAL HIP ARTHROPLASTY ANTERIOR APPROACH;  Surgeon: Kathryne Hitch, MD;  Location: [facility] OR;  Service: Orthopedics;  Laterality: Left;   TOTAL HIP ARTHROPLASTY Right 09/13/2017   Procedure: RIGHT TOTAL  HIP ARTHROPLASTY ANTERIOR APPROACH;  Surgeon: Kathryne Hitch, MD;  Location: [facility] OR;  Service: Orthopedics;  Laterality: Right;   UPPER ESOPHAGEAL ENDOSCOPIC ULTRASOUND (EUS) N/A 09/10/2019   Procedure: UPPER ESOPHAGEAL ENDOSCOPIC ULTRASOUND (EUS);  Surgeon: Lemar Lofty., MD;  Location: [facility] ENDOSCOPY;  Service: Gastroenterology;  Laterality: N/A;    VASECTOMY     WISDOM TOOTH EXTRACTION Right 02/26/2021   Patient Active Problem List   Diagnosis Date Noted   Polyp of colon 06/22/2022   Encounter for screening colonoscopy 06/22/2022   Gait abnormality 05/12/2022   Duodenal adenoma 07/15/2019   Duodenal nodule 07/15/2019   Compression, esophagus    Stomach irritation    Gastric polyp    Esophageal dysphagia 03/07/2019   Status post total replacement of right hip 09/13/2017   Essential hypertension 08/10/2017   Preoperative cardiovascular examination 08/10/2017   Unilateral primary osteoarthritis, right hip 07/13/2017   Status post total replacement of left hip 11/09/2016   Pain of left hip joint 10/18/2016   Unilateral primary osteoarthritis, left hip 10/18/2016   Lumbar spondylosis 07/19/2016   Healthcare maintenance 02/19/2016   Rash 02/19/2016   Hyperglycemia 02/19/2016   Advance care planning 01/06/2014   Osteoarthritis, hip, bilateral 10/31/2012   Medicare annual wellness visit, subsequent 10/24/2012   Left hip pain 10/04/2012   Elevated PSA 10/06/2011   Hyperlipidemia    Kidney stone    History of hepatitis C, previously treated with Harvoni    FH: CAD (coronary artery disease) 11/27/2009    PCP: Joaquim Nam, MD   REFERRING PROVIDER: Joaquim Nam, MD   REFERRING DIAG:  R26.9 (ICD-10-CM) - Gait abnormality  M25.569 (ICD-10-CM) - Knee pain, unspecified chronicity, unspecified laterality    THERAPY DIAG:  Gait abnormality  Knee pain, unspecified chronicity, unspecified laterality  Rationale for Evaluation and Treatment: Rehabilitation  ONSET DATE: 5 years   SUBJECTIVE:   SUBJECTIVE STATEMENT:  Pt reports that he is doing well. Mild pain in the L glute. Pt states that his knee and L foot are no longer in pain and have no more foot slap. Was able go camping over the weekend, slept on inflatable mattress on the ground.   Patient reports significant reduction in pain and increased strength in  the left lower extremity as well as improved functional mobility with descent of stairs.  Feels he is ready for discharge from PT services at this time.     PERTINENT HISTORY: Bil hip replacement in 2018-2019  PAIN:  Are you having pain? No  PRECAUTIONS: None  WEIGHT BEARING RESTRICTIONS: No  FALLS:  Has patient fallen in last 6 months? 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 Ara Kussmaul MD VISIT: April 2025  OBJECTIVE:   DIAGNOSTIC FINDINGS:   Xray  Right knee AP lateral views: Medial lateral joint lines well-preserved.   Patellofemoral joint with some mild arthritic changes.  Otherwise no bony  abnormalities no acute findings.   AP pelvis: Status post bilateral total hip arthroplasties.  Components are  well-seated.  No acute findings.  Both hips are well located.    PATIENT SURVEYS:  LEFS 77/80 FOTO 69   COGNITION: Overall cognitive status: Within functional limits for tasks assessed  SENSATION: WFL  EDEMA:    MUSCLE LENGTH: Hamstrings: Right 131 deg; Left 136 deg Thomas test: Right 136 deg; Left 121 deg  POSTURE: rounded shoulders  PALPATION: Mild tenderness in the Glute med. No pain in the R knee or LLE.   LOWER EXTREMITY ROM:  Active ROM Right eval Left eval    Hip flexion      Hip extension 10 5    Hip abduction      Hip adduction      Hip internal rotation      Hip external rotation      Knee flexion prone:83 Prone:93    Knee extension      Ankle dorsiflexion      Ankle plantarflexion      Ankle inversion      Ankle eversion       (Blank rows = not tested)  LOWER EXTREMITY MMT:  MMT Right eval  Left eval R 6/12 L 6/12  Hip flexion 4+ 4+ 5 4+  Hip extension 4 4+ 5 5  Hip abduction 4+ 5 5 5   Hip adduction 5 5 5 5   Hip internal rotation 4+ 4+ 5 5  Hip external rotation 4+ 4+ 5 5  Knee flexion 5 5 5 5   Knee extension 5 5 5 5   Ankle dorsiflexion 5 4+ 5 5  Ankle plantarflexion 5 5 5 5   Ankle inversion      Ankle eversion       (Blank rows = not tested)  LOWER EXTREMITY SPECIAL TESTS:  Knee special tests: Anterior drawer test: negative, Posterior drawer test: negative, Apley's compression test: negative, Pivot shift test: negative, Patellafemoral grind test: negative, and Step up/down test: positive   FUNCTIONAL TESTS:  6 minute walk test: 1665ft 10 meter walk test: 6.6 sec  SLS LLE 17sec RLE 29sec   GAIT: Distance walked: 164ft Assistive device utilized: None Level of assistance: Complete Independence Comments: mild foot slap on the LLE decreased hip extension on BLE    TODAY'S TREATMENT:                                                                                                                              DATE:   6 Min Walk Test:  Instructed patient to ambulate as quickly and as safely as possible for 6 minutes using LRAD. Patient was allowed to take standing rest breaks without stopping the test, but if the patient required a sitting rest break the clock would be stopped and the test would be over.  Results: 1784 feet using no AD without assist from PT. Results indicate that the patient has reduced endurance with ambulation compared to age matched norms.  Age Matched Norms: 39-69 yo M: 52 F: 6, 61-79 yo M: 52 F: 471, 24-89 yo M: 417 F: 392 MDC: 58.21 meters (190.98 feet) or 50 meters (ANPTA Core Set of Outcome Measures for Adults with Neurologic Conditions, 2018) Only mild foot slap  on the L with fatigue.   Re-assess HEP.  Bridge with 5-second hold x 5 Single knee-to-chest x 10-second hold LTR x 5-second hold bilateral Seated plantarflexion dorsiflexion  x 5 bilateral Standing plantarflexion dorsiflexion was 5 bilateral with upper extremity support on the radial. Piriformis stretch 10-second hold bilateral Figure 4 stretch 10-second hold Hamstring stretch x 10 seconds bilateral Education provided for proper stretching time and importance of keeping motion in pain-free range to reduce risk of injury   SLS 2 x 30 seconds bilateral  Goal assessment.  As listed below.  PATIENT EDUCATION:  Education details: POC orientation to facility. Pt educated throughout session about proper posture and technique with exercises. Improved exercise technique, movement at target joints, use of target muscles after min to mod verbal, visual, tactile cues.   Person educated: Patient Education method: Explanation Education comprehension: verbalized understanding  HOME EXERCISE PROGRAM:  Access Code: [email or web address] URL: [email or web address] Date: 06/24/2022 Prepared by: Grier Rocher  Exercises - Standing Gastroc Stretch  - 1 x daily - 2 x weekly - 1 sets - 5 reps - 5 hold - Standing Hamstring Stretch on Counter  - 1 x daily - 2 x weekly - 1 sets - 5 reps - 5 hold - ITB Stretch at Wall  - 1 x daily - 2 x weekly - 1 sets - 5 reps - 5 hold - Standing Quadriceps Stretch  - 1 x daily - 2 x weekly - 1 sets - 5 reps - 5 hold - Standing Hip Flexor Stretch  - 1 x daily - 2 x weekly - 1 sets - 5 reps  Access Code: [email or web address] URL: [email or web address] Date: 06/09/2022 Prepared by: Grier Rocher  Exercises - Supine Bridge  - 1 x daily - 5 x weekly - 2 sets - 10 reps - 3 hold - Supine Lower Trunk Rotation  - 1 x daily - 5 x weekly - 2 sets - 10 reps - 3 hold  SLS 5 x 15 sec  Ankle DF seated x 10  Piriformis stretch. 30 sec hold x 3 bil   ASSESSMENT:  CLINICAL IMPRESSION: Patient presented with excellent motivation for PT treatment on this day.  PT treatment focused on goal assessment and education for proper home exercise program  technique.  Patient demonstrates improved strength in bilateral lower extremity increased distance on 6-minute walk test, decreased pain in the right knee, and reduced foot slap on the left lower extremity.  Patient states he is ready to discharge from PT services.  Patient does note intermittent pain in the right hip but believes will be manageable from a massage therapist.  PT educated patient on benefits of skilled therapeutic interventions if pain persists in the right hip and encouraged patient to return if PT services are needed.  Patient no longer requires skilled PT at this time.    OBJECTIVE IMPAIRMENTS at eval : cardiopulmonary status limiting activity, decreased balance, difficulty walking, decreased ROM, decreased strength, increased fascial restrictions, impaired perceived functional ability, and impaired flexibility.   ACTIVITY LIMITATIONS: squatting, dressing, and locomotion level  PARTICIPATION LIMITATIONS: community activity and yard work  PERSONAL FACTORS: Age are also affecting patient's functional outcome.   REHAB POTENTIAL: Excellent  CLINICAL DECISION MAKING: Stable/uncomplicated  EVALUATION COMPLEXITY: Low   GOALS:  SHORT TERM GOALS: Target date: 07/01/2022    Patient will be independent in home exercise program to improve strength/mobility for better functional independence with ADLs. Baseline: to be given at second treatment Goal status: INITIAL   LONG  TERM GOALS: Target date: 08/13/2022    Patient will increase FOTO score to equal to or greater than   74  to demonstrate statistically significant improvement in mobility and quality of life.  Baseline: 69 6/12:  Goal status: INITIAL  2.  Patient will increase R hip and L ankle strength to match LLE to improve symmetry of movement improve function with dynamic activities such pickle ball and tennis  Baseline: 4/5 to 4+/5 6/12: grossly 5/5 except hip flexion in the L hip flexor   Goal status: MET  3.   Patient will increase 6 min walk test by >274ft to demonstrate decreased fall risk and improved independence during functional activities Baseline: 1669ft; 6/12: 1778ft  Goal status: IN PROGRESS  4.  Patient will increase SLS on BLE to >30 sec to demonstrate improve function with dynamic tasks including walking on trail and dressing  Baseline: L 17 sec and R 29 sec  6/12:  L 30 sec, R 35 sec  Goal status: MET    PLAN:  PT FREQUENCY: 1-2x/week  PT DURATION: 12 weeks  PLANNED INTERVENTIONS: Therapeutic exercises, Therapeutic activity, Neuromuscular re-education, Balance training, Gait training, Patient/Family education, Self Care, Joint mobilization, Joint manipulation, Stair training, Dry Needling, Spinal manipulation, Spinal mobilization, Taping, and Manual therapy  PLAN FOR NEXT SESSION:   D/c patient    Grier Rocher PT, DPT  Physical Therapist - [facility]  [facility]  1:46 PM 06/30/22

## 2022-07-05 ENCOUNTER — Ambulatory Visit: Payer: Medicare Other | Admitting: Physical Therapy

## 2022-07-07 ENCOUNTER — Ambulatory Visit: Payer: Medicare Other | Admitting: Physical Therapy

## 2022-07-12 ENCOUNTER — Ambulatory Visit: Payer: Medicare Other | Admitting: Physical Therapy

## 2022-07-14 ENCOUNTER — Ambulatory Visit: Payer: Medicare Other | Admitting: Physical Therapy

## 2022-07-19 ENCOUNTER — Ambulatory Visit: Payer: Medicare Other | Admitting: Physical Therapy

## 2022-07-21 ENCOUNTER — Ambulatory Visit: Payer: Medicare Other | Admitting: Physical Therapy

## 2022-07-23 ENCOUNTER — Other Ambulatory Visit: Payer: Medicare Other

## 2022-07-23 DIAGNOSIS — N400 Enlarged prostate without lower urinary tract symptoms: Secondary | ICD-10-CM

## 2022-07-23 DIAGNOSIS — R972 Elevated prostate specific antigen [PSA]: Secondary | ICD-10-CM

## 2022-07-24 LAB — PSA: Prostate Specific Ag, Serum: 11.1 ng/mL — ABNORMAL HIGH (ref 0.0–4.0)

## 2022-07-26 ENCOUNTER — Ambulatory Visit: Payer: Medicare Other | Admitting: Physical Therapy

## 2022-07-28 ENCOUNTER — Ambulatory Visit: Payer: Medicare Other | Admitting: Physical Therapy

## 2022-08-02 ENCOUNTER — Ambulatory Visit: Payer: Medicare Other | Admitting: Physical Therapy

## 2022-08-04 ENCOUNTER — Ambulatory Visit: Payer: Medicare Other | Admitting: Physical Therapy

## 2022-08-09 ENCOUNTER — Ambulatory Visit: Payer: Medicare Other | Admitting: Physical Therapy

## 2022-08-11 ENCOUNTER — Ambulatory Visit: Payer: Medicare Other | Admitting: Physical Therapy

## 2022-08-12 ENCOUNTER — Ambulatory Visit: Payer: Medicare Other | Admitting: Physical Therapy

## 2022-08-16 ENCOUNTER — Ambulatory Visit: Payer: Medicare Other | Admitting: Physical Therapy

## 2022-08-18 ENCOUNTER — Ambulatory Visit: Payer: Medicare Other | Admitting: Physical Therapy

## 2022-08-23 ENCOUNTER — Ambulatory Visit: Payer: Medicare Other | Admitting: Physical Therapy

## 2022-08-25 ENCOUNTER — Ambulatory Visit: Payer: Medicare Other | Admitting: Physical Therapy

## 2022-08-30 ENCOUNTER — Ambulatory Visit: Payer: Medicare Other | Admitting: Physical Therapy

## 2022-09-01 ENCOUNTER — Ambulatory Visit: Payer: Medicare Other | Admitting: Physical Therapy

## 2022-09-06 ENCOUNTER — Ambulatory Visit: Payer: Medicare Other | Admitting: Physical Therapy

## 2022-09-08 ENCOUNTER — Ambulatory Visit: Payer: Medicare Other | Admitting: Physical Therapy

## 2022-09-10 ENCOUNTER — Other Ambulatory Visit: Payer: Self-pay | Admitting: Medical Genetics

## 2022-09-10 DIAGNOSIS — Z006 Encounter for examination for normal comparison and control in clinical research program: Secondary | ICD-10-CM

## 2022-09-13 ENCOUNTER — Ambulatory Visit: Payer: Medicare Other | Admitting: Physical Therapy

## 2022-09-15 ENCOUNTER — Ambulatory Visit: Payer: Medicare Other | Admitting: Physical Therapy

## 2022-09-22 ENCOUNTER — Ambulatory Visit: Payer: Medicare Other | Admitting: Physical Therapy

## 2022-09-27 ENCOUNTER — Ambulatory Visit: Payer: Medicare Other | Admitting: Physical Therapy

## 2022-09-29 ENCOUNTER — Ambulatory Visit: Payer: Medicare Other | Admitting: Physical Therapy

## 2022-10-04 ENCOUNTER — Ambulatory Visit: Payer: Medicare Other | Admitting: Physical Therapy

## 2022-10-06 ENCOUNTER — Ambulatory Visit: Payer: Medicare Other | Admitting: Physical Therapy

## 2022-11-17 ENCOUNTER — Ambulatory Visit: Payer: Medicare Other | Admitting: Dermatology

## 2022-12-03 ENCOUNTER — Encounter: Payer: Self-pay | Admitting: Gastroenterology

## 2023-02-24 ENCOUNTER — Ambulatory Visit: Payer: Medicare Other | Admitting: Dermatology

## 2023-03-08 ENCOUNTER — Ambulatory Visit (INDEPENDENT_AMBULATORY_CARE_PROVIDER_SITE_OTHER): Payer: Medicare Other

## 2023-03-08 VITALS — Ht 74.5 in | Wt 198.0 lb

## 2023-03-08 DIAGNOSIS — Z0000 Encounter for general adult medical examination without abnormal findings: Secondary | ICD-10-CM | POA: Diagnosis not present

## 2023-03-08 NOTE — Patient Instructions (Signed)
Victor Ball , Thank you for taking time to come for your Medicare Wellness Visit. I appreciate your ongoing commitment to your health goals. Please review the following plan we discussed and let me know if I can assist you in the future.   Referrals/Orders/Follow-Ups/Clinician Recommendations: none  This is a list of the screening recommended for you and due dates:  Health Maintenance  Topic Date Due   Flu Shot  08/19/2022   COVID-19 Vaccine (7 - 2024-25 season) 09/19/2022   DTaP/Tdap/Td vaccine (4 - Td or Tdap) 10/25/2022   Medicare Annual Wellness Visit  03/07/2024   Pneumonia Vaccine  Completed   Zoster (Shingles) Vaccine  Completed   HPV Vaccine  Aged Out    Advanced directives: (In Chart) A copy of your advanced directives are scanned into your chart should your provider ever need it.  Next Medicare Annual Wellness Visit scheduled for next year: Yes 03/09/23 @ 8:10am televisit

## 2023-03-08 NOTE — Progress Notes (Signed)
Subjective:   Victor Ball is a 83 y.o. male who presents for Medicare Annual/Subsequent preventive examination.  Visit Complete: Virtual I connected with  Victor Ball on 03/08/23 by a audio enabled telemedicine application and verified that I am speaking with the correct person using two identifiers.  Patient Location: Home  Provider Location: Home Office  I discussed the limitations of evaluation and management by telemedicine. The patient expressed understanding and agreed to proceed.  Vital Signs: Because this visit was a virtual/telehealth visit, some criteria may be missing or patient reported. Any vitals not documented were not able to be obtained and vitals that have been documented are patient reported.  Patient Medicare AWV questionnaire was completed by the patient on 03/04/2023; I have confirmed that all information answered by patient is correct and no changes since this date.  Cardiac Risk Factors include: advanced age (>69men, >16 women);dyslipidemia;hypertension;male gender     Objective:    Today's Vitals   03/08/23 0812  Weight: 198 lb (89.8 kg)  Height: 6' 2.5" (1.892 m)   Body mass index is 25.08 kg/m.     03/08/2023    8:26 AM 06/22/2022    8:10 AM 05/20/2022    2:40 PM 03/03/2022    8:07 AM 03/02/2021   12:05 PM 10/30/2020    8:29 AM 02/29/2020   11:55 AM  Advanced Directives  Does Patient Have a Medical Advance Directive? Yes Yes Yes Yes Yes Yes Yes  Type of Estate agent of [place];Living will Healthcare Power of [place];Living will Healthcare Power of [place];Living will Healthcare Power of [place];Living will Living will;Healthcare Power of [employer] Power of [place];Living will Healthcare Power of [place];Living will  Does patient want to make changes to medical advance directive?     Yes (MAU/Ambulatory/Procedural Areas - Information given)    Copy of Healthcare Power of Attorney in Chart? Yes - validated most  recent copy scanned in chart (See row information)  Yes - validated most recent copy scanned in chart (See row information) No - copy requested   No - copy requested    Current Medications (verified) Outpatient Encounter Medications as of 03/08/2023  Medication Sig   atorvastatin (LIPITOR) 10 MG tablet Take 1 tablet (10 mg total) by mouth daily.   lisinopril (ZESTRIL) 5 MG tablet Take 1 tablet (5 mg total) by mouth daily.   Omega-3 Fatty Acids (OMEGA 3 500 PO) Take by mouth.   No facility-administered encounter medications on file as of 03/08/2023.   Allergies (verified) Patient has no known allergies.   History: Past Medical History:  Diagnosis Date   Duodenal adenoma    duodenal nodule   Hepatitis C, chronic (HCC)    treated prev with Harvoni, tested and cleared   History of kidney stones    passed stone   Hyperlipidemia    Hypertension    Hypothyroidism    Wears glasses    Past Surgical History:  Procedure Laterality Date   APPENDECTOMY  1948   BIOPSY  10/30/2020   Procedure: BIOPSY;  Surgeon: Lemar Lofty., MD;  Location: [facility] ENDOSCOPY;  Service: Gastroenterology;;   COLONOSCOPY WITH PROPOFOL N/A 06/22/2022   Procedure: COLONOSCOPY WITH PROPOFOL;  Surgeon: Toney Reil, MD;  Location: [facility] ENDOSCOPY;  Service: Gastroenterology;  Laterality: N/A;   ENDOSCOPIC MUCOSAL RESECTION N/A 09/10/2019   Procedure: ENDOSCOPIC MUCOSAL RESECTION;  Surgeon: Meridee Score Netty Starring., MD;  Location: [facility] ENDOSCOPY;  Service: Gastroenterology;  Laterality: N/A;   ENTEROSCOPY N/A 10/30/2020  Procedure: ENTEROSCOPY;  Surgeon: Meridee Score Netty Starring., MD;  Location: [facility] ENDOSCOPY;  Service: Gastroenterology;  Laterality: N/A;   ESOPHAGEAL DILATION  09/10/2019   Procedure: ESOPHAGEAL DILATION;  Surgeon: Meridee Score Netty Starring., MD;  Location: [facility] ENDOSCOPY;  Service: Gastroenterology;;   ESOPHAGOGASTRODUODENOSCOPY (EGD) WITH PROPOFOL N/A 06/04/2019   Procedure:  ESOPHAGOGASTRODUODENOSCOPY (EGD) WITH PROPOFOL;  Surgeon: Pasty Spillers, MD;  Location: [facility] ENDOSCOPY;  Service: Endoscopy;  Laterality: N/A;   ESOPHAGOGASTRODUODENOSCOPY (EGD) WITH PROPOFOL N/A 09/10/2019   Procedure: ESOPHAGOGASTRODUODENOSCOPY (EGD) WITH PROPOFOL;  Surgeon: Meridee Score Netty Starring., MD;  Location: [facility] ENDOSCOPY;  Service: Gastroenterology;  Laterality: N/A;   HEMOSTASIS CLIP PLACEMENT  09/10/2019   Procedure: HEMOSTASIS CLIP PLACEMENT;  Surgeon: Lemar Lofty., MD;  Location: [facility] ENDOSCOPY;  Service: Gastroenterology;;   JOINT REPLACEMENT  10/18 and 8/19   LIVER BIOPSY     POLYPECTOMY  09/10/2019   Procedure: POLYPECTOMY;  Surgeon: Meridee Score Netty Starring., MD;  Location: [facility] ENDOSCOPY;  Service: Gastroenterology;;   POLYPECTOMY  10/30/2020   Procedure: POLYPECTOMY;  Surgeon: Lemar Lofty., MD;  Location: [facility] ENDOSCOPY;  Service: Gastroenterology;;   PROSTATE BIOPSY     neg, x2   SUBMUCOSAL LIFTING INJECTION  09/10/2019   Procedure: SUBMUCOSAL LIFTING INJECTION;  Surgeon: Lemar Lofty., MD;  Location: [facility] ENDOSCOPY;  Service: Gastroenterology;;   SUBMUCOSAL TATTOO INJECTION  09/10/2019   Procedure: SUBMUCOSAL TATTOO INJECTION;  Surgeon: Lemar Lofty., MD;  Location: [facility] ENDOSCOPY;  Service: Gastroenterology;;   teeth extractions     TOE FUSION  1985   left great toe   TONSILLECTOMY AND ADENOIDECTOMY  1945   TOTAL HIP ARTHROPLASTY Left 11/09/2016   Procedure: LEFT TOTAL HIP ARTHROPLASTY ANTERIOR APPROACH;  Surgeon: Kathryne Hitch, MD;  Location: [facility] OR;  Service: Orthopedics;  Laterality: Left;   TOTAL HIP ARTHROPLASTY Right 09/13/2017   Procedure: RIGHT TOTAL HIP ARTHROPLASTY ANTERIOR APPROACH;  Surgeon: Kathryne Hitch, MD;  Location: [facility] OR;  Service: Orthopedics;  Laterality: Right;   UPPER ESOPHAGEAL ENDOSCOPIC ULTRASOUND (EUS) N/A 09/10/2019   Procedure: UPPER ESOPHAGEAL ENDOSCOPIC ULTRASOUND (EUS);  Surgeon:  Lemar Lofty., MD;  Location: [facility] ENDOSCOPY;  Service: Gastroenterology;  Laterality: N/A;   VASECTOMY     WISDOM TOOTH EXTRACTION Right 02/26/2021   Family History  Problem Relation Age of Onset   Heart disease Father    Diabetes Father    Heart attack Father    Celiac disease Sister    Lung cancer Sister    Heart disease Brother        MI then stents   Heart attack Brother    Cancer Brother    Diabetes Brother    Stroke Brother    Diabetes Brother    Cancer Paternal Aunt    Cancer Sister    Early death Sister    Early death Brother    Stroke Brother    Prostate cancer Neg Hx    Colon cancer Neg Hx    Esophageal cancer Neg Hx    Inflammatory bowel disease Neg Hx    Liver disease Neg Hx    Pancreatic cancer Neg Hx    Rectal cancer Neg Hx    Stomach cancer Neg Hx    Social History   Socioeconomic History   Marital status: Married    Spouse name: Not on file   Number of children: 2   Years of education: Not on file   Highest education level: Not on file  Occupational History   Occupation: Community education officer  agent---retired    Comment: Hospital malpractice and worker's comp  Tobacco Use   Smoking status: Former    Current packs/day: 0.00    Average packs/day: 1 pack/day for 15.0 years (15.0 ttl pk-yrs)    Types: Cigarettes    Start date: 01/18/1958    Quit date: 01/18/1973    Years since quitting: 50.1   Smokeless tobacco: Never  Vaping Use   Vaping status: Never Used  Substance and Sexual Activity   Alcohol use: Yes    Alcohol/week: 8.0 standard drinks of alcohol    Types: 6 Glasses of wine, 2 Shots of liquor per week    Comment: 1 6 0z drink per day   Drug use: No   Sexual activity: Not Currently    Birth control/protection: Condom  Other Topics Concern   Not on file  Social History Narrative   2 daughters   Married 1961   Partially retired from Community education officer, still consulting some as of 2024      Wife had stomach cancer.        Has living will   Wife,  then daughters equally if wife were incapacitated, has health care POA.   Would accept resuscitation attempts   Not sure about tube feeds   Social Drivers of Health   Financial Resource Strain: Low Risk  (03/08/2023)   Overall Financial Resource Strain (CARDIA)    Difficulty of Paying Living Expenses: Not hard at all  Food Insecurity: No Food Insecurity (03/08/2023)   Hunger Vital Sign    Worried About Running Out of Food in the Last Year: Never true    Ran Out of Food in the Last Year: Never true  Transportation Needs: Unknown (03/08/2023)   PRAPARE - Administrator, Civil Service (Medical): No    Lack of Transportation (Non-Medical): Not on file  Physical Activity: Sufficiently Active (03/08/2023)   Exercise Vital Sign    Days of Exercise per Week: 5 days    Minutes of Exercise per Session: 60 min  Stress: No Stress Concern Present (03/08/2023)   [employer] of Occupational Health - Occupational Stress Questionnaire    Feeling of Stress : Not at all  Social Connections: Socially Integrated (03/08/2023)   Social Connection and Isolation Panel [NHANES]    Frequency of Communication with Friends and Family: More than three times a week    Frequency of Social Gatherings with Friends and Family: More than three times a week    Attends Religious Services: More than 4 times per year    Active Member of [employer] or Organizations: Yes    Attends Engineer, structural: More than 4 times per year    Marital Status: Married    Tobacco Counseling Counseling given: Not Answered  Clinical Intake:  Pre-visit preparation completed: Yes  Pain : No/denies pain    BMI - recorded: 25.08 Nutritional Status: BMI 25 -29 Overweight Nutritional Risks: None Diabetes: No  How often do you need to have someone help you when you read instructions, pamphlets, or other written materials from your doctor or pharmacy?: 1 - Never  Interpreter Needed?: No  Comments: lives with  wife Information entered by :: B.Amry Cathy, LPN   Activities of Daily Living    03/04/2023    2:11 PM  In your present state of health, do you have any difficulty performing the following activities:  Hearing? 0  Vision? 0  Difficulty concentrating or making decisions? 0  Walking or climbing stairs? 0  Dressing or bathing? 0  Doing errands, shopping? 0  Preparing Food and eating ? N  Using the Toilet? N  In the past six months, have you accidently leaked urine? N  Do you have problems with loss of bowel control? N  Managing your Medications? N  Managing your Finances? N  Housekeeping or managing your Housekeeping? N    Patient Care Team: Joaquim Nam, MD as PCP - General (Family Medicine) Kathryne Hitch, MD as Consulting Physician (Orthopedic Surgery) Smitty Cords Michaell Cowing., MD as Referring Physician (Dentistry) Deirdre Evener, MD as Referring Physician (Dermatology) Lawrence, Ritesh, OD (Optometry)  Indicate any recent Medical Services you may have received from other than [facility] providers in the past year (date may be approximate).     Assessment:   This is a routine wellness examination for [place].  Hearing/Vision screen Hearing Screening - Comments:: Pt says his hearing is good Vision Screening - Comments:: Pt says his vision is good w/glasses [facility] [employer] -Dr Ronney Asters   Goals Addressed             This Visit's Progress    Increase physical activity       Continued 03/08/23-I will continue to do Silver Sneakers for 45 minutes 3 days per week, to do cardio for 30 minutes 3 days per week, to play tennis for 2 hours weekly, and to hike as weather permits.      Patient Stated   On track    03/08/23-Would like to maintain current routine      Patient Stated       03/08/23-Would like to continue to travel more.       Depression Screen    03/08/2023    8:20 AM 05/10/2022    8:13 AM 03/03/2022    8:02 AM 03/02/2021   12:10 PM 02/29/2020    11:57 AM 02/27/2019    3:36 PM 02/20/2018    3:29 PM  PHQ 2/9 Scores  PHQ - 2 Score 0 0 0 0 0 0 0  PHQ- 9 Score  0   0 0 0    Fall Risk    03/04/2023    2:11 PM 05/10/2022    8:13 AM 03/03/2022    7:58 AM 03/02/2022   10:32 AM 03/02/2021   12:08 PM  Fall Risk   Falls in the past year? 1 0 0 0 0  Number falls in past yr: 0 0 0  0  Injury with Fall? 0 0 0  0  Risk for fall due to : No Fall Risks No Fall Risks No Fall Risks  No Fall Risks  Follow up Education provided;Falls prevention discussed Falls evaluation completed Falls prevention discussed;Falls evaluation completed  Falls prevention discussed    MEDICARE RISK AT HOME: Medicare Risk at Home If so, are there any without handrails?: (Patient-Rptd) No Home free of loose throw rugs in walkways, pet beds, electrical cords, etc?: (Patient-Rptd) Yes Adequate lighting in your home to reduce risk of falls?: (Patient-Rptd) Yes Life alert?: (Patient-Rptd) No Use of a cane, walker or w/c?: (Patient-Rptd) No Grab bars in the bathroom?: (Patient-Rptd) Yes Shower chair or bench in shower?: (Patient-Rptd) No Elevated toilet seat or a handicapped toilet?: (Patient-Rptd) Yes  TIMED UP AND GO:  Was the test performed?  No    Cognitive Function:    02/29/2020   12:00 PM 02/27/2019    3:38 PM 02/20/2018   11:08 AM 02/18/2017    8:39 AM 02/17/2016  2:15 PM  MMSE - Mini Mental State Exam  Orientation to time 5 5 5 5 5   Orientation to Place 5 5 5 5 5   Registration 3 3 3 3 3   Attention/ Calculation 5 5 0 0 0  Recall 3 3 3 3 3   Language- name 2 objects   0 0 0  Language- repeat 1 1 1 1 1   Language- follow 3 step command   3 3 3   Language- read & follow direction   0 0 0  Write a sentence   0 0 0  Copy design   0 0 0  Total score   20 20 20         03/08/2023    8:29 AM 03/03/2022    8:20 AM  6CIT Screen  What Year? 0 points 0 points  What month? 0 points 0 points  What time? 0 points 0 points  Count back from 20 0 points 0 points   Months in reverse 0 points 0 points  Repeat phrase 0 points 0 points  Total Score 0 points 0 points    Immunizations Immunization History  Administered Date(s) Administered   Fluad Quad(high Dose 65+) 09/15/2018   Influenza Split 10/06/2011   Influenza, High Dose Seasonal PF 10/07/2021   Influenza,inj,Quad PF,6+ Mos 10/04/2012, 12/28/2013, 12/11/2014, 12/10/2015, 10/11/2016, 10/27/2017   Influenza-Unspecified 11/04/2008, 12/02/2010, 10/24/2019, 10/19/2020   Moderna Covid-19 Fall Seasonal Vaccine 36yrs & older 01/01/2022   PFIZER(Purple Top)SARS-COV-2 Vaccination 01/29/2019, 02/19/2019, 10/24/2019, 05/19/2020, 10/17/2020   Pneumococcal Conjugate-13 08/15/2014   Pneumococcal Polysaccharide-23 01/19/2003, 07/15/2003, 06/16/2012   Respiratory Syncytial Virus Vaccine,Recomb Aduvanted(Arexvy) 10/02/2021   Td 07/15/2003, 10/24/2012   Tdap 01/19/2003   Zoster Recombinant(Shingrix) 08/03/2019, 11/25/2019   Zoster, Live 01/19/2007, 10/25/2007    TDAP status: Up to date  Flu Vaccine status: Up to date September 2024 [employer]  Pneumococcal vaccine status: Up to date  Covid-19 vaccine status: Completed vaccines  Qualifies for Shingles Vaccine? Yes   Zostavax completed Yes   Shingrix Completed?: Yes  Screening Tests Health Maintenance  Topic Date Due   INFLUENZA VACCINE  08/19/2022   COVID-19 Vaccine (7 - 2024-25 season) 09/19/2022   DTaP/Tdap/Td (4 - Td or Tdap) 10/25/2022   Medicare Annual Wellness (AWV)  03/07/2024   Pneumonia Vaccine 66+ Years old  Completed   Zoster Vaccines- Shingrix  Completed   HPV VACCINES  Aged Out    Health Maintenance  Health Maintenance Due  Topic Date Due   INFLUENZA VACCINE  08/19/2022   COVID-19 Vaccine (7 - 2024-25 season) 09/19/2022   DTaP/Tdap/Td (4 - Td or Tdap) 10/25/2022    Colorectal cancer screening: No longer required.   Lung Cancer Screening: (Low Dose CT Chest recommended if Age 74-80 years, 20 pack-year currently smoking  OR have quit w/in 15years.) does not qualify.   Lung Cancer Screening Referral: no  Additional Screening:  Hepatitis C Screening: does not qualify; Completed no  Vision Screening: Recommended annual ophthalmology exams for early detection of glaucoma and other disorders of the eye. Is the patient up to date with their annual eye exam?  No  Who is the provider or what is the name of the office in which the patient attends annual eye exams? Dr Massie Kluver If pt is not established with a provider, would they like to be referred to a provider to establish care? No .   Dental Screening: Recommended annual dental exams for proper oral hygiene  Diabetic Foot Exam:   Community Resource Referral /  Chronic Care Management: CRR required this visit?  No   CCM required this visit?  No     Plan:     I have personally reviewed and noted the following in the patient's chart:   Medical and social history Use of alcohol, tobacco or illicit drugs  Current medications and supplements including opioid prescriptions. Patient is not currently taking opioid prescriptions. Functional ability and status Nutritional status Physical activity Advanced directives List of other physicians Hospitalizations, surgeries, and ER visits in previous 12 months Vitals Screenings to include cognitive, depression, and falls Referrals and appointments  In addition, I have reviewed and discussed with patient certain preventive protocols, quality metrics, and best practice recommendations. A written personalized care plan for preventive services as well as general preventive health recommendations were provided to patient.    Sue Lush, LPN   1/61/0960   After Visit Summary: (MyChart) Due to this being a telephonic visit, the after visit summary with patients personalized plan was offered to patient via MyChart   Nurse Notes: The patient states he is doing well and has no concerns or questions at this  time.

## 2023-03-09 ENCOUNTER — Ambulatory Visit: Payer: Medicare Other | Admitting: Dermatology

## 2023-04-18 ENCOUNTER — Ambulatory Visit: Payer: Medicare Other | Admitting: Dermatology

## 2023-04-25 ENCOUNTER — Other Ambulatory Visit: Payer: Self-pay | Admitting: Family Medicine

## 2023-04-25 DIAGNOSIS — I10 Essential (primary) hypertension: Secondary | ICD-10-CM

## 2023-04-25 DIAGNOSIS — Z125 Encounter for screening for malignant neoplasm of prostate: Secondary | ICD-10-CM

## 2023-05-05 ENCOUNTER — Other Ambulatory Visit (INDEPENDENT_AMBULATORY_CARE_PROVIDER_SITE_OTHER): Payer: Medicare Other

## 2023-05-05 DIAGNOSIS — Z125 Encounter for screening for malignant neoplasm of prostate: Secondary | ICD-10-CM | POA: Diagnosis not present

## 2023-05-05 DIAGNOSIS — I10 Essential (primary) hypertension: Secondary | ICD-10-CM | POA: Diagnosis not present

## 2023-05-05 LAB — COMPREHENSIVE METABOLIC PANEL WITH GFR
ALT: 10 U/L (ref 0–53)
AST: 14 U/L (ref 0–37)
Albumin: 4.2 g/dL (ref 3.5–5.2)
Alkaline Phosphatase: 57 U/L (ref 39–117)
BUN: 15 mg/dL (ref 6–23)
CO2: 31 meq/L (ref 19–32)
Calcium: 8.8 mg/dL (ref 8.4–10.5)
Chloride: 104 meq/L (ref 96–112)
Creatinine, Ser: 0.87 mg/dL (ref 0.40–1.50)
GFR: 78.81 mL/min (ref >60.00)
Glucose, Bld: 100 mg/dL — ABNORMAL HIGH (ref 70–99)
Potassium: 4.5 meq/L (ref 3.5–5.1)
Sodium: 141 meq/L (ref 135–145)
Total Bilirubin: 1.2 mg/dL (ref 0.2–1.2)
Total Protein: 6.1 g/dL (ref 6.0–8.3)

## 2023-05-05 LAB — LIPID PANEL
Cholesterol: 107 mg/dL (ref 0–200)
HDL: 45.1 mg/dL (ref >39.00)
LDL Cholesterol: 48 mg/dL (ref 0–99)
NonHDL: 62.23
Total CHOL/HDL Ratio: 2
Triglycerides: 72 mg/dL (ref 0.0–149.0)
VLDL: 14.4 mg/dL (ref 0.0–40.0)

## 2023-05-05 LAB — PSA, MEDICARE: PSA: 11.4 ng/mL — ABNORMAL HIGH (ref 0.10–4.00)

## 2023-05-09 ENCOUNTER — Ambulatory Visit: Payer: Medicare Other | Admitting: Dermatology

## 2023-05-12 ENCOUNTER — Ambulatory Visit (INDEPENDENT_AMBULATORY_CARE_PROVIDER_SITE_OTHER): Payer: Medicare Other | Admitting: Family Medicine

## 2023-05-12 ENCOUNTER — Encounter: Payer: Self-pay | Admitting: Family Medicine

## 2023-05-12 VITALS — BP 138/74 | HR 52 | Temp 97.5°F | Ht 72.84 in | Wt 199.4 lb

## 2023-05-12 DIAGNOSIS — E782 Mixed hyperlipidemia: Secondary | ICD-10-CM | POA: Diagnosis not present

## 2023-05-12 DIAGNOSIS — I10 Essential (primary) hypertension: Secondary | ICD-10-CM | POA: Diagnosis not present

## 2023-05-12 DIAGNOSIS — Z7189 Other specified counseling: Secondary | ICD-10-CM

## 2023-05-12 DIAGNOSIS — R972 Elevated prostate specific antigen [PSA]: Secondary | ICD-10-CM

## 2023-05-12 DIAGNOSIS — Z0000 Encounter for general adult medical examination without abnormal findings: Secondary | ICD-10-CM

## 2023-05-12 MED ORDER — ATORVASTATIN CALCIUM 10 MG PO TABS: 10.0000 mg | ORAL_TABLET | Freq: Every day | ORAL | 3 refills | Status: AC

## 2023-05-12 MED ORDER — LISINOPRIL 5 MG PO TABS
5.0000 mg | ORAL_TABLET | Freq: Every day | ORAL | 3 refills | Status: AC
Start: 2023-05-12 — End: ?

## 2023-05-12 MED ORDER — TAMSULOSIN HCL 0.4 MG PO CAPS: 0.4000 mg | ORAL_CAPSULE | Freq: Every day | ORAL | 3 refills | Status: AC

## 2023-05-12 NOTE — Progress Notes (Signed)
 Hypertension:               Using medication without problems or lightheadedness:  yes Chest pain with exertion:no Edema:no Short of breath:no He is still helping build trails along the [facility].  Still playing tennis.      Elevated Cholesterol: Using medications without problems:yes Muscle aches: not from statin.  Diet compliance: yes Exercise: yes   Living will d/w pt.  Wife designated, then daughters equally if wife were incapacitated, if patient were incapacitated.   Tetanus 2014 PNA up to date Flu 2024 RSV prev done at pharmacy.   Shingrix done.    Covid prev done.   H/o neg prostate biopsy x2.  PSA lower than prev.  Colonoscopy 2024.    He went to PT last year, did ankle exercises and his foot sx resolved.    LUTS d/w pt.  Occ needing to wear a pad.  Some occ dribbling.  PSA lower than prior to still elevated.  Nocturia x1.  Some urinary urgency.    D/w pt about CXR/screening. He had CT with no pleural effusion or pneumothorax. Mild centrilobular emphysema with mild subpleural reticulation at both lung bases. No airspace disease or suspicious pulmonary nodule.  Discussed.  It does not appear that he would be a candidate for other pulmonary screening at this point.  He has previous CT is reassuring.   PMH and SH reviewed   Meds, vitals, and allergies reviewed.    ROS: Per HPI unless specifically indicated in ROS section    GEN: nad, alert and oriented HEENT: ncat NECK: supple w/o LA CV: rrr. PULM: ctab, no inc wob ABD: soft, +bs EXT: no edema SKIN: no acute rash

## 2023-05-12 NOTE — Patient Instructions (Addendum)
 Tetanus shot when possible.   Let me know if flomax  isn't helping.  Take care.  Glad to see you. Thanks for your effort.

## 2023-05-15 NOTE — Assessment & Plan Note (Signed)
Continue atorvastatin.  Continue work on diet and exercise. 

## 2023-05-15 NOTE — Assessment & Plan Note (Signed)
 LUTS d/w pt.  Occ needing to wear a pad.  Some occ dribbling.  PSA lower than prior to still elevated.  Nocturia x1.  Some urinary urgency.   Discussed that he could try Flomax  to see if it help with lower urinary tract symptoms and routine cautions discussed with patient.  He agrees to plan.

## 2023-05-15 NOTE — Assessment & Plan Note (Signed)
Living will d/w pt.  Wife designated, then daughters equally if wife were incapacitated, if patient were incapacitated.   ?

## 2023-05-15 NOTE — Assessment & Plan Note (Signed)
 Living will d/w pt.  Wife designated, then daughters equally if wife were incapacitated, if patient were incapacitated.   Tetanus 2014 PNA up to date Flu 2024 RSV prev done at pharmacy.   Shingrix done.    Covid prev done.   H/o neg prostate biopsy x2.  PSA lower than prev.  Colonoscopy 2024.

## 2023-05-15 NOTE — Assessment & Plan Note (Signed)
Continue lisinopril.  Continue work on diet and exercise. °

## 2023-06-06 ENCOUNTER — Encounter: Payer: Self-pay | Admitting: Dermatology

## 2023-06-06 ENCOUNTER — Ambulatory Visit: Admitting: Dermatology

## 2023-06-06 DIAGNOSIS — D229 Melanocytic nevi, unspecified: Secondary | ICD-10-CM

## 2023-06-06 DIAGNOSIS — D692 Other nonthrombocytopenic purpura: Secondary | ICD-10-CM

## 2023-06-06 DIAGNOSIS — D492 Neoplasm of unspecified behavior of bone, soft tissue, and skin: Secondary | ICD-10-CM | POA: Diagnosis not present

## 2023-06-06 DIAGNOSIS — L814 Other melanin hyperpigmentation: Secondary | ICD-10-CM

## 2023-06-06 DIAGNOSIS — Z1283 Encounter for screening for malignant neoplasm of skin: Secondary | ICD-10-CM | POA: Diagnosis not present

## 2023-06-06 DIAGNOSIS — W908XXA Exposure to other nonionizing radiation, initial encounter: Secondary | ICD-10-CM | POA: Diagnosis not present

## 2023-06-06 DIAGNOSIS — Z85828 Personal history of other malignant neoplasm of skin: Secondary | ICD-10-CM | POA: Insufficient documentation

## 2023-06-06 DIAGNOSIS — D485 Neoplasm of uncertain behavior of skin: Secondary | ICD-10-CM

## 2023-06-06 DIAGNOSIS — D1801 Hemangioma of skin and subcutaneous tissue: Secondary | ICD-10-CM

## 2023-06-06 DIAGNOSIS — Z872 Personal history of diseases of the skin and subcutaneous tissue: Secondary | ICD-10-CM

## 2023-06-06 DIAGNOSIS — L578 Other skin changes due to chronic exposure to nonionizing radiation: Secondary | ICD-10-CM | POA: Diagnosis not present

## 2023-06-06 DIAGNOSIS — L821 Other seborrheic keratosis: Secondary | ICD-10-CM

## 2023-06-06 NOTE — Progress Notes (Signed)
   Follow-Up Visit   Subjective  Victor Ball is a 83 y.o. male who presents for the following: Skin Cancer Screening and Full Body Skin Exam  The patient presents for Total-Body Skin Exam (TBSE) for skin cancer screening and mole check. The patient has spots, moles and lesions to be evaluated, some may be new or changing and the patient may have concern these could be cancer.  Hx AK, no hx skin cancer.   The following portions of the chart were reviewed this encounter and updated as appropriate: medications, allergies, medical history  Review of Systems:  No other skin or systemic complaints except as noted in HPI or Assessment and Plan.  Objective  Well appearing patient in no apparent distress; mood and affect are within normal limits.  A full examination was performed including scalp, head, eyes, ears, nose, lips, neck, chest, axillae, abdomen, back, buttocks, bilateral upper extremities, bilateral lower extremities, hands, feet, fingers, toes, fingernails, and toenails. All findings within normal limits unless otherwise noted below.   Relevant physical exam findings are noted in the Assessment and Plan.  Right Forearm R/o BCC  Left Dorsal Wrist R/o BCC  Left Medial Chest ISK vs BCC   Assessment & Plan   SKIN CANCER SCREENING PERFORMED TODAY.  ACTINIC DAMAGE - Chronic condition, secondary to cumulative UV/sun exposure - diffuse scaly erythematous macules with underlying dyspigmentation - Recommend daily broad spectrum sunscreen SPF 30+ to sun-exposed areas, reapply every 2 hours as needed.  - Staying in the shade or wearing long sleeves, sun glasses (UVA+UVB protection) and wide brim hats (4-inch brim around the entire circumference of the hat) are also recommended for sun protection.  - Call for new or changing lesions.  LENTIGINES, SEBORRHEIC KERATOSES, HEMANGIOMAS - Benign normal skin lesions - Benign-appearing - Call for any changes  MELANOCYTIC NEVI - Tan-brown  and/or pink-flesh-colored symmetric macules and papules - Benign appearing on exam today - Observation - Call clinic for new or changing moles - Recommend daily use of broad spectrum spf 30+ sunscreen to sun-exposed areas.    NEOPLASM OF UNCERTAIN BEHAVIOR OF SKIN (3) Right Forearm Left Dorsal Wrist Left Medial Chest Discussed likely BCC, very low risk of spread, slowly grow, not near delicate structures, not bothering patient. Tx would be EDC or excision given likely nodular morphology. dicussed we can biopsy or we can watch for changes. Patient prefers to watch for changes and defers biopsy today.   HISTORY OF BASAL CELL CARCINOMA   MULTIPLE BENIGN NEVI   LENTIGINES   ACTINIC ELASTOSIS   SEBORRHEIC KERATOSES   CHERRY ANGIOMA   SOLAR PURPURA (HCC)   Return in about 1 year (around 06/05/2024) for TBSE.  Kerstin Peeling, RMA, am acting as scribe for Harris Liming, MD .   Documentation: I have reviewed the above documentation for accuracy and completeness, and I agree with the above.  Harris Liming, MD

## 2023-06-06 NOTE — Patient Instructions (Signed)
 Wound Care Instructions  Cleanse wound gently with soap and water once a day then pat dry with clean gauze. Apply a thin coat of Petrolatum (petroleum jelly, "Vaseline") over the wound (unless you have an allergy to this). We recommend that you use a new, sterile tube of Vaseline. Do not pick or remove scabs. Do not remove the yellow or white "healing tissue" from the base of the wound.  Cover the wound with fresh, clean, nonstick gauze and secure with paper tape. You may use Band-Aids in place of gauze and tape if the wound is small enough, but would recommend trimming much of the tape off as there is often too much. Sometimes Band-Aids can irritate the skin.  You should call the office for your biopsy report after 1 week if you have not already been contacted.  If you experience any problems, such as abnormal amounts of bleeding, swelling, significant bruising, significant pain, or evidence of infection, please call the office immediately.  FOR ADULT SURGERY PATIENTS: If you need something for pain relief you may take 1 extra strength Tylenol (acetaminophen) AND 2 Ibuprofen (200mg  each) together every 4 hours as needed for pain. (do not take these if you are allergic to them or if you have a reason you should not take them.) Typically, you may only need pain medication for 1 to 3 days.   Melanoma ABCDEs  Melanoma is the most dangerous type of skin cancer, and is the leading cause of death from skin disease.  You are more likely to develop melanoma if you: Have light-colored skin, light-colored eyes, or red or blond hair Spend a lot of time in the sun Tan regularly, either outdoors or in a tanning bed Have had blistering sunburns, especially during childhood Have a close family member who has had a melanoma Have atypical moles or large birthmarks  Early detection of melanoma is key since treatment is typically straightforward and cure rates are extremely high if we catch it early.   The  first sign of melanoma is often a change in a mole or a new dark spot.  The ABCDE system is a way of remembering the signs of melanoma.  A for asymmetry:  The two halves do not match. B for border:  The edges of the growth are irregular. C for color:  A mixture of colors are present instead of an even brown color. D for diameter:  Melanomas are usually (but not always) greater than 6mm - the size of a pencil eraser. E for evolution:  The spot keeps changing in size, shape, and color.  Please check your skin once per month between visits. You can use a small mirror in front and a large mirror behind you to keep an eye on the back side or your body.   If you see any new or changing lesions before your next follow-up, please call to schedule a visit.  Please continue daily skin protection including broad spectrum sunscreen SPF 30+ to sun-exposed areas, reapplying every 2 hours as needed when you're outdoors.    Due to recent changes in healthcare laws, you may see results of your pathology and/or laboratory studies on MyChart before the doctors have had a chance to review them. We understand that in some cases there may be results that are confusing or concerning to you. Please understand that not all results are received at the same time and often the doctors may need to interpret multiple results in order to provide you  with the best plan of care or course of treatment. Therefore, we ask that you please give [place] 2 business days to thoroughly review all your results before contacting the office for clarification. Should we see a critical lab result, you will be contacted sooner.   If You Need Anything After Your Visit  If you have any questions or concerns for your doctor, please call our main line at [phone] and press option 4 to reach your doctor's medical assistant. If no one answers, please leave a voicemail as directed and we will return your call as soon as possible. Messages left after 4  pm will be answered the following business day.   You may also send [place] a message via MyChart. We typically respond to MyChart messages within 1-2 business days.  For prescription refills, please ask your pharmacy to contact our office. Our fax number is [phone].  If you have an urgent issue when the clinic is closed that cannot wait until the next business day, you can page your doctor at the number below.    Please note that while we do our best to be available for urgent issues outside of office hours, we are not available 24/7.   If you have an urgent issue and are unable to reach [place], you may choose to seek medical care at your doctor's office, retail clinic, urgent care center, or emergency room.  If you have a medical emergency, please immediately call 911 or go to the emergency department.  Pager Numbers  - Dr. Gwen Pounds: [phone]  - Dr. Roseanne Reno: [phone]  - Dr. Katrinka Blazing: [phone]   In the event of inclement weather, please call our main line at [phone] for an update on the status of any delays or closures.  Dermatology Medication Tips: Please keep the boxes that topical medications come in in order to help keep track of the instructions about where and how to use these. Pharmacies typically print the medication instructions only on the boxes and not directly on the medication tubes.   If your medication is too expensive, please contact our office at [phone] option 4 or send [place] a message through MyChart.   We are unable to tell what your co-pay for medications will be in advance as this is different depending on your insurance coverage. However, we may be able to find a substitute medication at lower cost or fill out paperwork to get insurance to cover a needed medication.   If a prior authorization is required to get your medication covered by your insurance company, please allow [place] 1-2 business days to complete this process.  Drug prices often vary  depending on where the prescription is filled and some pharmacies may offer cheaper prices.  The website www.goodrx.com contains coupons for medications through different pharmacies. The prices here do not account for what the cost may be with help from insurance (it may be cheaper with your insurance), but the website can give you the price if you did not use any insurance.  - You can print the associated coupon and take it with your prescription to the pharmacy.  - You may also stop by our office during regular business hours and pick up a GoodRx coupon card.  - If you need your prescription sent electronically to a different pharmacy, notify our office through [facility] or by phone at [phone] option 4.     Si Usted Necesita Algo Despus de Su Visita  Tambin puede enviarnos un mensaje a  travs de MyChart. Por lo general respondemos a los mensajes de MyChart en el transcurso de 1 a 2 das hbiles.  Para renovar recetas, por favor pida a su farmacia que se ponga en contacto con nuestra oficina. Annie Sable de fax es [place] [phone].  Si tiene un asunto urgente cuando la clnica est cerrada y que no puede esperar hasta el siguiente da hbil, puede llamar/localizar a su doctor(a) al nmero que aparece a continuacin.   Por favor, tenga en cuenta que aunque hacemos todo lo posible para estar disponibles para asuntos urgentes fuera del horario de Edie, no estamos disponibles las 24 horas del da, los 7 [address]  [address] de la [place].   Si tiene un problema urgente y no puede comunicarse con nosotros, puede optar por buscar atencin mdica  en el consultorio de su doctor(a), en una clnica privada, en un centro de atencin urgente o en una sala de emergencias.  Si tiene Engineer, drilling, por favor llame inmediatamente al 911 o vaya a la sala de emergencias.  Nmeros de bper  - Dr. Gwen Pounds: [phone]  - Dra. Roseanne Reno: [phone]  - Dr. Katrinka Blazing: [phone]   En caso de  inclemencias del tiempo, por favor llame a Lacy Duverney principal al [phone] para una actualizacin sobre el [place] de cualquier retraso o cierre.  Consejos para la medicacin en dermatologa: Por favor, guarde las cajas en las que vienen los medicamentos de uso tpico para ayudarle a seguir las instrucciones sobre dnde y cmo usarlos. Las farmacias generalmente imprimen las instrucciones del medicamento slo en las cajas y no directamente en los tubos del [place].   Si su medicamento es muy caro, por favor, pngase en contacto con Rolm Gala llamando al [phone] y presione la opcin 4 o envenos un mensaje a travs de Clinical cytogeneticist.   No podemos decirle cul ser su copago por los medicamentos por adelantado ya que esto es diferente dependiendo de la cobertura de su seguro. Sin embargo, es posible que podamos encontrar un medicamento sustituto a Audiological scientist un formulario para que el seguro cubra el medicamento que se considera necesario.   Si se requiere una autorizacin previa para que su compaa de seguros [place] su medicamento, por favor permtanos de 1 a 2 das hbiles para completar [address]  Los precios de los medicamentos varan con frecuencia dependiendo del Environmental consultant de dnde se surte la receta y alguna farmacias pueden ofrecer precios ms baratos.  El sitio web www.goodrx.com tiene cupones para medicamentos de Health and safety inspector. Los precios aqu no tienen en cuenta lo que podra costar con la ayuda del seguro (puede ser ms barato con su seguro), pero el sitio web puede darle el precio si no utiliz Tourist information centre manager.  - Puede imprimir el cupn correspondiente y llevarlo con su receta a la farmacia.  - Tambin puede pasar por nuestra oficina durante el horario de atencin regular y Education officer, museum una tarjeta de cupones de GoodRx.  - Si necesita que su receta se enve electrnicamente a una farmacia diferente, informe a nuestra oficina a travs de MyChart de [place] o  por telfono llamando al [phone] y presione la opcin 4.

## 2023-08-11 DIAGNOSIS — M1711 Unilateral primary osteoarthritis, right knee: Secondary | ICD-10-CM | POA: Diagnosis not present

## 2023-11-08 ENCOUNTER — Other Ambulatory Visit: Payer: Self-pay | Admitting: Medical Genetics

## 2023-11-08 DIAGNOSIS — Z006 Encounter for examination for normal comparison and control in clinical research program: Secondary | ICD-10-CM

## 2023-12-27 LAB — GENECONNECT MOLECULAR SCREEN: Genetic Analysis Overall Interpretation: NEGATIVE

## 2024-03-08 ENCOUNTER — Ambulatory Visit: Payer: Medicare Other

## 2024-03-09 ENCOUNTER — Ambulatory Visit

## 2024-04-11 ENCOUNTER — Ambulatory Visit: Admitting: Orthopaedic Surgery

## 2024-05-11 ENCOUNTER — Other Ambulatory Visit

## 2024-05-18 ENCOUNTER — Encounter: Admitting: Family Medicine

## 2024-06-18 ENCOUNTER — Ambulatory Visit: Admitting: Dermatology
